# Patient Record
Sex: Male | Born: 1935 | Race: White | Hispanic: No | Marital: Married | Smoking: Current every day smoker
Health system: Southern US, Community
[De-identification: ages and names within clinical notes are randomized; demographics above are authoritative.]

## PROBLEM LIST (undated history)

## (undated) DIAGNOSIS — K209 Esophagitis, unspecified without bleeding: Secondary | ICD-10-CM

## (undated) DIAGNOSIS — R42 Dizziness and giddiness: Secondary | ICD-10-CM

## (undated) DIAGNOSIS — J439 Emphysema, unspecified: Secondary | ICD-10-CM

## (undated) DIAGNOSIS — J302 Other seasonal allergic rhinitis: Secondary | ICD-10-CM

## (undated) DIAGNOSIS — I4729 Other ventricular tachycardia: Secondary | ICD-10-CM

## (undated) DIAGNOSIS — K219 Gastro-esophageal reflux disease without esophagitis: Secondary | ICD-10-CM

## (undated) DIAGNOSIS — I499 Cardiac arrhythmia, unspecified: Secondary | ICD-10-CM

## (undated) DIAGNOSIS — F32A Depression, unspecified: Secondary | ICD-10-CM

## (undated) DIAGNOSIS — F419 Anxiety disorder, unspecified: Secondary | ICD-10-CM

## (undated) DIAGNOSIS — M5126 Other intervertebral disc displacement, lumbar region: Secondary | ICD-10-CM

## (undated) DIAGNOSIS — F4320 Adjustment disorder, unspecified: Secondary | ICD-10-CM

## (undated) DIAGNOSIS — I472 Ventricular tachycardia: Secondary | ICD-10-CM

## (undated) DIAGNOSIS — F329 Major depressive disorder, single episode, unspecified: Secondary | ICD-10-CM

## (undated) DIAGNOSIS — C679 Malignant neoplasm of bladder, unspecified: Secondary | ICD-10-CM

## (undated) HISTORY — PX: COLONOSCOPY: SHX174

## (undated) HISTORY — DX: Major depressive disorder, single episode, unspecified: F32.9

## (undated) HISTORY — DX: Gastro-esophageal reflux disease without esophagitis: K21.9

## (undated) HISTORY — DX: Esophagitis, unspecified without bleeding: K20.90

## (undated) HISTORY — PX: BLADDER SURGERY: SHX569

## (undated) HISTORY — DX: Esophagitis, unspecified: K20.9

## (undated) HISTORY — DX: Depression, unspecified: F32.A

## (undated) HISTORY — DX: Dizziness and giddiness: R42

## (undated) HISTORY — DX: Anxiety disorder, unspecified: F41.9

## (undated) HISTORY — DX: Adjustment disorder, unspecified: F43.20

## (undated) HISTORY — DX: Malignant neoplasm of bladder, unspecified: C67.9

## (undated) HISTORY — PX: VEIN SURGERY: SHX48

---

## 1997-07-31 ENCOUNTER — Ambulatory Visit (HOSPITAL_COMMUNITY): Admission: RE | Admit: 1997-07-31 | Discharge: 1997-07-31 | Payer: Self-pay | Admitting: Urology

## 1998-02-26 ENCOUNTER — Other Ambulatory Visit: Admission: RE | Admit: 1998-02-26 | Discharge: 1998-02-26 | Payer: Self-pay | Admitting: Urology

## 1999-04-06 ENCOUNTER — Other Ambulatory Visit: Admission: RE | Admit: 1999-04-06 | Discharge: 1999-04-06 | Payer: Self-pay | Admitting: Urology

## 1999-04-13 ENCOUNTER — Encounter: Payer: Self-pay | Admitting: Urology

## 1999-04-13 ENCOUNTER — Encounter: Admission: RE | Admit: 1999-04-13 | Discharge: 1999-04-13 | Payer: Self-pay | Admitting: Urology

## 1999-07-27 ENCOUNTER — Encounter: Payer: Self-pay | Admitting: Urology

## 1999-07-27 ENCOUNTER — Encounter: Admission: RE | Admit: 1999-07-27 | Discharge: 1999-07-27 | Payer: Self-pay | Admitting: Urology

## 2005-02-23 ENCOUNTER — Ambulatory Visit (HOSPITAL_COMMUNITY): Admission: RE | Admit: 2005-02-23 | Discharge: 2005-02-23 | Payer: Self-pay | Admitting: Urology

## 2014-12-24 DIAGNOSIS — K5909 Other constipation: Secondary | ICD-10-CM | POA: Diagnosis not present

## 2015-03-05 DIAGNOSIS — Z936 Other artificial openings of urinary tract status: Secondary | ICD-10-CM | POA: Diagnosis not present

## 2015-05-27 DIAGNOSIS — Z79899 Other long term (current) drug therapy: Secondary | ICD-10-CM | POA: Diagnosis not present

## 2015-05-27 DIAGNOSIS — I480 Paroxysmal atrial fibrillation: Secondary | ICD-10-CM | POA: Diagnosis not present

## 2015-05-27 DIAGNOSIS — Z7901 Long term (current) use of anticoagulants: Secondary | ICD-10-CM | POA: Diagnosis not present

## 2015-06-23 DIAGNOSIS — D0421 Carcinoma in situ of skin of right ear and external auricular canal: Secondary | ICD-10-CM | POA: Diagnosis not present

## 2015-06-23 DIAGNOSIS — B351 Tinea unguium: Secondary | ICD-10-CM | POA: Diagnosis not present

## 2015-06-23 DIAGNOSIS — C44329 Squamous cell carcinoma of skin of other parts of face: Secondary | ICD-10-CM | POA: Diagnosis not present

## 2015-06-23 DIAGNOSIS — L570 Actinic keratosis: Secondary | ICD-10-CM | POA: Diagnosis not present

## 2015-07-16 DIAGNOSIS — A932 Colorado tick fever: Secondary | ICD-10-CM | POA: Diagnosis not present

## 2015-07-16 DIAGNOSIS — R5381 Other malaise: Secondary | ICD-10-CM | POA: Diagnosis not present

## 2015-08-14 DIAGNOSIS — Z936 Other artificial openings of urinary tract status: Secondary | ICD-10-CM | POA: Diagnosis not present

## 2015-08-20 DIAGNOSIS — R109 Unspecified abdominal pain: Secondary | ICD-10-CM | POA: Diagnosis not present

## 2015-08-20 DIAGNOSIS — N3001 Acute cystitis with hematuria: Secondary | ICD-10-CM | POA: Diagnosis not present

## 2015-08-20 DIAGNOSIS — R0602 Shortness of breath: Secondary | ICD-10-CM | POA: Diagnosis not present

## 2015-08-20 DIAGNOSIS — R69 Illness, unspecified: Secondary | ICD-10-CM | POA: Diagnosis not present

## 2015-08-20 DIAGNOSIS — R1084 Generalized abdominal pain: Secondary | ICD-10-CM | POA: Diagnosis not present

## 2015-08-20 DIAGNOSIS — S3991XA Unspecified injury of abdomen, initial encounter: Secondary | ICD-10-CM | POA: Diagnosis not present

## 2015-08-20 DIAGNOSIS — S2242XA Multiple fractures of ribs, left side, initial encounter for closed fracture: Secondary | ICD-10-CM | POA: Diagnosis not present

## 2015-09-01 DIAGNOSIS — S2232XS Fracture of one rib, left side, sequela: Secondary | ICD-10-CM | POA: Diagnosis not present

## 2015-09-01 DIAGNOSIS — G4700 Insomnia, unspecified: Secondary | ICD-10-CM | POA: Diagnosis not present

## 2015-09-01 DIAGNOSIS — N390 Urinary tract infection, site not specified: Secondary | ICD-10-CM | POA: Diagnosis not present

## 2015-09-01 DIAGNOSIS — Z1389 Encounter for screening for other disorder: Secondary | ICD-10-CM | POA: Diagnosis not present

## 2015-09-01 DIAGNOSIS — Z9181 History of falling: Secondary | ICD-10-CM | POA: Diagnosis not present

## 2015-09-01 DIAGNOSIS — J40 Bronchitis, not specified as acute or chronic: Secondary | ICD-10-CM | POA: Diagnosis not present

## 2015-09-01 DIAGNOSIS — K219 Gastro-esophageal reflux disease without esophagitis: Secondary | ICD-10-CM | POA: Diagnosis not present

## 2015-09-09 DIAGNOSIS — R079 Chest pain, unspecified: Secondary | ICD-10-CM | POA: Diagnosis not present

## 2015-09-09 DIAGNOSIS — I480 Paroxysmal atrial fibrillation: Secondary | ICD-10-CM | POA: Diagnosis not present

## 2015-09-09 DIAGNOSIS — Z79899 Other long term (current) drug therapy: Secondary | ICD-10-CM | POA: Diagnosis not present

## 2015-09-09 DIAGNOSIS — Z7901 Long term (current) use of anticoagulants: Secondary | ICD-10-CM | POA: Diagnosis not present

## 2015-09-30 DIAGNOSIS — R079 Chest pain, unspecified: Secondary | ICD-10-CM | POA: Diagnosis not present

## 2015-10-01 DIAGNOSIS — R1032 Left lower quadrant pain: Secondary | ICD-10-CM | POA: Diagnosis not present

## 2015-10-01 DIAGNOSIS — R1013 Epigastric pain: Secondary | ICD-10-CM | POA: Diagnosis not present

## 2015-10-01 DIAGNOSIS — K5900 Constipation, unspecified: Secondary | ICD-10-CM | POA: Diagnosis not present

## 2015-10-02 DIAGNOSIS — H401132 Primary open-angle glaucoma, bilateral, moderate stage: Secondary | ICD-10-CM | POA: Diagnosis not present

## 2015-10-02 DIAGNOSIS — H5212 Myopia, left eye: Secondary | ICD-10-CM | POA: Diagnosis not present

## 2015-10-02 DIAGNOSIS — H35373 Puckering of macula, bilateral: Secondary | ICD-10-CM | POA: Diagnosis not present

## 2015-10-09 DIAGNOSIS — K5900 Constipation, unspecified: Secondary | ICD-10-CM | POA: Diagnosis not present

## 2015-10-09 DIAGNOSIS — K621 Rectal polyp: Secondary | ICD-10-CM | POA: Diagnosis not present

## 2015-10-09 DIAGNOSIS — D126 Benign neoplasm of colon, unspecified: Secondary | ICD-10-CM | POA: Diagnosis not present

## 2015-10-09 DIAGNOSIS — R69 Illness, unspecified: Secondary | ICD-10-CM | POA: Diagnosis not present

## 2015-10-09 DIAGNOSIS — K5730 Diverticulosis of large intestine without perforation or abscess without bleeding: Secondary | ICD-10-CM | POA: Diagnosis not present

## 2015-10-09 DIAGNOSIS — Z8551 Personal history of malignant neoplasm of bladder: Secondary | ICD-10-CM | POA: Diagnosis not present

## 2015-10-09 DIAGNOSIS — Z7982 Long term (current) use of aspirin: Secondary | ICD-10-CM | POA: Diagnosis not present

## 2015-10-09 DIAGNOSIS — R634 Abnormal weight loss: Secondary | ICD-10-CM | POA: Diagnosis not present

## 2015-10-09 DIAGNOSIS — D128 Benign neoplasm of rectum: Secondary | ICD-10-CM | POA: Diagnosis not present

## 2015-10-09 DIAGNOSIS — R1310 Dysphagia, unspecified: Secondary | ICD-10-CM | POA: Diagnosis not present

## 2015-10-09 DIAGNOSIS — R1031 Right lower quadrant pain: Secondary | ICD-10-CM | POA: Diagnosis not present

## 2015-10-09 DIAGNOSIS — Z79899 Other long term (current) drug therapy: Secondary | ICD-10-CM | POA: Diagnosis not present

## 2015-10-09 DIAGNOSIS — D124 Benign neoplasm of descending colon: Secondary | ICD-10-CM | POA: Diagnosis not present

## 2015-10-28 DIAGNOSIS — Z0000 Encounter for general adult medical examination without abnormal findings: Secondary | ICD-10-CM | POA: Diagnosis not present

## 2015-10-28 DIAGNOSIS — M549 Dorsalgia, unspecified: Secondary | ICD-10-CM | POA: Diagnosis not present

## 2015-10-28 DIAGNOSIS — N281 Cyst of kidney, acquired: Secondary | ICD-10-CM | POA: Diagnosis not present

## 2015-10-28 DIAGNOSIS — N3090 Cystitis, unspecified without hematuria: Secondary | ICD-10-CM | POA: Diagnosis not present

## 2015-10-28 DIAGNOSIS — Z23 Encounter for immunization: Secondary | ICD-10-CM | POA: Diagnosis not present

## 2015-10-28 DIAGNOSIS — Z720 Tobacco use: Secondary | ICD-10-CM | POA: Diagnosis not present

## 2015-10-30 DIAGNOSIS — N281 Cyst of kidney, acquired: Secondary | ICD-10-CM | POA: Diagnosis not present

## 2015-11-13 DIAGNOSIS — H35373 Puckering of macula, bilateral: Secondary | ICD-10-CM | POA: Diagnosis not present

## 2015-11-13 DIAGNOSIS — H401132 Primary open-angle glaucoma, bilateral, moderate stage: Secondary | ICD-10-CM | POA: Diagnosis not present

## 2015-11-13 DIAGNOSIS — Z961 Presence of intraocular lens: Secondary | ICD-10-CM | POA: Diagnosis not present

## 2015-12-02 DIAGNOSIS — Z79899 Other long term (current) drug therapy: Secondary | ICD-10-CM | POA: Diagnosis not present

## 2015-12-02 DIAGNOSIS — I480 Paroxysmal atrial fibrillation: Secondary | ICD-10-CM | POA: Diagnosis not present

## 2015-12-02 DIAGNOSIS — Z23 Encounter for immunization: Secondary | ICD-10-CM | POA: Diagnosis not present

## 2015-12-04 DIAGNOSIS — H01009 Unspecified blepharitis unspecified eye, unspecified eyelid: Secondary | ICD-10-CM | POA: Diagnosis not present

## 2015-12-25 DIAGNOSIS — H01009 Unspecified blepharitis unspecified eye, unspecified eyelid: Secondary | ICD-10-CM | POA: Diagnosis not present

## 2016-01-27 DIAGNOSIS — J209 Acute bronchitis, unspecified: Secondary | ICD-10-CM | POA: Diagnosis not present

## 2016-02-04 DIAGNOSIS — J329 Chronic sinusitis, unspecified: Secondary | ICD-10-CM | POA: Diagnosis not present

## 2016-02-04 DIAGNOSIS — M1990 Unspecified osteoarthritis, unspecified site: Secondary | ICD-10-CM | POA: Diagnosis not present

## 2016-02-04 DIAGNOSIS — Z0000 Encounter for general adult medical examination without abnormal findings: Secondary | ICD-10-CM | POA: Diagnosis not present

## 2016-02-04 DIAGNOSIS — J40 Bronchitis, not specified as acute or chronic: Secondary | ICD-10-CM | POA: Diagnosis not present

## 2016-06-01 DIAGNOSIS — Z79899 Other long term (current) drug therapy: Secondary | ICD-10-CM | POA: Diagnosis not present

## 2016-06-01 DIAGNOSIS — I480 Paroxysmal atrial fibrillation: Secondary | ICD-10-CM | POA: Diagnosis not present

## 2016-06-23 DIAGNOSIS — H401132 Primary open-angle glaucoma, bilateral, moderate stage: Secondary | ICD-10-CM | POA: Diagnosis not present

## 2016-06-23 DIAGNOSIS — H01009 Unspecified blepharitis unspecified eye, unspecified eyelid: Secondary | ICD-10-CM | POA: Diagnosis not present

## 2016-06-23 DIAGNOSIS — H35373 Puckering of macula, bilateral: Secondary | ICD-10-CM | POA: Diagnosis not present

## 2016-06-23 DIAGNOSIS — Z936 Other artificial openings of urinary tract status: Secondary | ICD-10-CM | POA: Diagnosis not present

## 2016-08-19 ENCOUNTER — Encounter: Payer: Self-pay | Admitting: Cardiology

## 2016-08-23 ENCOUNTER — Encounter: Payer: Self-pay | Admitting: Cardiology

## 2016-08-23 DIAGNOSIS — Z79899 Other long term (current) drug therapy: Secondary | ICD-10-CM | POA: Insufficient documentation

## 2016-08-23 DIAGNOSIS — R079 Chest pain, unspecified: Secondary | ICD-10-CM

## 2016-08-23 DIAGNOSIS — I480 Paroxysmal atrial fibrillation: Secondary | ICD-10-CM

## 2016-08-23 DIAGNOSIS — R0789 Other chest pain: Secondary | ICD-10-CM | POA: Insufficient documentation

## 2016-08-23 HISTORY — DX: Other long term (current) drug therapy: Z79.899

## 2016-08-23 HISTORY — DX: Chest pain, unspecified: R07.9

## 2016-08-23 HISTORY — DX: Paroxysmal atrial fibrillation: I48.0

## 2016-08-24 ENCOUNTER — Ambulatory Visit (INDEPENDENT_AMBULATORY_CARE_PROVIDER_SITE_OTHER): Payer: Medicare HMO | Admitting: Cardiology

## 2016-08-24 ENCOUNTER — Encounter: Payer: Self-pay | Admitting: Cardiology

## 2016-08-24 ENCOUNTER — Ambulatory Visit (HOSPITAL_BASED_OUTPATIENT_CLINIC_OR_DEPARTMENT_OTHER)
Admission: RE | Admit: 2016-08-24 | Discharge: 2016-08-24 | Disposition: A | Discharge status: Home / Self Care | Payer: Medicare HMO | Source: Ambulatory Visit | Attending: Cardiology | Admitting: Cardiology

## 2016-08-24 VITALS — BP 92/68 | HR 62 | Ht 73.0 in | Wt 170.8 lb

## 2016-08-24 DIAGNOSIS — I700 Atherosclerosis of aorta: Secondary | ICD-10-CM | POA: Insufficient documentation

## 2016-08-24 DIAGNOSIS — R079 Chest pain, unspecified: Secondary | ICD-10-CM | POA: Diagnosis not present

## 2016-08-24 DIAGNOSIS — Z79899 Other long term (current) drug therapy: Secondary | ICD-10-CM | POA: Diagnosis not present

## 2016-08-24 DIAGNOSIS — R918 Other nonspecific abnormal finding of lung field: Secondary | ICD-10-CM | POA: Diagnosis not present

## 2016-08-24 DIAGNOSIS — I480 Paroxysmal atrial fibrillation: Secondary | ICD-10-CM | POA: Diagnosis not present

## 2016-08-24 NOTE — Patient Instructions (Addendum)
Medication Instructions:  Your physician recommends that you continue on your current medications as directed. Please refer to the Current Medication list given to you today.   Labwork: Your physician recommends that you return for lab work in: today. Troponin. Dr. Bettina Gavia will call you with these results.   Testing/Procedures: A chest x-ray takes a picture of the organs and structures inside the chest, including the heart, lungs, and blood vessels. This test can show several things, including, whether the heart is enlarges; whether fluid is building up in the lungs; and whether pacemaker / defibrillator leads are still in place.  Your physician has requested that you have a lexiscan myoview. For further information please visit [email or web address]. Please follow instruction sheet, as given.   You will have this done at [facility].    Follow-Up: Your physician recommends that you schedule a follow-up appointment in: 2 weeks.   Any Other Special Instructions Will Be Listed Below (If Applicable).     If you need a refill on your cardiac medications before your next appointment, please call your pharmacy.

## 2016-08-24 NOTE — Progress Notes (Signed)
Cardiology Office Note:    Date:  08/24/2016   ID:  Wayne Mcguire, DOB [birth year], MRN [id number]  PCP:  Wayne Broker, MD  Cardiologist:  Wayne More, MD    Referring MD: No ref. provider found    ASSESSMENT:    1. Chest pain in adult   2. PAF (paroxysmal atrial fibrillation) ([place])   3. High risk medication use    PLAN:    In order of problems listed above:  1. Atypical chest pain undergo quick evaluation including chest x-ray troponin is normal be set up for myocardial perfusion study in the next days. Symptoms worsen or present to the emergency room he has nitroglycerin to take in a when necessary basis and is on low-dose aspirin. 2. Stable continue current antiarrhythmic drug, low-dose if troponin is positive he'll stop the drug immediately  Next appointment: 2 weeks   Medication Adjustments/Labs and Tests Ordered: Current medicines are reviewed at length with the patient today.  Concerns regarding medicines are outlined above.  Orders Placed This Encounter  Procedures  . DG Chest 2 View  . Troponin I  . Myocardial Perfusion Imaging   No orders of the defined types were placed in this encounter.   Chief Complaint  Patient presents with  . Chest Pain    Pt has c/o CP x 1 week    History of Present Illness:    Wayne Mcguire is a 83 y.o. male with a hx of paroxysmal Atrial Fibrillation CHADS2 vasc score of 2 on flecanide last seen 3 months ago. Overall he has done well with tinnitus had little or no episodes of rapid heart rhythm. He has a background history of chest pain and underwent a stress echo in August 2017 that was normal and in the last 1-2 weeks has had Mcguire frequent episodes of nonexertional aching in the left chest and relieved with rest but was relieved when he took nitroglycerin on one instance. The symptoms can last for hours not positional not sharp but he does find himself to be Mcguire short of breath. For further evaluation he'll undergo EKG done  no acute ischemic changes chest x-ray with decreased breath sounds of left chest and ongoing cigarette smoking and a stat troponin to be drawn and called to me. If chest x-ray and troponin are normal he'll be scheduled for myocardial perfusion study. He's had no fever or chills and no chest wall trauma. The chest discomfort does radiate somewhat towards the left shoulder but not into the left arm. He has had no diaphoresis nausea or vomiting Compliance with diet, lifestyle and medications: Yes Past Medical History:  Diagnosis Date  . Adult situational stress disorder   . Anxiety   . Bladder cancer ([place])   . Depression   . Esophagitis   . GERD (gastroesophageal reflux disease)     Past Surgical History:  Procedure Laterality Date  . BLADDER SURGERY    . VEIN SURGERY      Current Medications: Current Meds  Medication Sig  . aspirin EC 81 MG tablet Take 81 mg by mouth daily.  . flecainide (TAMBOCOR) 50 MG tablet Take 50 mg by mouth 2 (two) times daily.  . meloxicam (MOBIC) 15 MG tablet Take 15 mg by mouth daily.  . nitroGLYCERIN (NITROSTAT) 0.4 MG SL tablet Place 0.4 mg under the tongue daily as needed.  . pantoprazole (PROTONIX) 40 MG tablet Take 40 mg by mouth daily.  . traZODone (DESYREL) 150 MG tablet Take 150  mg by mouth daily.     Allergies:   Patient has no known allergies.   Social History   Social History  . Marital status: Married    Spouse name: N/A  . Number of children: N/A  . Years of education: N/A   Social History Main Topics  . Smoking status: Current Every Day Smoker    Packs/day: 1.00    Types: Cigarettes  . Smokeless tobacco: Former Systems developer    Types: Chew  . Alcohol use None  . Drug use: Unknown  . Sexual activity: Not Asked   Other Topics Concern  . None   Social History Narrative  . None     Family History: The patient's family history includes Heart attack in his brother. ROS:   Please see the history of present illness.    All other  systems reviewed and are negative.  EKGs/Labs/Other Studies Reviewed:    The following studies were reviewed today:   EKG:  EKG is  ordered today.  The ekg ordered today demonstrates abnormal R wave progression V2 to v3 no acute ischemic changes    Physical Exam:    VS:  BP 92/68   Pulse 62   Ht 6\' 1"  (1.854 m)   Wt 170 lb 12.8 oz (77.5 kg)   SpO2 96%   BMI 22.53 kg/m     Wt Readings from Last 3 Encounters:  08/24/16 170 lb 12.8 oz (77.5 kg)     GEN:  Well nourished, well developed in no acute distress HEENT: Normal NECK: No JVD; No carotid bruits LYMPHATICS: No lymphadenopathy CARDIAC: RRR, no murmurs, rubs, gallops RESPIRATORY:  Decreased BS l chest  ABDOMEN: Soft, non-tender, non-distended MUSCULOSKELETAL:  No edema; No deformity  SKIN: Warm and dry NEUROLOGIC:  Alert and oriented x 3 PSYCHIATRIC:  Normal affect    Signed, Wayne More, MD  08/24/2016 4:30 PM    [facility]

## 2016-08-25 LAB — TROPONIN T: TROPONIN T TROPT: 0.01 ng/mL (ref <0.011)

## 2016-08-31 ENCOUNTER — Encounter: Payer: Self-pay | Admitting: Cardiology

## 2016-08-31 DIAGNOSIS — R079 Chest pain, unspecified: Secondary | ICD-10-CM | POA: Diagnosis not present

## 2016-09-07 ENCOUNTER — Ambulatory Visit (INDEPENDENT_AMBULATORY_CARE_PROVIDER_SITE_OTHER): Payer: Medicare HMO | Admitting: Cardiology

## 2016-09-07 VITALS — BP 92/54 | HR 62 | Ht 73.0 in | Wt 174.0 lb

## 2016-09-07 DIAGNOSIS — Z79899 Other long term (current) drug therapy: Secondary | ICD-10-CM | POA: Diagnosis not present

## 2016-09-07 DIAGNOSIS — I480 Paroxysmal atrial fibrillation: Secondary | ICD-10-CM

## 2016-09-07 DIAGNOSIS — R0789 Other chest pain: Secondary | ICD-10-CM

## 2016-09-07 DIAGNOSIS — R071 Chest pain on breathing: Secondary | ICD-10-CM | POA: Diagnosis not present

## 2016-09-07 DIAGNOSIS — R079 Chest pain, unspecified: Secondary | ICD-10-CM | POA: Diagnosis not present

## 2016-09-07 HISTORY — DX: Other chest pain: R07.89

## 2016-09-07 NOTE — Patient Instructions (Signed)
Medication Instructions:  Your physician recommends that you continue on your current medications as directed. Please refer to the Current Medication list given to you today.   Labwork: None  Testing/Procedures: You had an EKG today.   Follow-Up: Your physician wants you to follow-up in: 3 months. You will receive a reminder letter in the mail two months in advance. If you don't receive a letter, please call our office to schedule the follow-up appointment.   Any Other Special Instructions Will Be Listed Below (If Applicable).     If you need a refill on your cardiac medications before your next appointment, please call your pharmacy.   

## 2016-09-07 NOTE — Progress Notes (Signed)
Cardiology Office Note:    Date:  09/07/2016   ID:  Wayne Mcguire, DOB 04/12/1932, MRN [id number]  PCP:  [name], MD  Cardiologist:  Shirlee More, MD    Referring MD: [name], MD    ASSESSMENT:    1. Costochondral chest pain   2. PAF (paroxysmal atrial fibrillation) ([place])   3. High risk medication use   4. Chest pain in adult    PLAN:    In order of problems listed above:  1. Improved, he is intolerant of nonsteroidals will take Tylenol twice daily and will observe his clinical pattern. 2. Stable no recurrence continue his antiarrhythmic drug and aspirin. 3. Stable no evidence of flecainide toxicity   Next appointment: 3 months   Medication Adjustments/Labs and Tests Ordered: Current medicines are reviewed at length with the patient today.  Concerns regarding medicines are outlined above.  Orders Placed This Encounter  Procedures  . EKG 12-Lead   No orders of the defined types were placed in this encounter.   Chief Complaint  Patient presents with  . Atrial Fibrillation  . Chest Pain    Pt had recent stress test at [facility]  . Leg Swelling    left foot and ankle    History of Present Illness:    Wayne Mcguire is a 83 y.o. male with a hx of paroxysmal Atrial Fibrillation CHADS2 vasc score of 2 on flecanide  last seen 72months ago. He was seen in the office for chest pain troponin was normal chest x-ray unremarkable and stress echo was normal. He continues to have infrequent sharp localized left costochondral pain nonanginal in nature and is associated scoliosis and pectus excavatum deformity. He remains apprehensive regarding underlying ischemic heart disease and workup undertaken approach clinical observation and reassessment in 3 months. His symptoms more typical he require further evaluation such as coronary angiography. He's had no breakthrough episodes of rapid heart rhythm shortness of breath palpitation or syncope. Compliance with diet,  lifestyle and medications: Yes Past Medical History:  Diagnosis Date  . Adult situational stress disorder   . Anxiety   . Bladder cancer ([place])   . Depression   . Esophagitis   . GERD (gastroesophageal reflux disease)     Past Surgical History:  Procedure Laterality Date  . BLADDER SURGERY    . VEIN SURGERY      Current Medications: Current Meds  Medication Sig  . aspirin EC 81 MG tablet Take 81 mg by mouth daily.  . flecainide (TAMBOCOR) 50 MG tablet Take 50 mg by mouth 2 (two) times daily.  . nitroGLYCERIN (NITROSTAT) 0.4 MG SL tablet Place 0.4 mg under the tongue every 5 (five) minutes as needed for chest pain.   . pantoprazole (PROTONIX) 40 MG tablet Take 40 mg by mouth daily.  . traZODone (DESYREL) 150 MG tablet Take 150 mg by mouth daily.     Allergies:   Patient has no known allergies.   Social History   Social History  . Marital status: Married    Spouse name: N/A  . Number of children: N/A  . Years of education: N/A   Social History Main Topics  . Smoking status: Current Every Day Smoker    Packs/day: 1.00    Types: Cigarettes  . Smokeless tobacco: Former Systems developer    Types: Chew  . Alcohol use Not on file  . Drug use: Unknown  . Sexual activity: Not on file   Other Topics Concern  .  Not on file   Social History Narrative  . No narrative on file     Family History: The patient's family history includes Heart attack in his brother. ROS:   Please see the history of present illness.    All other systems reviewed and are negative.  EKGs/Labs/Other Studies Reviewed:    The following studies were reviewed today:  EKG:  EKG ordered today.  The ekg ordered today demonstrates [place] normal Stress echo recently without ischemia. Recent Labs: No results found for requested labs within last 8760 hours.  Recent Lipid Panel No results found for: CHOL, TRIG, HDL, CHOLHDL, VLDL, LDLCALC, LDLDIRECT  Physical Exam:    VS:  BP (!) 92/54   Pulse 62   Ht 6\' 1"   (1.854 m)   Wt 174 lb (78.9 kg)   SpO2 94%   BMI 22.96 kg/m     Wt Readings from Last 3 Encounters:  09/07/16 174 lb (78.9 kg)  08/24/16 170 lb 12.8 oz (77.5 kg)     GEN:  Well nourished, well developed in no acute distress HEENT: Normal NECK: No JVD; No carotid bruits LYMPHATICS: No lymphadenopathy CARDIAC: RRR, no murmurs, rubs, gallops RESPIRATORY:  Clear to auscultation without rales, wheezing or rhonchi  ABDOMEN: Soft, non-tender, non-distended MUSCULOSKELETAL:  No edema; No deformity  SKIN: Warm and dry NEUROLOGIC:  Alert and oriented x 3 PSYCHIATRIC:  Normal affect    Signed, Shirlee More, MD  09/07/2016 4:54 PM    [facility]

## 2016-09-09 DIAGNOSIS — I480 Paroxysmal atrial fibrillation: Secondary | ICD-10-CM | POA: Diagnosis not present

## 2016-09-09 DIAGNOSIS — F5104 Psychophysiologic insomnia: Secondary | ICD-10-CM

## 2016-09-09 DIAGNOSIS — F419 Anxiety disorder, unspecified: Secondary | ICD-10-CM

## 2016-09-09 DIAGNOSIS — B351 Tinea unguium: Secondary | ICD-10-CM | POA: Diagnosis not present

## 2016-09-09 DIAGNOSIS — R69 Illness, unspecified: Secondary | ICD-10-CM | POA: Diagnosis not present

## 2016-09-09 DIAGNOSIS — K219 Gastro-esophageal reflux disease without esophagitis: Secondary | ICD-10-CM

## 2016-09-09 HISTORY — DX: Anxiety disorder, unspecified: F41.9

## 2016-09-09 HISTORY — DX: Psychophysiologic insomnia: F51.04

## 2016-09-09 HISTORY — DX: Gastro-esophageal reflux disease without esophagitis: K21.9

## 2016-09-21 ENCOUNTER — Ambulatory Visit: Payer: Medicare HMO | Admitting: Cardiology

## 2016-09-29 DIAGNOSIS — W57XXXA Bitten or stung by nonvenomous insect and other nonvenomous arthropods, initial encounter: Secondary | ICD-10-CM | POA: Diagnosis not present

## 2016-09-29 DIAGNOSIS — B351 Tinea unguium: Secondary | ICD-10-CM | POA: Diagnosis not present

## 2016-09-29 DIAGNOSIS — S70361A Insect bite (nonvenomous), right thigh, initial encounter: Secondary | ICD-10-CM | POA: Diagnosis not present

## 2016-10-05 ENCOUNTER — Encounter: Payer: Self-pay | Admitting: Cardiology

## 2016-10-05 ENCOUNTER — Ambulatory Visit (INDEPENDENT_AMBULATORY_CARE_PROVIDER_SITE_OTHER): Payer: Medicare HMO | Admitting: Cardiology

## 2016-10-05 VITALS — BP 120/56 | HR 60 | Resp 12 | Ht 73.0 in | Wt 176.8 lb

## 2016-10-05 DIAGNOSIS — I480 Paroxysmal atrial fibrillation: Secondary | ICD-10-CM

## 2016-10-05 DIAGNOSIS — H5213 Myopia, bilateral: Secondary | ICD-10-CM | POA: Diagnosis not present

## 2016-10-05 DIAGNOSIS — H35373 Puckering of macula, bilateral: Secondary | ICD-10-CM | POA: Diagnosis not present

## 2016-10-05 DIAGNOSIS — H401132 Primary open-angle glaucoma, bilateral, moderate stage: Secondary | ICD-10-CM | POA: Diagnosis not present

## 2016-10-05 MED ORDER — NITROGLYCERIN 0.4 MG SL SUBL: 0.4000 mg | SUBLINGUAL_TABLET | SUBLINGUAL | 1 refills | Status: DC | PRN

## 2016-10-05 MED ORDER — APIXABAN 5 MG PO TABS: 5.0000 mg | ORAL_TABLET | Freq: Two times a day (BID) | ORAL | 2 refills | Status: DC

## 2016-10-05 NOTE — Progress Notes (Signed)
Cardiology Office Note:    Date:  10/05/2016   ID:  Wayne Mcguire, DOB 01/19/1933, MRN [id number]  PCP:  [name], MD  Cardiologist:  Shirlee More, MD    Referring MD: [name], MD    ASSESSMENT:    1. PAF (paroxysmal atrial fibrillation) ([facility])    PLAN:    In order of problems listed above:  1. Clinically he is having breakthrough episodes of atrial fibrillation associated with antifungal therapy. He's agreed to accept anticoagulation at least over the intermediate term continue flecainide and assess response. All   Next appointment: 6 weeks   Medication Adjustments/Labs and Tests Ordered: Current medicines are reviewed at length with the patient today.  Concerns regarding medicines are outlined above.  Orders Placed This Encounter  Procedures  . EKG 12-Lead   Meds ordered this encounter  Medications  . nitroGLYCERIN (NITROSTAT) 0.4 MG SL tablet    Sig: Place 1 tablet (0.4 mg total) under the tongue every 5 (five) minutes as needed for chest pain.    Dispense:  25 tablet    Refill:  1  . apixaban (ELIQUIS) 5 MG TABS tablet    Sig: Take 1 tablet (5 mg total) by mouth 2 (two) times daily.    Dispense:  60 tablet    Refill:  2    Chief Complaint  Patient presents with  . Chest Pain  . Leg Swelling  . Irregular heart rate    History of Present Illness:    Wayne Mcguire is a 83 y.o. male with a hx of PAF  last seen last month .He was started on terbinafine and was in AF for 2 weeks.During that time he felt badly complaints of shortness of breath and exercise intolerance and chest pain have resolved.  Compliance with diet, lifestyle and medications: Yes  Past Medical History:  Diagnosis Date  . Adult situational stress disorder   . Anxiety   . Bladder cancer ([place])   . Depression   . Esophagitis   . GERD (gastroesophageal reflux disease)     Past Surgical History:  Procedure Laterality Date  . BLADDER SURGERY    . VEIN SURGERY       Current Medications: Current Meds  Medication Sig  . flecainide (TAMBOCOR) 50 MG tablet Take 50 mg by mouth 2 (two) times daily.  . meloxicam (MOBIC) 15 MG tablet Take 15 mg by mouth daily.  . nitroGLYCERIN (NITROSTAT) 0.4 MG SL tablet Place 1 tablet (0.4 mg total) under the tongue every 5 (five) minutes as needed for chest pain.  . pantoprazole (PROTONIX) 40 MG tablet Take 40 mg by mouth daily.  . traZODone (DESYREL) 150 MG tablet Take 150 mg by mouth daily.  . [DISCONTINUED] aspirin EC 81 MG tablet Take 81 mg by mouth daily.  . [DISCONTINUED] nitroGLYCERIN (NITROSTAT) 0.4 MG SL tablet Place 0.4 mg under the tongue every 5 (five) minutes as needed for chest pain.      Allergies:   Terbinafine and related   Social History   Social History  . Marital status: Married    Spouse name: N/A  . Number of children: N/A  . Years of education: N/A   Social History Main Topics  . Smoking status: Current Every Day Smoker    Packs/day: 0.25    Types: Cigarettes  . Smokeless tobacco: Never Used     Comment: Down to 5 cigs a day  . Alcohol use No  . Drug use:  No  . Sexual activity: Not Asked   Other Topics Concern  . None   Social History Narrative  . None     Family History: The patient's family history includes Heart attack in his brother; Heart failure in his father. ROS:   Please see the history of present illness.    All other systems reviewed and are negative.  EKGs/Labs/Other Studies Reviewed:    The following studies were reviewed today:  EKG:  EKG ordered today.  The ekg ordered today demonstrates Sinus rhythm normal  Recent Labs: No results found for requested labs within last 8760 hours.  Recent Lipid Panel No results found for: CHOL, TRIG, HDL, CHOLHDL, VLDL, LDLCALC, LDLDIRECT  Physical Exam:    VS:  BP (!) 120/56   Pulse 60   Resp 12   Ht 6\' 1"  (1.854 m)   Wt 176 lb 12.8 oz (80.2 kg)   BMI 23.33 kg/m     Wt Readings from Last 3 Encounters:   10/05/16 176 lb 12.8 oz (80.2 kg)  09/07/16 174 lb (78.9 kg)  08/24/16 170 lb 12.8 oz (77.5 kg)     GEN:  Well nourished, well developed in no acute distress HEENT: Normal NECK: No JVD; No carotid bruits LYMPHATICS: No lymphadenopathy CARDIAC: RRR, no murmurs, rubs, gallops RESPIRATORY:  Clear to auscultation without rales, wheezing or rhonchi  ABDOMEN: Soft, non-tender, non-distended MUSCULOSKELETAL:  Left lower extremity 1+ edema; No deformity  SKIN: Warm and dry NEUROLOGIC:  Alert and oriented x 3 PSYCHIATRIC:  Normal affect    Signed, Shirlee More, MD  10/05/2016 12:14 PM    [facility]

## 2016-10-05 NOTE — Patient Instructions (Addendum)
Medication Instructions:  Your physician has recommended you make the following change in your medication:  STOP aspirin START apixaban (Eliquis) 5 mg twice daily.   Labwork: None  Testing/Procedures: None  Follow-Up: Your physician recommends that you schedule a follow-up appointment in: 6 weeks.   Any Other Special Instructions Will Be Listed Below (If Applicable).     If you need a refill on your cardiac medications before your next appointment, please call your pharmacy.

## 2016-10-18 DIAGNOSIS — R8271 Bacteriuria: Secondary | ICD-10-CM | POA: Diagnosis not present

## 2016-10-18 DIAGNOSIS — R369 Urethral discharge, unspecified: Secondary | ICD-10-CM | POA: Diagnosis not present

## 2016-10-18 DIAGNOSIS — N509 Disorder of male genital organs, unspecified: Secondary | ICD-10-CM | POA: Diagnosis not present

## 2016-11-09 DIAGNOSIS — Z8551 Personal history of malignant neoplasm of bladder: Secondary | ICD-10-CM | POA: Diagnosis not present

## 2016-11-15 DIAGNOSIS — Z7901 Long term (current) use of anticoagulants: Secondary | ICD-10-CM

## 2016-11-15 HISTORY — DX: Long term (current) use of anticoagulants: Z79.01

## 2016-11-15 NOTE — Progress Notes (Signed)
Cardiology Office Note:    Date:  11/16/2016   ID:  Wayne Mcguire, DOB 02/18/1933, MRN [id number]  PCP:  [name], MD  Cardiologist:  Shirlee More, MD    Referring MD: [name], MD    ASSESSMENT:    1. PAF (paroxysmal atrial fibrillation) ([place])   2. High risk medication use   3. Chronic anticoagulation    PLAN:    In order of problems listed above:  1. Stable continue low-dose flecainide and anticoagulant 2. Stable continue his antiarrhythmic drug 3. Continue his current anticoagulant   Next appointment: 6 months   Medication Adjustments/Labs and Tests Ordered: Current medicines are reviewed at length with the patient today.  Concerns regarding medicines are outlined above.  No orders of the defined types were placed in this encounter.  No orders of the defined types were placed in this encounter.   Chief Complaint  Patient presents with  . Follow-up    6 week flup appt  . Shortness of Breath  . Edema    left foot/leg-better since last visit   . Atrial Fibrillation    History of Present Illness:    Wayne Mcguire is a 83 y.o. male with a hx of PAF and costochondral chest pain  last seen 6 weeks ago.he is compliant with medications lpleased with the quality of his life and is had no further episodes of atrial fibrillation, chest pain or syncope Compliance with diet, lifestyle and medications:  yes Past Medical History:  Diagnosis Date  . Adult situational stress disorder   . Anxiety   . Bladder cancer ([place])   . Depression   . Esophagitis   . GERD (gastroesophageal reflux disease)     Past Surgical History:  Procedure Laterality Date  . BLADDER SURGERY    . VEIN SURGERY      Current Medications: Current Meds  Medication Sig  . apixaban (ELIQUIS) 5 MG TABS tablet Take 1 tablet (5 mg total) by mouth 2 (two) times daily.  . ciclopirox (PENLAC) 8 % solution APPLY TOPICALLY NIGHTLY FOR 7 DAYS APPLY OVER NAIL AND SURROUNDING AREAS  .  flecainide (TAMBOCOR) 50 MG tablet Take 50 mg by mouth 2 (two) times daily.  . nitroGLYCERIN (NITROSTAT) 0.4 MG SL tablet Place 1 tablet (0.4 mg total) under the tongue every 5 (five) minutes as needed for chest pain.  . pantoprazole (PROTONIX) 40 MG tablet Take 40 mg by mouth daily.  . traZODone (DESYREL) 150 MG tablet Take 150 mg by mouth daily.     Allergies:   Terbinafine and related   Social History   Social History  . Marital status: Married    Spouse name: N/A  . Number of children: N/A  . Years of education: N/A   Social History Main Topics  . Smoking status: Current Every Day Smoker    Packs/day: 0.25    Types: Cigarettes  . Smokeless tobacco: Never Used     Comment: Down to 5 cigs a day  . Alcohol use No  . Drug use: No  . Sexual activity: Not Asked   Other Topics Concern  . None   Social History Narrative  . None     Family History: The patient's family history includes Heart attack in his brother; Heart failure in his father. ROS:   Please see the history of present illness.    All other systems reviewed and are negative.  EKGs/Labs/Other Studies Reviewed:    The following studies  were reviewed today:  EKG:  EKG ordered today.  The ekg ordered today demonstrates [place] 53 BPM normal  Recent Labs: No results found for requested labs within last 8760 hours.  Recent Lipid Panel No results found for: CHOL, TRIG, HDL, CHOLHDL, VLDL, LDLCALC, LDLDIRECT  Physical Exam:    VS:  BP 96/60   Pulse (!) 53   Ht 6\' 1"  (1.854 m)   Wt 174 lb 1.3 oz (79 kg)   SpO2 97%   BMI 22.97 kg/m     Wt Readings from Last 3 Encounters:  11/16/16 174 lb 1.3 oz (79 kg)  10/05/16 176 lb 12.8 oz (80.2 kg)  09/07/16 174 lb (78.9 kg)     GEN:  Well nourished, well developed in no acute distress HEENT: Normal NECK: No JVD; No carotid bruits LYMPHATICS: No lymphadenopathy CARDIAC: RRR, no murmurs, rubs, gallops RESPIRATORY:  Clear to auscultation without rales, wheezing or  rhonchi  ABDOMEN: Soft, non-tender, non-distended MUSCULOSKELETAL:  No edema; No deformity  SKIN: Warm and dry NEUROLOGIC:  Alert and oriented x 3 PSYCHIATRIC:  Normal affect    Signed, Shirlee More, MD  11/16/2016 3:06 PM    [facility]

## 2016-11-16 ENCOUNTER — Encounter: Payer: Self-pay | Admitting: Cardiology

## 2016-11-16 ENCOUNTER — Ambulatory Visit (INDEPENDENT_AMBULATORY_CARE_PROVIDER_SITE_OTHER): Payer: Medicare HMO | Admitting: Cardiology

## 2016-11-16 VITALS — BP 96/60 | HR 53 | Ht 73.0 in | Wt 174.1 lb

## 2016-11-16 DIAGNOSIS — Z7901 Long term (current) use of anticoagulants: Secondary | ICD-10-CM | POA: Diagnosis not present

## 2016-11-16 DIAGNOSIS — Z79899 Other long term (current) drug therapy: Secondary | ICD-10-CM

## 2016-11-16 DIAGNOSIS — I480 Paroxysmal atrial fibrillation: Secondary | ICD-10-CM | POA: Diagnosis not present

## 2016-11-16 NOTE — Patient Instructions (Signed)

## 2016-11-23 DIAGNOSIS — C679 Malignant neoplasm of bladder, unspecified: Secondary | ICD-10-CM | POA: Diagnosis not present

## 2016-11-23 DIAGNOSIS — K5730 Diverticulosis of large intestine without perforation or abscess without bleeding: Secondary | ICD-10-CM | POA: Diagnosis not present

## 2016-12-20 DIAGNOSIS — C679 Malignant neoplasm of bladder, unspecified: Secondary | ICD-10-CM | POA: Diagnosis not present

## 2016-12-20 DIAGNOSIS — D381 Neoplasm of uncertain behavior of trachea, bronchus and lung: Secondary | ICD-10-CM | POA: Diagnosis not present

## 2016-12-22 DIAGNOSIS — Z23 Encounter for immunization: Secondary | ICD-10-CM | POA: Diagnosis not present

## 2017-01-06 DIAGNOSIS — R042 Hemoptysis: Secondary | ICD-10-CM

## 2017-01-06 DIAGNOSIS — Z0000 Encounter for general adult medical examination without abnormal findings: Secondary | ICD-10-CM | POA: Diagnosis not present

## 2017-01-06 DIAGNOSIS — R911 Solitary pulmonary nodule: Secondary | ICD-10-CM | POA: Diagnosis not present

## 2017-01-06 DIAGNOSIS — R300 Dysuria: Secondary | ICD-10-CM

## 2017-01-06 DIAGNOSIS — J439 Emphysema, unspecified: Secondary | ICD-10-CM | POA: Diagnosis not present

## 2017-01-06 HISTORY — DX: Dysuria: R30.0

## 2017-01-06 HISTORY — DX: Hemoptysis: R04.2

## 2017-01-24 ENCOUNTER — Other Ambulatory Visit: Payer: Self-pay | Admitting: Cardiology

## 2017-01-24 DIAGNOSIS — I480 Paroxysmal atrial fibrillation: Secondary | ICD-10-CM

## 2017-02-17 DIAGNOSIS — Z936 Other artificial openings of urinary tract status: Secondary | ICD-10-CM | POA: Diagnosis not present

## 2017-08-08 ENCOUNTER — Other Ambulatory Visit: Payer: Self-pay | Admitting: Cardiology

## 2017-08-08 NOTE — Progress Notes (Signed)
Cardiology Office Note:    Date:  08/22/2017   ID:  Wayne Mcguire, DOB 1932-05-02, MRN [id number]  PCP:  [name], MD  Cardiologist:  Shirlee More, MD    Referring MD: [name], MD    ASSESSMENT:    1. PAF (paroxysmal atrial fibrillation) ([place])   2. High risk medication use   3. Costochondral chest pain    PLAN:    In order of problems listed above:  1. Stable he will continue very low-dose dose antiarrhythmic drug flecainide with no evidence of toxicity in his current anticoagulant.  EKG is stable today with sinus rhythm and a single APC 2. Stable continue low-dose flecainide I do not feel he needs a blood level.  I will check a CMP for potassium and liver function to screen for toxicity 3. Stable no recurrence   Next appointment: 6 months   Medication Adjustments/Labs and Tests Ordered: Current medicines are reviewed at length with the patient today.  Concerns regarding medicines are outlined above.  No orders of the defined types were placed in this encounter.  No orders of the defined types were placed in this encounter.   Chief Complaint  Patient presents with  . Follow-up  . Atrial Fibrillation  . Anticoagulation    History of Present Illness:     Past Medical History:  Diagnosis Date  . Adult situational stress disorder   . Anxiety   . Bladder cancer ([place])   . Depression   . Esophagitis   . GERD (gastroesophageal reflux disease)     Past Surgical History:  Procedure Laterality Date  . BLADDER SURGERY    . VEIN SURGERY      Current Medications: Current Meds  Medication Sig  . acetaminophen (TYLENOL 8 HOUR) 650 MG CR tablet Take 650 mg by mouth 2 (two) times daily.  Marland Kitchen ELIQUIS 5 MG TABS tablet TAKE ONE (1) TABLET BY MOUTH TWO (2) TIMES DAILY  . flecainide (TAMBOCOR) 50 MG tablet TAKE 1 TABLET BY MOUTH TWICE DAILY  . nitroGLYCERIN (NITROSTAT) 0.4 MG SL tablet Place 1 tablet (0.4 mg total) under the tongue every 5 (five) minutes  as needed for chest pain.  . traZODone (DESYREL) 150 MG tablet Take 150 mg by mouth daily.     Allergies:   Terbinafine and related   Social History   Socioeconomic History  . Marital status: Married    Spouse name: Not on file  . Number of children: Not on file  . Years of education: Not on file  . Highest education level: Not on file  Occupational History  . Not on file  Social Needs  . Financial resource strain: Not on file  . Food insecurity:    Worry: Not on file    Inability: Not on file  . Transportation needs:    Medical: Not on file    Non-medical: Not on file  Tobacco Use  . Smoking status: Current Every Day Smoker    Packs/day: 0.25    Types: Cigarettes  . Smokeless tobacco: Never Used  . Tobacco comment: Down to 5 cigs a day  Substance and Sexual Activity  . Alcohol use: No  . Drug use: No  . Sexual activity: Not on file  Lifestyle  . Physical activity:    Days per week: Not on file    Minutes per session: Not on file  . Stress: Not on file  Relationships  . Social connections:    Talks on  phone: Not on file    Gets together: Not on file    Attends religious service: Not on file    Active member of club or organization: Not on file    Attends meetings of clubs or organizations: Not on file    Relationship status: Not on file  Other Topics Concern  . Not on file  Social History Narrative  . Not on file     Family History: The patient's family history includes Heart attack in his brother; Heart failure in his father. ROS:   Please see the history of present illness.    All other systems reviewed and are negative.  EKGs/Labs/Other Studies Reviewed:    The following studies were reviewed today:  EKG:  EKG ordered today.  The ekg ordered today demonstrates [place] 1 APC  Recent Labs: No results found for requested labs within last 8760 hours.  Recent Lipid Panel No results found for: CHOL, TRIG, HDL, CHOLHDL, VLDL, LDLCALC, LDLDIRECT  Physical  Exam:    VS:  BP 114/68 (BP Location: Right Arm, Patient Position: Sitting, Cuff Size: Normal)   Pulse (!) 56   Ht 6\' 1"  (1.854 m)   Wt 171 lb (77.6 kg)   SpO2 97%   BMI 22.56 kg/m     Wt Readings from Last 3 Encounters:  08/22/17 171 lb (77.6 kg)  11/16/16 174 lb 1.3 oz (79 kg)  10/05/16 176 lb 12.8 oz (80.2 kg)     GEN: Well nourished, well developed in no acute distress HEENT: Normal NECK: No JVD; No carotid bruits LYMPHATICS: No lymphadenopathy CARDIAC: RRR, no murmurs, rubs, gallops RESPIRATORY:  Clear to auscultation without rales, wheezing or rhonchi  ABDOMEN: Soft, non-tender, non-distended MUSCULOSKELETAL:  No edema; No deformity  SKIN: Warm and dry NEUROLOGIC:  Alert and oriented x 3 PSYCHIATRIC:  Normal affect    Signed, Shirlee More, MD  08/22/2017 1:31 PM    [facility]

## 2017-08-22 ENCOUNTER — Ambulatory Visit (INDEPENDENT_AMBULATORY_CARE_PROVIDER_SITE_OTHER): Payer: Medicare HMO | Admitting: Cardiology

## 2017-08-22 ENCOUNTER — Encounter: Payer: Self-pay | Admitting: Cardiology

## 2017-08-22 VITALS — BP 114/68 | HR 56 | Ht 73.0 in | Wt 171.0 lb

## 2017-08-22 DIAGNOSIS — I480 Paroxysmal atrial fibrillation: Secondary | ICD-10-CM | POA: Diagnosis not present

## 2017-08-22 DIAGNOSIS — R071 Chest pain on breathing: Secondary | ICD-10-CM

## 2017-08-22 DIAGNOSIS — Z79899 Other long term (current) drug therapy: Secondary | ICD-10-CM

## 2017-08-22 DIAGNOSIS — R0789 Other chest pain: Secondary | ICD-10-CM

## 2017-08-22 MED ORDER — FLECAINIDE ACETATE 50 MG PO TABS: 50.0000 mg | ORAL_TABLET | Freq: Two times a day (BID) | ORAL | 0 refills | Status: DC

## 2017-08-22 MED ORDER — APIXABAN 5 MG PO TABS: 5.0000 mg | ORAL_TABLET | Freq: Two times a day (BID) | ORAL | 3 refills | Status: DC

## 2017-08-22 NOTE — Patient Instructions (Signed)
Medication Instructions:  Your physician recommends that you continue on your current medications as directed. Please refer to the Current Medication list given to you today.   Labwork: Your physician recommends that you have the following labs drawn: CMP  Testing/Procedures: You had an EKG today.  Follow-Up: Your physician wants you to follow-up in: 6 months.  You will receive a reminder letter in the mail two months in advance. If you don't receive a letter, please call our office to schedule the follow-up appointment.   Any Other Special Instructions Will Be Listed Below (If Applicable).     If you need a refill on your cardiac medications before your next appointment, please call your pharmacy.

## 2017-08-23 LAB — COMPREHENSIVE METABOLIC PANEL
ALBUMIN: 4.3 g/dL (ref 3.5–4.7)
ALT: 18 IU/L (ref 0–44)
AST: 23 IU/L (ref 0–40)
Albumin/Globulin Ratio: 2 (ref 1.2–2.2)
Alkaline Phosphatase: 91 IU/L (ref 39–117)
BUN / CREAT RATIO: 15 (ref 10–24)
BUN: 14 mg/dL (ref 8–27)
Bilirubin Total: <0.2 mg/dL (ref 0.0–1.2)
CO2: 25 mmol/L (ref 20–29)
Calcium: 9.7 mg/dL (ref 8.6–10.2)
Chloride: 104 mmol/L (ref 96–106)
Creatinine, Ser: 0.95 mg/dL (ref 0.76–1.27)
GFR calc Af Amer: 85 mL/min/{1.73_m2} (ref >59)
GFR calc non Af Amer: 74 mL/min/{1.73_m2} (ref >59)
GLOBULIN, TOTAL: 2.2 g/dL (ref 1.5–4.5)
GLUCOSE: 86 mg/dL (ref 65–99)
Potassium: 4.1 mmol/L (ref 3.5–5.2)
SODIUM: 140 mmol/L (ref 134–144)
TOTAL PROTEIN: 6.5 g/dL (ref 6.0–8.5)

## 2017-09-02 ENCOUNTER — Telehealth: Payer: Self-pay | Admitting: Cardiology

## 2017-09-02 NOTE — Telephone Encounter (Signed)
Informed wife of the lab results and clarified where privileges are.

## 2017-09-02 NOTE — Telephone Encounter (Signed)
Patient is asking if Dr. Has privileges at [facility] in case they go there.

## 2017-09-02 NOTE — Telephone Encounter (Signed)
Please call patient with lab results

## 2017-09-30 DIAGNOSIS — IMO0001 Reserved for inherently not codable concepts without codable children: Secondary | ICD-10-CM

## 2017-09-30 DIAGNOSIS — R5381 Other malaise: Secondary | ICD-10-CM

## 2017-09-30 DIAGNOSIS — E559 Vitamin D deficiency, unspecified: Secondary | ICD-10-CM

## 2017-09-30 DIAGNOSIS — R911 Solitary pulmonary nodule: Secondary | ICD-10-CM

## 2017-09-30 DIAGNOSIS — D3500 Benign neoplasm of unspecified adrenal gland: Secondary | ICD-10-CM | POA: Insufficient documentation

## 2017-09-30 DIAGNOSIS — R5383 Other fatigue: Secondary | ICD-10-CM

## 2017-09-30 DIAGNOSIS — Z0000 Encounter for general adult medical examination without abnormal findings: Secondary | ICD-10-CM

## 2017-09-30 HISTORY — DX: Other malaise: R53.81

## 2017-09-30 HISTORY — DX: Benign neoplasm of unspecified adrenal gland: D35.00

## 2017-09-30 HISTORY — DX: Other fatigue: R53.83

## 2017-09-30 HISTORY — DX: Reserved for inherently not codable concepts without codable children: IMO0001

## 2017-09-30 HISTORY — DX: Encounter for general adult medical examination without abnormal findings: Z00.00

## 2017-09-30 HISTORY — DX: Vitamin D deficiency, unspecified: E55.9

## 2017-09-30 HISTORY — DX: Solitary pulmonary nodule: R91.1

## 2017-10-04 ENCOUNTER — Telehealth: Payer: Self-pay | Admitting: Cardiology

## 2017-10-04 NOTE — Telephone Encounter (Signed)
Patient has swelling  In left foot and ankle and o other issues and he states that he also has a headache. I suggestd that he call his PCP but she insist on talking to Nurse.Marland Kitchen

## 2017-10-04 NOTE — Telephone Encounter (Signed)
Per the wife the patient had swelling in his left leg yesterday and his leg "gave out on him." Informed the wife that we might possibly be able to have [facility] see him in [facility]. The wife states that the patient has appointments the rest of the week with doctor's and would not be able to come in. Per the wife the patient's symptoms are resolving; she will observe for any worsening. Instructed to call us or her PCP with any further questions or concerns.

## 2017-10-06 DIAGNOSIS — G8929 Other chronic pain: Secondary | ICD-10-CM

## 2017-10-06 DIAGNOSIS — M25562 Pain in left knee: Secondary | ICD-10-CM

## 2017-10-06 DIAGNOSIS — M7062 Trochanteric bursitis, left hip: Secondary | ICD-10-CM

## 2017-10-06 DIAGNOSIS — M25561 Pain in right knee: Secondary | ICD-10-CM | POA: Insufficient documentation

## 2017-10-06 HISTORY — DX: Other chronic pain: G89.29

## 2017-10-06 HISTORY — DX: Trochanteric bursitis, left hip: M70.62

## 2017-10-06 HISTORY — DX: Pain in left knee: M25.562

## 2017-12-01 ENCOUNTER — Telehealth: Payer: Self-pay | Admitting: Cardiology

## 2017-12-01 NOTE — Telephone Encounter (Signed)
Fell in the donut hole and wants samples of eliquis

## 2017-12-01 NOTE — Telephone Encounter (Signed)
Wife will come tomorrow for samples.

## 2017-12-02 NOTE — Telephone Encounter (Signed)
Samples have been given to the patient. 

## 2018-02-20 ENCOUNTER — Telehealth: Payer: Self-pay | Admitting: Cardiology

## 2018-02-20 DIAGNOSIS — I480 Paroxysmal atrial fibrillation: Secondary | ICD-10-CM

## 2018-02-20 NOTE — Telephone Encounter (Signed)
Wants samples of eliquis and flecinide

## 2018-02-21 MED ORDER — APIXABAN 5 MG PO TABS: 5.0000 mg | ORAL_TABLET | Freq: Two times a day (BID) | ORAL | 0 refills | Status: DC

## 2018-02-21 MED ORDER — FLECAINIDE ACETATE 50 MG PO TABS: 50.0000 mg | ORAL_TABLET | Freq: Two times a day (BID) | ORAL | 0 refills | Status: DC

## 2018-02-21 NOTE — Telephone Encounter (Signed)
Patient's wife, Jana Half, per DPR is requesting a refill on flecainide and eliquis as patient will not have enough of these medications to get him to his follow up appointment on 03/10/2018 at 4:20 with Dr. Bettina Gavia. Refills sent to [facility] as requested. No further questions.

## 2018-02-21 NOTE — Addendum Note (Signed)
Addended by: Austin Miles on: 02/21/2018 09:16 AM   Modules accepted: Orders

## 2018-02-21 NOTE — Telephone Encounter (Signed)
Left message for patient to return call. Eliquis 5 mg samples are only available in the [employer] office.

## 2018-03-10 ENCOUNTER — Ambulatory Visit (INDEPENDENT_AMBULATORY_CARE_PROVIDER_SITE_OTHER): Payer: Medicare HMO | Admitting: Cardiology

## 2018-03-10 ENCOUNTER — Encounter: Payer: Self-pay | Admitting: Cardiology

## 2018-03-10 VITALS — BP 94/60 | HR 59 | Ht 73.0 in | Wt 170.8 lb

## 2018-03-10 DIAGNOSIS — Z7901 Long term (current) use of anticoagulants: Secondary | ICD-10-CM | POA: Diagnosis not present

## 2018-03-10 DIAGNOSIS — I9589 Other hypotension: Secondary | ICD-10-CM

## 2018-03-10 DIAGNOSIS — Z79899 Other long term (current) drug therapy: Secondary | ICD-10-CM | POA: Diagnosis not present

## 2018-03-10 DIAGNOSIS — E861 Hypovolemia: Secondary | ICD-10-CM

## 2018-03-10 DIAGNOSIS — I959 Hypotension, unspecified: Secondary | ICD-10-CM

## 2018-03-10 DIAGNOSIS — I480 Paroxysmal atrial fibrillation: Secondary | ICD-10-CM | POA: Diagnosis not present

## 2018-03-10 HISTORY — DX: Hypotension, unspecified: I95.9

## 2018-03-10 MED ORDER — APIXABAN 5 MG PO TABS: 5.0000 mg | ORAL_TABLET | Freq: Two times a day (BID) | ORAL | 1 refills | Status: DC

## 2018-03-10 MED ORDER — FLECAINIDE ACETATE 50 MG PO TABS: 50.0000 mg | ORAL_TABLET | Freq: Two times a day (BID) | ORAL | 1 refills | Status: DC

## 2018-03-10 MED ORDER — NITROGLYCERIN 0.4 MG SL SUBL: 0.4000 mg | SUBLINGUAL_TABLET | SUBLINGUAL | 5 refills | Status: DC | PRN

## 2018-03-10 NOTE — Patient Instructions (Signed)

## 2018-03-10 NOTE — Progress Notes (Signed)
Cardiology Office Note:    Date:  03/10/2018   ID:  Wayne Mcguire, DOB [birth year], MRN [id number]  PCP:  [name], MD  Cardiologist:  Shirlee More, MD    Referring MD: [name], MD    ASSESSMENT:    1. PAF (paroxysmal atrial fibrillation) ([place])   2. Chronic anticoagulation   3. High risk medication use   4. Hypotension due to hypovolemia    PLAN:    In order of problems listed above:  1. Stable he is maintaining sinus rhythm on low-dose flecainide and will continue his anticoagulation. 2. Continue his current anticoagulant 3. Continue flecainide no evidence of toxicity 4. 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   Next appointment: 6 months   Medication Adjustments/Labs and Tests Ordered: Current medicines are reviewed at length with the patient today.  Concerns regarding medicines are outlined above.  No orders of the defined types were placed in this encounter.  No orders of the defined types were placed in this encounter.   Chief Complaint  Patient presents with  . Atrial Fibrillation    History of Present Illness:    Wayne Mcguire is a 85 y.o. male with a hx of PAF on flecanide last seen 08/22/17. Compliance with diet, lifestyle and medications: Yes Past Medical History:  Diagnosis Date  . Adult situational stress disorder   . Anxiety   . Bladder cancer ([place])   . Depression   . Esophagitis   . GERD (gastroesophageal reflux disease)   . Vertigo     Past Surgical History:  Procedure Laterality Date  . BLADDER SURGERY    . VEIN SURGERY      Current Medications: Current Meds  Medication Sig  . acetaminophen (TYLENOL 8 HOUR) 650 MG CR tablet Take 650 mg by mouth 2 (two) times daily.  Marland Kitchen apixaban (ELIQUIS) 5 MG TABS tablet Take 1 tablet (5 mg total) by mouth  2 (two) times daily.  Marland Kitchen CRANBERRY PO Take 1 tablet by mouth daily.  Marland Kitchen Dextromethorphan-guaiFENesin (MUCINEX DM PO) Take 1 tablet by mouth 2 (two) times daily.  . flecainide (TAMBOCOR) 50 MG tablet Take 1 tablet (50 mg total) by mouth 2 (two) times daily.  . Multiple Vitamin (MULTIVITAMIN) tablet Take 1 tablet by mouth daily.  . Multiple Vitamins-Minerals (ICAPS AREDS 2 PO) Take 1 capsule by mouth daily.  . Multiple Vitamins-Minerals (OSTEO COMPLEX PO) Take 1 tablet by mouth daily.  . nitroGLYCERIN (NITROSTAT) 0.4 MG SL tablet Place 1 tablet (0.4 mg total) under the tongue every 5 (five) minutes as needed for chest pain.  . Probiotic Product (PROBIOTIC DAILY) CAPS Take 1 capsule by mouth daily.  . traZODone (DESYREL) 150 MG tablet Take 150 mg by mouth daily.  . vitamin B-12 (CYANOCOBALAMIN) 1000 MCG tablet Take 1,000 mcg by mouth daily.     Allergies:   Terbinafine and related   Social History   Socioeconomic History  . Marital status: Married    Spouse name: Not on file  . Number of children: Not on file  . Years of education: Not on file  . Highest education level: Not on file  Occupational History  . Not on file  Social Needs  . Financial resource strain: Not on file  . Food insecurity:    Worry: Not on file  Inability: Not on file  . Transportation needs:    Medical: Not on file    Non-medical: Not on file  Tobacco Use  . Smoking status: Current Every Day Smoker    Packs/day: 0.25    Types: Cigarettes  . Smokeless tobacco: Former Systems developer    Types: Chew  . Tobacco comment: Down to 5 cigs a day  Substance and Sexual Activity  . Alcohol use: No  . Drug use: No  . Sexual activity: Not on file  Lifestyle  . Physical activity:    Days per week: Not on file    Minutes per session: Not on file  . Stress: Not on file  Relationships  . Social connections:    Talks on phone: Not on file    Gets together: Not on file    Attends religious service: Not on file    Active  member of club or organization: Not on file    Attends meetings of clubs or organizations: Not on file    Relationship status: Not on file  Other Topics Concern  . Not on file  Social History Narrative  . Not on file     Family History: The patient's family history includes Heart attack in his brother; Heart failure in his father. ROS:   Please see the history of present illness.    All other systems reviewed and are negative.  EKGs/Labs/Other Studies Reviewed:    The following studies were reviewed today:  EKG:  EKG ordered today.  The ekg ordered today demonstrates shows sinus rhythm occasional APCs normal conduction intervals  Recent Labs: 08/22/2017: ALT 18; BUN 14; Creatinine, Ser 0.95; Potassium 4.1; Sodium 140  Recent Lipid Panel No results found for: CHOL, TRIG, HDL, CHOLHDL, VLDL, LDLCALC, LDLDIRECT  Physical Exam:    VS:  BP 94/60 (BP Location: Right Arm, Patient Position: Sitting, Cuff Size: Normal)   Pulse (!) 59   Ht 6\' 1"  (1.854 m)   Wt 170 lb 12.8 oz (77.5 kg)   SpO2 98%   BMI 22.53 kg/m     Wt Readings from Last 3 Encounters:  03/10/18 170 lb 12.8 oz (77.5 kg)  08/22/17 171 lb (77.6 kg)  11/16/16 174 lb 1.3 oz (79 kg)     GEN:  Well nourished, well developed in no acute distress HEENT: Normal NECK: No JVD; No carotid bruits LYMPHATICS: No lymphadenopathy CARDIAC: RRR, no murmurs, rubs, gallops RESPIRATORY:  Clear to auscultation without rales, wheezing or rhonchi  ABDOMEN: Soft, non-tender, non-distended MUSCULOSKELETAL:  No edema; No deformity  SKIN: Warm and dry NEUROLOGIC:  Alert and oriented x 3 PSYCHIATRIC:  Normal affect    Signed, Shirlee More, MD  03/10/2018 4:59 PM    [facility]

## 2018-04-03 DIAGNOSIS — R2681 Unsteadiness on feet: Secondary | ICD-10-CM

## 2018-04-03 DIAGNOSIS — R42 Dizziness and giddiness: Secondary | ICD-10-CM | POA: Insufficient documentation

## 2018-04-03 HISTORY — DX: Unsteadiness on feet: R26.81

## 2018-08-06 NOTE — Progress Notes (Signed)
Virtual Visit via Telephone Note   This visit type was conducted due to national recommendations for restrictions regarding the COVID-19 Pandemic (e.g. social distancing) in an effort to limit this patient's exposure and mitigate transmission in our community.  Due to his co-morbid illnesses, this patient is at least at moderate risk for complications without adequate follow up.  This format is felt to be most appropriate for this patient at this time.  The patient did not have access to video technology/had technical difficulties with video requiring transitioning to audio format only (telephone).  All issues noted in this document were discussed and addressed.  No physical exam could be performed with this format.  Please refer to the patient's chart for his  consent to telehealth for [facility].   Date:  08/07/2018   ID:  Wayne Mcguire, DOB [birth year], MRN [id number]  Patient Location: Home Provider Location: Office  PCP:  [name], MD  Cardiologist:  Shirlee More, MD  Electrophysiologist:  None   Evaluation Performed:  Follow-Up Visit  Chief Complaint:  PAF  History of Present Illness:    Wayne Mcguire is a 85 y.o. male with a hx of PAF on flecanide last seen  03/10/18.  The patient does not have symptoms concerning for COVID-19 infection (fever, chills, cough, or new shortness of breath).   Unfortunately has no access to a smart phone or Internet.  Several months ago he had palpitation but not in the last few months has no side effects from his medication and no bleeding complication of his anti-coagulant.  No edema chest pain or shortness of breath.  Recent labs performed with his PCP 04/03/2018 shows a normal CMP potassium 4.6 creatinine 0.93 hemoglobin 14.5 TSH 3.02 and free T4 and free T3 were normal. Past Medical History:  Diagnosis Date  . Adult situational stress disorder   . Anxiety   . Bladder cancer ([place])   . Depression   . Esophagitis   . GERD  (gastroesophageal reflux disease)   . Vertigo    Past Surgical History:  Procedure Laterality Date  . BLADDER SURGERY    . VEIN SURGERY       Current Meds  Medication Sig  . acetaminophen (TYLENOL 8 HOUR) 650 MG CR tablet Take 650 mg by mouth 2 (two) times daily.  Marland Kitchen apixaban (ELIQUIS) 5 MG TABS tablet Take 1 tablet (5 mg total) by mouth 2 (two) times daily.  Marland Kitchen CRANBERRY PO Take 1 tablet by mouth daily.  Marland Kitchen Dextromethorphan-guaiFENesin (MUCINEX DM PO) Take 1 tablet by mouth 2 (two) times daily.  . flecainide (TAMBOCOR) 50 MG tablet Take 1 tablet (50 mg total) by mouth 2 (two) times daily.  . Multiple Vitamin (MULTIVITAMIN) tablet Take 1 tablet by mouth daily.  . Multiple Vitamins-Minerals (ICAPS AREDS 2 PO) Take 1 capsule by mouth daily.  . Multiple Vitamins-Minerals (OSTEO COMPLEX PO) Take 1 tablet by mouth daily.  . nitroGLYCERIN (NITROSTAT) 0.4 MG SL tablet Place 1 tablet (0.4 mg total) under the tongue every 5 (five) minutes as needed for chest pain.  . Probiotic Product (PROBIOTIC DAILY) CAPS Take 1 capsule by mouth daily.  . traZODone (DESYREL) 150 MG tablet Take 150 mg by mouth daily.     Allergies:   Terbinafine and related   Social History   Tobacco Use  . Smoking status: Former Smoker    Packs/day: 1.00    Years: 70.00    Pack years: 70.00    Types: Cigarettes  Quit date: 04/2018    Years since quitting: 0.2  . Smokeless tobacco: Former Systems developer    Types: Chew  Substance Use Topics  . Alcohol use: No  . Drug use: No     Family Hx: The patient's family history includes Heart attack in his brother; Heart failure in his father.  ROS:   Please see the history of present illness.     All other systems reviewed and are negative.   Prior CV studies:   The following studies were reviewed today:    Labs/Other Tests and Data Reviewed:    EKG:  An ECG dated 03/10/18 was personally reviewed today and demonstrated:  [place] normal  Recent Labs: 08/22/2017: ALT 18;  BUN 14; Creatinine, Ser 0.95; Potassium 4.1; Sodium 140   Recent Lipid Panel No results found for: CHOL, TRIG, HDL, CHOLHDL, LDLCALC, LDLDIRECT  Wt Readings from Last 3 Encounters:  08/07/18 172 lb (78 kg)  03/10/18 170 lb 12.8 oz (77.5 kg)  08/22/17 171 lb (77.6 kg)     Objective:    Vital Signs:  BP 108/67 (BP Location: Left Arm, Patient Position: Sitting)   Pulse (!) 55   Wt 172 lb (78 kg)   BMI 22.69 kg/m    VITAL SIGNS:  reviewed mod affect thought and cognition are normal no respiratory distress  ASSESSMENT & PLAN:    1. Atrial fibrillation paroxysmal stable continue his anticoagulant low-dose antiarrhythmic drug and is not taking any rate suppressant medication.  I will see him sooner in 3 months to do an office EKG to assess his QRS interval for toxicity. 2.  Anticoagulation stable no bleeding complications continue Eliquis 3. High risk medication, flecainide stable on a very low dose will check EKG at a visit in 3 months to screen for toxicity          COVID-19 Education: The signs and symptoms of COVID-19 were discussed with the patient and how to seek care for testing (follow up with PCP or arrange E-visit).  The importance of social distancing was discussed today.  Time:   Today, I have spent 20 minutes with the patient with telehealth technology discussing the above problems.     Medication Adjustments/Labs and Tests Ordered: Current medicines are reviewed at length with the patient today.  Concerns regarding medicines are outlined above.   Tests Ordered: No orders of the defined types were placed in this encounter.   Medication Changes: No orders of the defined types were placed in this encounter.   Follow Up:  In Person in 3 month(s)  Signed, Shirlee More, MD  08/07/2018 4:08 PM    [facility]

## 2018-08-07 ENCOUNTER — Telehealth (INDEPENDENT_AMBULATORY_CARE_PROVIDER_SITE_OTHER): Payer: Medicare HMO | Admitting: Cardiology

## 2018-08-07 ENCOUNTER — Other Ambulatory Visit: Payer: Self-pay

## 2018-08-07 ENCOUNTER — Encounter: Payer: Self-pay | Admitting: Cardiology

## 2018-08-07 ENCOUNTER — Telehealth: Payer: Self-pay | Admitting: Cardiology

## 2018-08-07 VITALS — BP 108/67 | HR 55 | Wt 172.0 lb

## 2018-08-07 DIAGNOSIS — Z7901 Long term (current) use of anticoagulants: Secondary | ICD-10-CM

## 2018-08-07 DIAGNOSIS — Z79899 Other long term (current) drug therapy: Secondary | ICD-10-CM

## 2018-08-07 DIAGNOSIS — I480 Paroxysmal atrial fibrillation: Secondary | ICD-10-CM | POA: Diagnosis not present

## 2018-08-07 MED ORDER — FLECAINIDE ACETATE 50 MG PO TABS: 50.0000 mg | ORAL_TABLET | Freq: Two times a day (BID) | ORAL | 1 refills | Status: DC

## 2018-08-07 MED ORDER — APIXABAN 5 MG PO TABS: 5.0000 mg | ORAL_TABLET | Freq: Two times a day (BID) | ORAL | 1 refills | Status: DC

## 2018-08-07 NOTE — Patient Instructions (Addendum)
Medication Instructions:  Your physician recommends that you continue on your current medications as directed. Please refer to the Current Medication list given to you today.  If you need a refill on your cardiac medications before your next appointment, please call your pharmacy.   Lab work: NOne If you have labs (blood work) drawn today and your tests are completely normal, you will receive your results only by: Marland Kitchen MyChart Message (if you have MyChart) OR . A paper copy in the mail If you have any lab test that is abnormal or we need to change your treatment, we will call you to review the results.  Testing/Procedures: NOne  Follow-Up: At [facility], you and your health needs are our priority.  As part of our continuing mission to provide you with exceptional heart care, we have created designated Provider Care Teams.  These Care Teams include your primary Cardiologist (physician) and Advanced Practice Providers (APPs -  Physician Assistants and Nurse Practitioners) who all work together to provide you with the care you need, when you need it. You will need a follow up appointment in 3 months.   Any Other Special Instructions Will Be Listed Below (If Applicable).

## 2018-08-07 NOTE — Telephone Encounter (Signed)
Virtual Visit Pre-Appointment Phone Call  "(Name), I am calling you today to discuss your upcoming appointment. We are currently trying to limit exposure to the virus that causes COVID-19 by seeing patients at home rather than in the office."  1. "What is the BEST phone number to call the day of the visit?" - include this in appointment notes  2. Do you have or have access to (through a family member/friend) a smartphone with video capability that we can use for your visit?" a. If yes - list this number in appt notes as cell (if different from BEST phone #) and list the appointment type as a VIDEO visit in appointment notes b. If no - list the appointment type as a PHONE visit in appointment notes  3. Confirm consent - "In the setting of the current Covid19 crisis, you are scheduled for a (phone or video) visit with your provider on (date) at (time).  Just as we do with many in-office visits, in order for you to participate in this visit, we must obtain consent.  If you'd like, I can send this to your mychart (if signed up) or email for you to review.  Otherwise, I can obtain your verbal consent now.  All virtual visits are billed to your insurance company just like a normal visit would be.  By agreeing to a virtual visit, we'd like you to understand that the technology does not allow for your provider to perform an examination, and thus may limit your provider's ability to fully assess your condition. If your provider identifies any concerns that need to be evaluated in person, we will make arrangements to do so.  Finally, though the technology is pretty good, we cannot assure that it will always work on either your or our end, and in the setting of a video visit, we may have to convert it to a phone-only visit.  In either situation, we cannot ensure that we have a secure connection.  Are you willing to proceed?" STAFF: Did the patient verbally acknowledge consent to telehealth visit? Document  YES/NO here: Yes per spouse  4. Advise patient to be prepared - "Two hours prior to your appointment, go ahead and check your blood pressure, pulse, oxygen saturation, and your weight (if you have the equipment to check those) and write them all down. When your visit starts, your provider will ask you for this information. If you have an Apple Watch or Kardia device, please plan to have heart rate information ready on the day of your appointment. Please have a pen and paper handy nearby the day of the visit as well."  5. Give patient instructions for MyChart download to smartphone OR Doximity/Doxy.me as below if video visit (depending on what platform provider is using)  6. Inform patient they will receive a phone call 15 minutes prior to their appointment time (may be from unknown caller ID) so they should be prepared to answer    TELEPHONE CALL NOTE  Wayne Mcguire has been deemed a candidate for a follow-up tele-health visit to limit community exposure during the Covid-19 pandemic. I spoke with the patient via phone to ensure availability of phone/video source, confirm preferred email & phone number, and discuss instructions and expectations.  I reminded Wayne Mcguire to be prepared with any vital sign and/or heart rhythm information that could potentially be obtained via home monitoring, at the time of his visit. I reminded Wayne Mcguire to expect a phone call  prior to his visit.  Wayne Mcguire 08/07/2018 9:11 AM   INSTRUCTIONS FOR DOWNLOADING THE MYCHART APP TO SMARTPHONE  - The patient must first make sure to have activated MyChart and know their login information - If Apple, go to [employer] and type in MyChart in the search bar and download the app. If Android, ask patient to go to [employer] and type in [place] in the search bar and download the app. The app is free but as with any other app downloads, their phone may require them to verify saved payment information or  Apple/Android password.  - The patient will need to then log into the app with their MyChart username and password, and select [facility] as their healthcare provider to link the account. When it is time for your visit, go to the MyChart app, find appointments, and click Begin Video Visit. Be sure to Select Allow for your device to access the Microphone and Camera for your visit. You will then be connected, and your provider will be with you shortly.  **If they have any issues connecting, or need assistance please contact MyChart service desk [phone])**  **If using a computer, in order to ensure the best quality for their visit they will need to use either of the following Internet Browsers: [employer], or Google Chrome**  IF USING DOXIMITY or DOXY.ME - The patient will receive a link just prior to their visit by text.     FULL LENGTH CONSENT FOR TELE-HEALTH VISIT   I hereby voluntarily request, consent and authorize [facility] and its employed or contracted physicians, physician assistants, nurse practitioners or other licensed health care professionals (the Practitioner), to provide me with telemedicine health care services (the Services") as deemed necessary by the treating Practitioner. I acknowledge and consent to receive the Services by the Practitioner via telemedicine. I understand that the telemedicine visit will involve communicating with the Practitioner through live audiovisual communication technology and the disclosure of certain medical information by electronic transmission. I acknowledge that I have been given the opportunity to request an in-person assessment or other available alternative prior to the telemedicine visit and am voluntarily participating in the telemedicine visit.  I understand that I have the right to withhold or withdraw my consent to the use of telemedicine in the course of my care at any time, without affecting my right to future care  or treatment, and that the Practitioner or I may terminate the telemedicine visit at any time. I understand that I have the right to inspect all information obtained and/or recorded in the course of the telemedicine visit and may receive copies of available information for a reasonable fee.  I understand that some of the potential risks of receiving the Services via telemedicine include:   Delay or interruption in medical evaluation due to technological equipment failure or disruption;  Information transmitted may not be sufficient (e.g. poor resolution of images) to allow for appropriate medical decision making by the Practitioner; and/or   In rare instances, security protocols could fail, causing a breach of personal health information.  Furthermore, I acknowledge that it is my responsibility to provide information about my medical history, conditions and care that is complete and accurate to the best of my ability. I acknowledge that Practitioner's advice, recommendations, and/or decision may be based on factors not within their control, such as incomplete or inaccurate data provided by me or distortions of diagnostic images or specimens that may result from electronic  transmissions. I understand that the practice of medicine is not an exact science and that Practitioner makes no warranties or guarantees regarding treatment outcomes. I acknowledge that I will receive a copy of this consent concurrently upon execution via email to the email address I last provided but may also request a printed copy by calling the office of [facility].    I understand that my insurance will be billed for this visit.   I have read or had this consent read to me.  I understand the contents of this consent, which adequately explains the benefits and risks of the Services being provided via telemedicine.   I have been provided ample opportunity to ask questions regarding this consent and the Services and have had  my questions answered to my satisfaction.  I give my informed consent for the services to be provided through the use of telemedicine in my medical care  By participating in this telemedicine visit I agree to the above.

## 2018-08-15 ENCOUNTER — Telehealth: Payer: Self-pay | Admitting: *Deleted

## 2018-08-15 NOTE — Telephone Encounter (Signed)
Pt is having a lot of dizziness and weakness since he started the Flecainide 50 mg. Is getting progressively worse with these symptoms. Dr. Unk Lightning suggested symptoms may be due to this medication. Please advise.

## 2018-08-15 NOTE — Telephone Encounter (Signed)
I would be surprised if it is the cause, would set him up to do a 7-day ZIO monitor

## 2018-08-16 NOTE — Telephone Encounter (Signed)
Pt had EKG at Dr. Unk Lightning office this week if you want to look at that to help with your decision.

## 2018-08-16 NOTE — Telephone Encounter (Signed)
Spoke with pt's wife about having a 7 day monitor sent to pt. She said she would talk to husband about this and call me back this afternoon.

## 2018-08-16 NOTE — Telephone Encounter (Signed)
Let pt's wife know to d/c flecainide and scheduled appt for 7/2 with Dr. Bettina Gavia

## 2018-08-16 NOTE — Telephone Encounter (Signed)
Pt is unwilling to wear monitor. Pt has weakness, dizziness, muscle and joint pain and stomach upset/most of the side effects of Flecainide. Can pt try to cut tablet in half and see if symptoms get better. Please advise

## 2018-08-16 NOTE — Telephone Encounter (Signed)
Looking at your note I think the best thing is to tell him to discontinue flecainide and given an appointment to be seen next week by me in the office

## 2018-08-23 NOTE — Progress Notes (Signed)
Cardiology Office Note:    Date:  08/24/2018   ID:  Wayne Mcguire, DOB 1933/01/23, MRN [id number]  PCP:  [name], MD  Cardiologist:  Shirlee More, MD    Referring MD: [name], MD    ASSESSMENT:    1. PAF (paroxysmal atrial fibrillation) ([place])   2. Chronic anticoagulation    PLAN:    In order of problems listed above:  1. He has had no recent atrial fibrillation I think the best approach here would be pill in pocket with flecainide and if he fails this referral to EP he agrees to continue his anticoagulant 2. Continue his anticoagulant   Next appointment: 6 months   Medication Adjustments/Labs and Tests Ordered: Current medicines are reviewed at length with the patient today.  Concerns regarding medicines are outlined above.  Orders Placed This Encounter  Procedures  . EKG 12-Lead   Meds ordered this encounter  Medications  . flecainide (TAMBOCOR) 50 MG tablet    Sig: Take 1 tablet (50 mg total) by mouth 2 (two) times daily.    Dispense:  180 tablet    Refill:  3    Chief Complaint  Patient presents with  . Atrial Fibrillation    intolerant of flecanide    History of Present Illness:    Wayne Mcguire is a 85 y.o. male with a hx of a hx of PAF on flecanide and chronic anticoagulation last seen  08/07/2018.  His flecainide was discontinued 08/16/2018 because of complaints of weakness dizziness muscle joint pain and GI upset that the patient attributed to the medication.  We had asked him to wear an ambulatory heart rhythm monitor he declined   Compliance with diet, lifestyle and medications: Yes  After stopping flecainide he is recovered and he is fairly sure that he had long-term GI toxicity.  We discussed strategies for recurrent atrial fibrillation he is not interested in having EP ablations I am concerned that he will be poorly tolerant of other antiarrhythmic drugs.  After discussion he agrees to try pill in the pocket approach he can  tell when he is in atrial fibrillation has a blood pressure cuff that gives an alarm and he will take a tablet of 50 mg and repeat in 12 hours continue his anticoagulant.  I offered to check labs on him today particularly his CBC his anticoagulant he declines he will let me know if things are not working out well and I will see him in 6 months Past Medical History:  Diagnosis Date  . Adult situational stress disorder   . Anxiety   . Bladder cancer ([place])   . Depression   . Esophagitis   . GERD (gastroesophageal reflux disease)   . Vertigo     Past Surgical History:  Procedure Laterality Date  . BLADDER SURGERY    . VEIN SURGERY      Current Medications: Current Meds  Medication Sig  . acetaminophen (TYLENOL 8 HOUR) 650 MG CR tablet Take 650 mg by mouth 2 (two) times daily.  Marland Kitchen apixaban (ELIQUIS) 5 MG TABS tablet Take 1 tablet (5 mg total) by mouth 2 (two) times daily.  Marland Kitchen CRANBERRY PO Take 1 tablet by mouth daily.  Marland Kitchen Dextromethorphan-guaiFENesin (MUCINEX DM PO) Take 1 tablet by mouth 2 (two) times daily.  . Multiple Vitamin (MULTIVITAMIN) tablet Take 1 tablet by mouth daily.  . Multiple Vitamins-Minerals (ICAPS AREDS 2 PO) Take 1 capsule by mouth daily.  . Multiple Vitamins-Minerals (OSTEO  COMPLEX PO) Take 1 tablet by mouth daily.  . nitroGLYCERIN (NITROSTAT) 0.4 MG SL tablet Place 1 tablet (0.4 mg total) under the tongue every 5 (five) minutes as needed for chest pain.  . Probiotic Product (PROBIOTIC DAILY) CAPS Take 1 capsule by mouth daily.     Allergies:   Terbinafine and related   Social History   Socioeconomic History  . Marital status: Married    Spouse name: Not on file  . Number of children: Not on file  . Years of education: Not on file  . Highest education level: Not on file  Occupational History  . Not on file  Social Needs  . Financial resource strain: Not on file  . Food insecurity    Worry: Not on file    Inability: Not on file  . Transportation needs     Medical: Not on file    Non-medical: Not on file  Tobacco Use  . Smoking status: Former Smoker    Packs/day: 1.00    Years: 70.00    Pack years: 70.00    Types: Cigarettes    Quit date: 04/2018    Years since quitting: 0.3  . Smokeless tobacco: Never Used  Substance and Sexual Activity  . Alcohol use: No  . Drug use: No  . Sexual activity: Not on file  Lifestyle  . Physical activity    Days per week: Not on file    Minutes per session: Not on file  . Stress: Not on file  Relationships  . Social Herbalist on phone: Not on file    Gets together: Not on file    Attends religious service: Not on file    Active member of club or organization: Not on file    Attends meetings of clubs or organizations: Not on file    Relationship status: Not on file  Other Topics Concern  . Not on file  Social History Narrative  . Not on file     Family History: The patient's family history includes Heart attack in his brother; Heart failure in his father. ROS:   Please see the history of present illness.    All other systems reviewed and are negative.  EKGs/Labs/Other Studies Reviewed:    The following studies were reviewed today:  EKG:  EKG ordered today and personally reviewed.  The ekg ordered today demonstrates sinus bradycardia otherwise normal  Recent Labs: No results found for requested labs within last 8760 hours.  Recent Lipid Panel No results found for: CHOL, TRIG, HDL, CHOLHDL, VLDL, LDLCALC, LDLDIRECT  Physical Exam:    VS:  BP 102/64 (BP Location: Right Arm, Patient Position: Sitting, Cuff Size: Normal)   Pulse (!) 53   Temp (!) 97.5 F (36.4 C)   Ht 6\' 1"  (1.854 m)   Wt 176 lb 6.4 oz (80 kg)   SpO2 97%   BMI 23.27 kg/m     Wt Readings from Last 3 Encounters:  08/24/18 176 lb 6.4 oz (80 kg)  08/07/18 172 lb (78 kg)  03/10/18 170 lb 12.8 oz (77.5 kg)     GEN:  Well nourished, well developed in no acute distress HEENT: Normal NECK: No JVD; No  carotid bruits LYMPHATICS: No lymphadenopathy CARDIAC: RRR, no murmurs, rubs, gallops RESPIRATORY:  Clear to auscultation without rales, wheezing or rhonchi  ABDOMEN: Soft, non-tender, non-distended MUSCULOSKELETAL:  No edema; No deformity  SKIN: Warm and dry NEUROLOGIC:  Alert and oriented x 3 PSYCHIATRIC:  Normal  affect    Signed, Shirlee More, MD  08/24/2018 4:15 PM    [facility]

## 2018-08-24 ENCOUNTER — Ambulatory Visit (INDEPENDENT_AMBULATORY_CARE_PROVIDER_SITE_OTHER): Payer: Medicare HMO | Admitting: Cardiology

## 2018-08-24 ENCOUNTER — Other Ambulatory Visit: Payer: Self-pay

## 2018-08-24 ENCOUNTER — Encounter: Payer: Self-pay | Admitting: Cardiology

## 2018-08-24 VITALS — BP 102/64 | HR 53 | Temp 97.5°F | Ht 73.0 in | Wt 176.4 lb

## 2018-08-24 DIAGNOSIS — Z7901 Long term (current) use of anticoagulants: Secondary | ICD-10-CM | POA: Diagnosis not present

## 2018-08-24 DIAGNOSIS — I480 Paroxysmal atrial fibrillation: Secondary | ICD-10-CM | POA: Diagnosis not present

## 2018-08-24 MED ORDER — FLECAINIDE ACETATE 50 MG PO TABS: 50.0000 mg | ORAL_TABLET | Freq: Two times a day (BID) | ORAL | 3 refills | Status: DC

## 2018-08-24 NOTE — Patient Instructions (Addendum)
Medication Instructions:  Your physician has recommended you make the following change in your medication:   START: Flecanide 50 mg : Take 1 tab twice daily as needed for recurrent atrial fib  If you need a refill on your cardiac medications before your next appointment, please call your pharmacy.   Lab work: None If you have labs (blood work) drawn today and your tests are completely normal, you will receive your results only by: Marland Kitchen MyChart Message (if you have MyChart) OR . A paper copy in the mail If you have any lab test that is abnormal or we need to change your treatment, we will call you to review the results.  Testing/Procedures: None  Follow-Up: At [facility], you and your health needs are our priority.  As part of our continuing mission to provide you with exceptional heart care, we have created designated Provider Care Teams.  These Care Teams include your primary Cardiologist (physician) and Advanced Practice Providers (APPs -  Physician Assistants and Nurse Practitioners) who all work together to provide you with the care you need, when you need it. You will need a follow up appointment in 6 months.   Any Other Special Instructions Will Be Listed Below (If Applicable).   Flecainide tablets What is this medicine? FLECAINIDE (FLEK a nide) is an antiarrhythmic drug. This medicine is used to prevent irregular heart rhythm. It can also slow down fast heartbeats called tachycardia. This medicine may be used for other purposes; ask your health care provider or pharmacist if you have questions. COMMON BRAND NAME(S): Tambocor What should I tell my health care provider before I take this medicine? They need to know if you have any of these conditions:  abnormal levels of potassium in the blood  heart disease including heart rhythm and heart rate problems  kidney or liver disease  recent heart attack  an unusual or allergic reaction to flecainide, local anesthetics, other  medicines, foods, dyes, or preservatives  pregnant or trying to get pregnant  breast-feeding How should I use this medicine? Take this medicine by mouth with a glass of water. Follow the directions on the prescription label. You can take this medicine with or without food. Take your doses at regular intervals. Do not take your medicine more often than directed. Do not stop taking this medicine suddenly. This may cause serious, heart-related side effects. If your doctor wants you to stop the medicine, the dose may be slowly lowered over time to avoid any side effects. Talk to your pediatrician regarding the use of this medicine in children. While this drug may be prescribed for children as young as 1 year of age for selected conditions, precautions do apply. Overdosage: If you think you have taken too much of this medicine contact a poison control center or emergency room at once. NOTE: This medicine is only for you. Do not share this medicine with others. What if I miss a dose? If you miss a dose, take it as soon as you can. If it is almost time for your next dose, take only that dose. Do not take double or extra doses. What may interact with this medicine? Do not take this medicine with any of the following medications:  amoxapine  arsenic trioxide  certain antibiotics like clarithromycin, erythromycin, gatifloxacin, gemifloxacin, levofloxacin, moxifloxacin, sparfloxacin, or troleandomycin  certain antidepressants called tricyclic antidepressants like amitriptyline, imipramine, or nortriptyline  certain medicines to control heart rhythm like disopyramide, encainide, moricizine, procainamide, propafenone, and quinidine  cisapride  delavirdine  droperidol  haloperidol  [place]  imatinib  levomethadyl  maprotiline  medicines for malaria like chloroquine and halofantrine  pentamidine  phenothiazines like chlorpromazine, mesoridazine, prochlorperazine,  thioridazine  pimozide  quinine  ranolazine  ritonavir  sertindole This medicine may also interact with the following medications:  cimetidine  dofetilide  medicines for angina or high blood pressure  medicines to control heart rhythm like amiodarone and digoxin  ziprasidone This list may not describe all possible interactions. Give your health care provider a list of all the medicines, herbs, non-prescription drugs, or dietary supplements you use. Also tell them if you smoke, drink alcohol, or use illegal drugs. Some items may interact with your medicine. What should I watch for while using this medicine? Visit your doctor or health care professional for regular checks on your progress. Because your condition and the use of this medicine carries some risk, it is a good idea to carry an identification card, necklace or bracelet with details of your condition, medications and doctor or health care professional. Check your blood pressure and pulse rate regularly. Ask your health care professional what your blood pressure and pulse rate should be, and when you should contact him or her. Your doctor or health care professional also may schedule regular blood tests and electrocardiograms to check your progress. You may get drowsy or dizzy. Do not drive, use machinery, or do anything that needs mental alertness until you know how this medicine affects you. Do not stand or sit up quickly, especially if you are an older patient. This reduces the risk of dizzy or fainting spells. Alcohol can make you more dizzy, increase flushing and rapid heartbeats. Avoid alcoholic drinks. What side effects may I notice from receiving this medicine? Side effects that you should report to your doctor or health care professional as soon as possible:  chest pain, continued irregular heartbeats  difficulty breathing  swelling of the legs or feet  trembling, shaking  unusually weak or tired Side effects  that usually do not require medical attention (report to your doctor or health care professional if they continue or are bothersome):  blurred vision  constipation  headache  nausea, vomiting  stomach pain This list may not describe all possible side effects. Call your doctor for medical advice about side effects. You may report side effects to FDA at 1-800-FDA-1088. Where should I keep my medicine? Keep out of the reach of children. Store at room temperature between 15 and 30 degrees C (59 and 86 degrees F). Protect from light. Keep container tightly closed. Throw away any unused medicine after the expiration date. NOTE: This sheet is a summary. It may not cover all possible information. If you have questions about this medicine, talk to your doctor, pharmacist, or health care provider.  2020 Elsevier/Gold Standard (2018-01-30 11:41:38)

## 2018-09-14 DIAGNOSIS — G8929 Other chronic pain: Secondary | ICD-10-CM

## 2018-09-14 DIAGNOSIS — Z96619 Presence of unspecified artificial shoulder joint: Secondary | ICD-10-CM | POA: Insufficient documentation

## 2018-09-14 DIAGNOSIS — M25511 Pain in right shoulder: Secondary | ICD-10-CM

## 2018-09-14 DIAGNOSIS — M25519 Pain in unspecified shoulder: Secondary | ICD-10-CM | POA: Insufficient documentation

## 2018-09-14 DIAGNOSIS — M79643 Pain in unspecified hand: Secondary | ICD-10-CM | POA: Insufficient documentation

## 2018-09-14 HISTORY — DX: Other chronic pain: G89.29

## 2018-09-14 HISTORY — DX: Pain in right shoulder: M25.511

## 2018-10-02 DIAGNOSIS — K5900 Constipation, unspecified: Secondary | ICD-10-CM

## 2018-10-02 DIAGNOSIS — N4889 Other specified disorders of penis: Secondary | ICD-10-CM

## 2018-10-02 DIAGNOSIS — R197 Diarrhea, unspecified: Secondary | ICD-10-CM | POA: Insufficient documentation

## 2018-10-02 HISTORY — DX: Other specified disorders of penis: N48.89

## 2018-10-02 HISTORY — DX: Constipation, unspecified: K59.00

## 2018-10-02 HISTORY — DX: Diarrhea, unspecified: R19.7

## 2018-11-06 DIAGNOSIS — R609 Edema, unspecified: Secondary | ICD-10-CM

## 2018-11-06 DIAGNOSIS — R600 Localized edema: Secondary | ICD-10-CM

## 2018-11-06 DIAGNOSIS — M545 Low back pain, unspecified: Secondary | ICD-10-CM

## 2018-11-06 DIAGNOSIS — R058 Other specified cough: Secondary | ICD-10-CM

## 2018-11-06 DIAGNOSIS — N452 Orchitis: Secondary | ICD-10-CM

## 2018-11-06 DIAGNOSIS — R369 Urethral discharge, unspecified: Secondary | ICD-10-CM

## 2018-11-06 HISTORY — DX: Urethral discharge, unspecified: R36.9

## 2018-11-06 HISTORY — DX: Low back pain, unspecified: M54.50

## 2018-11-06 HISTORY — DX: Orchitis: N45.2

## 2018-11-06 HISTORY — DX: Other specified cough: R05.8

## 2018-11-06 HISTORY — DX: Edema, unspecified: R60.9

## 2018-11-06 HISTORY — DX: Localized edema: R60.0

## 2018-11-29 ENCOUNTER — Ambulatory Visit: Payer: Medicare HMO | Admitting: Cardiology

## 2018-12-26 ENCOUNTER — Other Ambulatory Visit: Payer: Self-pay | Admitting: Cardiology

## 2018-12-26 ENCOUNTER — Telehealth: Payer: Self-pay | Admitting: Cardiology

## 2018-12-26 DIAGNOSIS — I480 Paroxysmal atrial fibrillation: Secondary | ICD-10-CM

## 2018-12-26 NOTE — Telephone Encounter (Signed)
Call eliquis to [facility]

## 2018-12-26 NOTE — Telephone Encounter (Signed)
Refill sent to [facility] as requested

## 2019-01-23 ENCOUNTER — Ambulatory Visit (INDEPENDENT_AMBULATORY_CARE_PROVIDER_SITE_OTHER): Payer: Medicare HMO | Admitting: Cardiology

## 2019-01-23 ENCOUNTER — Other Ambulatory Visit: Payer: Self-pay

## 2019-01-23 VITALS — BP 108/64 | HR 57 | Ht 73.0 in | Wt 178.0 lb

## 2019-01-23 DIAGNOSIS — Z7901 Long term (current) use of anticoagulants: Secondary | ICD-10-CM

## 2019-01-23 DIAGNOSIS — R079 Chest pain, unspecified: Secondary | ICD-10-CM | POA: Diagnosis not present

## 2019-01-23 DIAGNOSIS — I480 Paroxysmal atrial fibrillation: Secondary | ICD-10-CM | POA: Diagnosis not present

## 2019-01-23 DIAGNOSIS — R0789 Other chest pain: Secondary | ICD-10-CM | POA: Diagnosis not present

## 2019-01-23 NOTE — Patient Instructions (Addendum)
Medication Instructions:  Your physician recommends that you continue on your current medications as directed. Please refer to the Current Medication list given to you today.  *If you need a refill on your cardiac medications before your next appointment, please call your pharmacy*  Lab Work: None.  If you have labs (blood work) drawn today and your tests are completely normal, you will receive your results only by: Marland Kitchen MyChart Message (if you have MyChart) OR . A paper copy in the mail If you have any lab test that is abnormal or we need to change your treatment, we will call you to review the results.  Testing/Procedures: Your physician has requested that you have a lexiscan myoview. For further information please visit [email or web address]. Please follow instruction sheet, as given.  Your physician has recommended that you wear a holter monitor. Holter monitors are medical devices that record the heart's electrical activity. Doctors most often use these monitors to diagnose arrhythmias. Arrhythmias are problems with the speed or rhythm of the heartbeat. The monitor is a small, portable device. You can wear one while you do your normal daily activities. This is usually used to diagnose what is causing palpitations/syncope (passing out). Wear for 7 days.     Follow-Up: At [facility], you and your health needs are our priority.  As part of our continuing mission to provide you with exceptional heart care, we have created designated Provider Care Teams.  These Care Teams include your primary Cardiologist (physician) and Advanced Practice Providers (APPs -  Physician Assistants and Nurse Practitioners) who all work together to provide you with the care you need, when you need it.  Your next appointment:   6 week(s)  The format for your next appointment:   In Person  Provider:   [name], MD  Other Instructions   Cardiac Nuclear Scan A cardiac nuclear scan is a test that  measures blood flow to the heart when a person is resting and when he or she is exercising. The test looks for problems such as:  Not enough blood reaching a portion of the heart.  The heart muscle not working normally. You may need this test if:  You have heart disease.  You have had abnormal lab results.  You have had heart surgery or a balloon procedure to open up blocked arteries (angioplasty).  You have chest pain.  You have shortness of breath. In this test, a radioactive dye (tracer) is injected into your bloodstream. After the tracer has traveled to your heart, an imaging device is used to measure how much of the tracer is absorbed by or distributed to various areas of your heart. This procedure is usually done at a hospital and takes 2-4 hours. Tell a health care provider about:  Any allergies you have.  All medicines you are taking, including vitamins, herbs, eye drops, creams, and over-the-counter medicines.  Any problems you or family members have had with anesthetic medicines.  Any blood disorders you have.  Any surgeries you have had.  Any medical conditions you have.  Whether you are pregnant or may be pregnant. What are the risks? Generally, this is a safe procedure. However, problems may occur, including:  Serious chest pain and heart attack. This is only a risk if the stress portion of the test is done.  Rapid heartbeat.  Sensation of warmth in your chest. This usually passes quickly.  Allergic reaction to the tracer. What happens before the procedure?  Ask your health care provider  about changing or stopping your regular medicines. This is especially important if you are taking diabetes medicines or blood thinners.  Follow instructions from your health care provider about eating or drinking restrictions.  Remove your jewelry on the day of the procedure. What happens during the procedure?  An IV will be inserted into one of your veins.  Your  health care provider will inject a small amount of radioactive tracer through the IV.  You will wait for 20-40 minutes while the tracer travels through your bloodstream.  Your heart activity will be monitored with an electrocardiogram (ECG).  You will lie down on an exam table.  Images of your heart will be taken for about 15-20 minutes.  You may also have a stress test. For this test, one of the following may be done: ? You will exercise on a treadmill or stationary bike. While you exercise, your heart's activity will be monitored with an ECG, and your blood pressure will be checked. ? You will be given medicines that will increase blood flow to parts of your heart. This is done if you are unable to exercise.  When blood flow to your heart has peaked, a tracer will again be injected through the IV.  After 20-40 minutes, you will get back on the exam table and have more images taken of your heart.  Depending on the type of tracer used, scans may need to be repeated 3-4 hours later.  Your IV line will be removed when the procedure is over. The procedure may vary among health care providers and hospitals. What happens after the procedure?  Unless your health care provider tells you otherwise, you may return to your normal schedule, including diet, activities, and medicines.  Unless your health care provider tells you otherwise, you may increase your fluid intake. This will help to flush the contrast dye from your body. Drink enough fluid to keep your urine pale yellow.  Ask your health care provider, or the department that is doing the test: ? When will my results be ready? ? How will I get my results? Summary  A cardiac nuclear scan measures the blood flow to the heart when a person is resting and when he or she is exercising.  Tell your health care provider if you are pregnant.  Before the procedure, ask your health care provider about changing or stopping your regular medicines.  This is especially important if you are taking diabetes medicines or blood thinners.  After the procedure, unless your health care provider tells you otherwise, increase your fluid intake. This will help flush the contrast dye from your body.  After the procedure, unless your health care provider tells you otherwise, you may return to your normal schedule, including diet, activities, and medicines. This information is not intended to replace advice given to you by your health care provider. Make sure you discuss any questions you have with your health care provider. Document Released: 03/05/2004 Document Revised: 07/25/2017 Document Reviewed: 07/25/2017 Elsevier Patient Education  2020 [employer].

## 2019-01-23 NOTE — Progress Notes (Signed)
Cardiology Office Note:    Date:  01/23/2019   ID:  [name], DOB Aug 04, 1932, MRN [id number]  PCP:  [name], MD  Cardiologist:  [name], MD    Referring MD: [name], MD   Chief Complaint  Patient presents with  . Chest Pain    Aching in the left side of the chest  . Shortness of Breath    Allergies  I have a chest pain History of Present Illness:    Wayne Mcguire is a 85 y.o. male with past medical history significant proximal atrial fibrillation, has been treated for years with flecainide however he still having some side effects and blood, and has been withdrawn.  Now he used flecainide only on as-needed basis for palpitations.  He wanted to be seen rather quickly because of episode of chest pain.  Pain is located on the left side of his chest that he pinpoint location where it is.  Current uneasy sensation he had difficult time describing it.  If not related to exercise last for few minutes interestingly he does not feel any palpitation of the time but he does take flecainide which helps with the pain.  At the same time he is able to walk climb stairs and have no major difficulty doing it.  He does get some short of breath and fatigue and tired when he does not.  No dizziness no passing out no syncope  Past Medical History:  Diagnosis Date  . Adult situational stress disorder   . Anxiety   . Bladder cancer ([place])   . Depression   . Esophagitis   . GERD (gastroesophageal reflux disease)   . Vertigo     Past Surgical History:  Procedure Laterality Date  . BLADDER SURGERY    . VEIN SURGERY      Current Medications: Current Meds  Medication Sig  . acetaminophen (TYLENOL 8 HOUR) 650 MG CR tablet Take 650 mg by mouth 2 (two) times daily.  . Cholecalciferol (VITAMIN D-3) 125 MCG (5000 UT) TABS Take 1 tablet by mouth daily.  Marland Kitchen CRANBERRY PO Take 1 tablet by mouth daily.  Marland Kitchen Dextromethorphan-guaiFENesin (MUCINEX DM PO) Take 1 tablet by mouth 2  (two) times daily.  Marland Kitchen ELIQUIS 5 MG TABS tablet TAKE 1 TABLET BY MOUTH TWICE DAILY  . flecainide (TAMBOCOR) 50 MG tablet Take 1 tablet (50 mg total) by mouth 2 (two) times daily. As needed for recurrent atrial fib  . fluticasone (FLONASE) 50 MCG/ACT nasal spray Place 2 sprays into both nostrils daily.  . Multiple Vitamin (MULTIVITAMIN) tablet Take 1 tablet by mouth daily.  . Multiple Vitamins-Minerals (ICAPS AREDS 2 PO) Take 1 capsule by mouth daily.  . nitroGLYCERIN (NITROSTAT) 0.4 MG SL tablet Place 1 tablet (0.4 mg total) under the tongue every 5 (five) minutes as needed for chest pain.  . Probiotic Product (PROBIOTIC DAILY) CAPS Take 1 capsule by mouth daily.  . traZODone (DESYREL) 150 MG tablet Take 150 mg by mouth daily.     Allergies:   Terbinafine and related   Social History   Socioeconomic History  . Marital status: Married    Spouse name: Not on file  . Number of children: Not on file  . Years of education: Not on file  . Highest education level: Not on file  Occupational History  . Not on file  Social Needs  . Financial resource strain: Not on file  . Food insecurity    Worry: Not  on file    Inability: Not on file  . Transportation needs    Medical: Not on file    Non-medical: Not on file  Tobacco Use  . Smoking status: Former Smoker    Packs/day: 1.00    Years: 70.00    Pack years: 70.00    Types: Cigarettes    Quit date: 04/2018    Years since quitting: 0.7  . Smokeless tobacco: Never Used  Substance and Sexual Activity  . Alcohol use: No  . Drug use: No  . Sexual activity: Not on file  Lifestyle  . Physical activity    Days per week: Not on file    Minutes per session: Not on file  . Stress: Not on file  Relationships  . Social Herbalist on phone: Not on file    Gets together: Not on file    Attends religious service: Not on file    Active member of club or organization: Not on file    Attends meetings of clubs or organizations: Not on  file    Relationship status: Not on file  Other Topics Concern  . Not on file  Social History Narrative  . Not on file     Family History: The patient's family history includes Heart attack in his brother; Heart failure in his father. ROS:   Please see the history of present illness.    All 14 point review of systems negative except as described per history of present illness  EKGs/Labs/Other Studies Reviewed:    EKG done today shows sinus bradycardia rate 59, normal P interval, normal QS complex duration morphology no ST segment changes  Recent Labs: No results found for requested labs within last 8760 hours.  Recent Lipid Panel No results found for: CHOL, TRIG, HDL, CHOLHDL, VLDL, LDLCALC, LDLDIRECT  Physical Exam:    VS:  BP 108/64   Pulse (!) 57   Ht 6\' 1"  (1.854 m)   Wt 178 lb (80.7 kg)   SpO2 97%   BMI 23.48 kg/m     Wt Readings from Last 3 Encounters:  01/23/19 178 lb (80.7 kg)  08/24/18 176 lb 6.4 oz (80 kg)  08/07/18 172 lb (78 kg)     GEN:  Well nourished, well developed in no acute distress HEENT: Normal NECK: No JVD; No carotid bruits LYMPHATICS: No lymphadenopathy CARDIAC: RRR, no murmurs, no rubs, no gallops RESPIRATORY:  Clear to auscultation without rales, wheezing or rhonchi  ABDOMEN: Soft, non-tender, non-distended MUSCULOSKELETAL:  No edema; No deformity  SKIN: Warm and dry LOWER EXTREMITIES: no swelling NEUROLOGIC:  Alert and oriented x 3 PSYCHIATRIC:  Normal affect   ASSESSMENT:    1. PAF (paroxysmal atrial fibrillation) (HCC)   2. Chest pain in adult   3. Chronic anticoagulation   4. Costochondral chest pain    PLAN:    In order of problems listed above:  1. Paroxysmal atrial fibrillation his symptoms are strange he described to have some chest sensation/pain at the same time he takes flecainide with good response.  He said this is exactly the same thing he had before when he had my atrial fibrillation.  So it is very possible that  what he feels is episode of atrial fibrillation.  He is anticoagulated because of his chads 2 Vascor being 3 which I will continue.  I will ask him to wear Zio patch to see if he does have paroxysmal atrial fibrillation.  I asked him to trigger  back to him when he got sensation that he described to me today.  As a part of evaluation I will schedule him for Lexiscan to assess his coronary arteries which is critically essential since he is taking flecainide on as-needed basis. 2. Chronic chest pain atypical difficulty described by patient.  We will do Lexiscan Chronic anticoagulation which will continue.    Medication Adjustments/Labs and Tests Ordered: Current medicines are reviewed at length with the patient today.  Concerns regarding medicines are outlined above.  No orders of the defined types were placed in this encounter.  Medication changes: No orders of the defined types were placed in this encounter.   Signed, Park Liter, MD, [facility] 01/23/2019 2:22 PM    [place]

## 2019-01-23 NOTE — Addendum Note (Signed)
Addended by: Ashok Norris on: 01/23/2019 02:53 PM   Modules accepted: Orders

## 2019-01-25 ENCOUNTER — Telehealth (HOSPITAL_COMMUNITY): Payer: Self-pay | Admitting: *Deleted

## 2019-01-25 NOTE — Telephone Encounter (Signed)
Attempted to call patient regarding upcoming appointment- no answer, unable to leave message.  Wayne Mcguire Jacqueline  

## 2019-01-30 ENCOUNTER — Telehealth: Payer: Self-pay | Admitting: *Deleted

## 2019-01-30 NOTE — Telephone Encounter (Signed)
Patient's wife per DPR was given detailed instructions per Myocardial Perfusion Study Information Sheet for the test on 02/01/2019 at 1130. Patient notified to arrive 15 minutes early and that it is imperative to arrive on time for appointment to keep from having the test rescheduled.  If you need to cancel or reschedule your appointment, please call the office within 24 hours of your appointment. . Patient verbalized understanding.Lavena Loretto, [place]  no mychart

## 2019-02-01 ENCOUNTER — Ambulatory Visit (INDEPENDENT_AMBULATORY_CARE_PROVIDER_SITE_OTHER): Payer: Medicare HMO

## 2019-02-01 ENCOUNTER — Other Ambulatory Visit: Payer: Self-pay

## 2019-02-01 VITALS — Ht 73.0 in | Wt 178.0 lb

## 2019-02-01 DIAGNOSIS — R079 Chest pain, unspecified: Secondary | ICD-10-CM | POA: Diagnosis not present

## 2019-02-01 DIAGNOSIS — I480 Paroxysmal atrial fibrillation: Secondary | ICD-10-CM | POA: Diagnosis not present

## 2019-02-01 LAB — MYOCARDIAL PERFUSION IMAGING
LV dias vol: 116 mL (ref 62–150)
LV sys vol: 44 mL
Peak HR: 68 {beats}/min
Rest HR: 51 {beats}/min
SDS: 1
SRS: 8
SSS: 9
TID: 0.94

## 2019-02-01 MED ORDER — REGADENOSON 0.4 MG/5ML IV SOLN
0.4000 mg | Freq: Once | INTRAVENOUS | Status: AC
  Administered 2019-02-01: 0.4 mg via INTRAVENOUS

## 2019-02-01 MED ORDER — TECHNETIUM TC 99M TETROFOSMIN IV KIT
30.0000 | PACK | Freq: Once | INTRAVENOUS | Status: AC | PRN
  Administered 2019-02-01: 30 via INTRAVENOUS

## 2019-02-01 MED ORDER — TECHNETIUM TC 99M TETROFOSMIN IV KIT
10.5000 | PACK | Freq: Once | INTRAVENOUS | Status: AC | PRN
  Administered 2019-02-01: 10.5 via INTRAVENOUS

## 2019-02-22 ENCOUNTER — Ambulatory Visit: Payer: Medicare HMO | Admitting: Cardiology

## 2019-03-06 ENCOUNTER — Ambulatory Visit (INDEPENDENT_AMBULATORY_CARE_PROVIDER_SITE_OTHER): Payer: Medicare HMO | Admitting: Cardiology

## 2019-03-06 ENCOUNTER — Encounter: Payer: Self-pay | Admitting: Cardiology

## 2019-03-06 ENCOUNTER — Other Ambulatory Visit: Payer: Self-pay

## 2019-03-06 VITALS — BP 106/70 | HR 71 | Ht 73.0 in | Wt 181.0 lb

## 2019-03-06 DIAGNOSIS — I472 Ventricular tachycardia, unspecified: Secondary | ICD-10-CM | POA: Insufficient documentation

## 2019-03-06 DIAGNOSIS — R079 Chest pain, unspecified: Secondary | ICD-10-CM

## 2019-03-06 DIAGNOSIS — I480 Paroxysmal atrial fibrillation: Secondary | ICD-10-CM

## 2019-03-06 DIAGNOSIS — Z7901 Long term (current) use of anticoagulants: Secondary | ICD-10-CM | POA: Diagnosis not present

## 2019-03-06 HISTORY — DX: Ventricular tachycardia: I47.2

## 2019-03-06 HISTORY — DX: Ventricular tachycardia, unspecified: I47.20

## 2019-03-06 MED ORDER — METOPROLOL TARTRATE 25 MG PO TABS: 25.0000 mg | ORAL_TABLET | Freq: Two times a day (BID) | ORAL | 1 refills | Status: DC

## 2019-03-06 NOTE — Progress Notes (Signed)
Cardiology Office Note:    Date:  03/06/2019   ID:  Wayne Mcguire, DOB 1932-04-13, MRN [id number]  PCP:  [name], MD  Cardiologist:  [name], MD    Referring MD: [name], MD   Chief Complaint  Patient presents with  . Follow-up  Still having some palpitations  History of Present Illness:    Wayne Mcguire is a 86 y.o. male with paroxysmal atrial fibrillation, anxiety, atypical chest pain comes today to my office for follow-up.  He stress test was negative for exercise-induced myocardial ischemia.  His monitor however showed narrow complex tachycardia as well as some wide-complex tachycardia rising suspicion for ventricular tachycardia.  He denies having any dizziness or passing out but does have some palpitations sometimes.  Chest pain still complain of having it mostly in his left shoulder and I told him he need to talk to his primary care physician since this is most likely related to some musculoskeletal pain.  Past Medical History:  Diagnosis Date  . Adult situational stress disorder   . Anxiety   . Bladder cancer ([place])   . Depression   . Esophagitis   . GERD (gastroesophageal reflux disease)   . Vertigo     Past Surgical History:  Procedure Laterality Date  . BLADDER SURGERY    . VEIN SURGERY      Current Medications: Current Meds  Medication Sig  . acetaminophen (TYLENOL 8 HOUR) 650 MG CR tablet Take 650 mg by mouth 2 (two) times daily.  . Cholecalciferol (VITAMIN D-3) 125 MCG (5000 UT) TABS Take 1 tablet by mouth daily.  Marland Kitchen CRANBERRY PO Take 1 tablet by mouth daily.  Marland Kitchen Dextromethorphan-guaiFENesin (MUCINEX DM PO) Take 1 tablet by mouth 2 (two) times daily.  Marland Kitchen ELIQUIS 5 MG TABS tablet TAKE 1 TABLET BY MOUTH TWICE DAILY  . fluticasone (FLONASE) 50 MCG/ACT nasal spray Place 2 sprays into both nostrils daily.  . Multiple Vitamin (MULTIVITAMIN) tablet Take 1 tablet by mouth daily.  . Multiple Vitamins-Minerals (ICAPS AREDS 2 PO) Take 1  capsule by mouth daily.  . nitroGLYCERIN (NITROSTAT) 0.4 MG SL tablet Place 1 tablet (0.4 mg total) under the tongue every 5 (five) minutes as needed for chest pain.  . Probiotic Product (PROBIOTIC DAILY) CAPS Take 1 capsule by mouth daily.  . traZODone (DESYREL) 150 MG tablet Take 150 mg by mouth daily.     Allergies:   Terbinafine and related   Social History   Socioeconomic History  . Marital status: Married    Spouse name: Not on file  . Number of children: Not on file  . Years of education: Not on file  . Highest education level: Not on file  Occupational History  . Not on file  Tobacco Use  . Smoking status: Former Smoker    Packs/day: 1.00    Years: 70.00    Pack years: 70.00    Types: Cigarettes    Quit date: 04/2018    Years since quitting: 0.8  . Smokeless tobacco: Never Used  Substance and Sexual Activity  . Alcohol use: No  . Drug use: No  . Sexual activity: Not on file  Other Topics Concern  . Not on file  Social History Narrative  . Not on file   Social Determinants of Health   Financial Resource Strain:   . Difficulty of Paying Living Expenses: Not on file  Food Insecurity:   . Worried About Charity fundraiser in the  Last Year: Not on file  . Ran Out of Food in the Last Year: Not on file  Transportation Needs:   . Lack of Transportation (Medical): Not on file  . Lack of Transportation (Non-Medical): Not on file  Physical Activity:   . Days of Exercise per Week: Not on file  . Minutes of Exercise per Session: Not on file  Stress:   . Feeling of Stress : Not on file  Social Connections:   . Frequency of Communication with Friends and Family: Not on file  . Frequency of Social Gatherings with Friends and Family: Not on file  . Attends Religious Services: Not on file  . Active Member of Clubs or Organizations: Not on file  . Attends Archivist Meetings: Not on file  . Marital Status: Not on file     Family History: The patient's  family history includes Heart attack in his brother; Heart failure in his father. ROS:   Please see the history of present illness.    All 14 point review of systems negative except as described per history of present illness  EKGs/Labs/Other Studies Reviewed:      Recent Labs: No results found for requested labs within last 8760 hours.  Recent Lipid Panel No results found for: CHOL, TRIG, HDL, CHOLHDL, VLDL, LDLCALC, LDLDIRECT  Physical Exam:    VS:  BP 106/70   Pulse 71   Ht 6\' 1"  (1.854 m)   Wt 181 lb (82.1 kg)   SpO2 95%   BMI 23.88 kg/m     Wt Readings from Last 3 Encounters:  03/06/19 181 lb (82.1 kg)  02/01/19 178 lb (80.7 kg)  01/23/19 178 lb (80.7 kg)     GEN:  Well nourished, well developed in no acute distress HEENT: Normal NECK: No JVD; No carotid bruits LYMPHATICS: No lymphadenopathy CARDIAC: RRR, no murmurs, no rubs, no gallops RESPIRATORY:  Clear to auscultation without rales, wheezing or rhonchi  ABDOMEN: Soft, non-tender, non-distended MUSCULOSKELETAL:  No edema; No deformity  SKIN: Warm and dry LOWER EXTREMITIES: no swelling NEUROLOGIC:  Alert and oriented x 3 PSYCHIATRIC:  Normal affect   ASSESSMENT:    1. PAF (paroxysmal atrial fibrillation) ([place])   2. Chronic anticoagulation   3. Chest pain in adult   4. Ventricular tachycardia ([place])    PLAN:    In order of problems listed above:  1. Paroxysmal atrial fibrillation anticoagulated which I will continue.  I will check his Chem-7 today based on that we will decide the dose is appropriate. 2. Chronic anticoagulation will continue for now 3. Chest pain stress test negative 4. V. tach I will ask you to have echocardiogram to stratify it this arrhythmia.  I will initiate 25 mg of Toprol twice daily.   Medication Adjustments/Labs and Tests Ordered: Current medicines are reviewed at length with the patient today.  Concerns regarding medicines are outlined above.  No orders of the defined types  were placed in this encounter.  Medication changes: No orders of the defined types were placed in this encounter.   Signed, Park Liter, MD, [facility] 03/06/2019 12:06 PM    [name]

## 2019-03-06 NOTE — Patient Instructions (Signed)
Medication Instructions:  Your physician has recommended you make the following change in your medication:  START: Metoprolol tartrate 25 mg twice daily   *If you need a refill on your cardiac medications before your next appointment, please call your pharmacy*  Lab Work: Your physician recommends that you return for lab work today: bmp   If you have labs (blood work) drawn today and your tests are completely normal, you will receive your results only by: Marland Kitchen MyChart Message (if you have MyChart) OR . A paper copy in the mail If you have any lab test that is abnormal or we need to change your treatment, we will call you to review the results.  Testing/Procedures: Your physician has requested that you have an echocardiogram. Echocardiography is a painless test that uses sound waves to create images of your heart. It provides your doctor with information about the size and shape of your heart and how well your heart's chambers and valves are working. This procedure takes approximately one hour. There are no restrictions for this procedure.    Follow-Up: At [facility], you and your health needs are our priority.  As part of our continuing mission to provide you with exceptional heart care, we have created designated Provider Care Teams.  These Care Teams include your primary Cardiologist (physician) and Advanced Practice Providers (APPs -  Physician Assistants and Nurse Practitioners) who all work together to provide you with the care you need, when you need it.  Your next appointment:   3 month(s)  The format for your next appointment:   In Person  Provider:   [name], MD  Other Instructions   Echocardiogram An echocardiogram is a procedure that uses painless sound waves (ultrasound) to produce an image of the heart. Images from an echocardiogram can provide important information about:  Signs of coronary artery disease (CAD).  Aneurysm detection. An aneurysm is a  weak or damaged part of an artery wall that bulges out from the normal force of blood pumping through the body.  Heart size and shape. Changes in the size or shape of the heart can be associated with certain conditions, including heart failure, aneurysm, and CAD.  Heart muscle function.  Heart valve function.  Signs of a past heart attack.  Fluid buildup around the heart.  Thickening of the heart muscle.  A tumor or infectious growth around the heart valves. Tell a health care provider about:  Any allergies you have.  All medicines you are taking, including vitamins, herbs, eye drops, creams, and over-the-counter medicines.  Any blood disorders you have.  Any surgeries you have had.  Any medical conditions you have.  Whether you are pregnant or may be pregnant. What are the risks? Generally, this is a safe procedure. However, problems may occur, including:  Allergic reaction to dye (contrast) that may be used during the procedure. What happens before the procedure? No specific preparation is needed. You may eat and drink normally. What happens during the procedure?   An IV tube may be inserted into one of your veins.  You may receive contrast through this tube. A contrast is an injection that improves the quality of the pictures from your heart.  A gel will be applied to your chest.  A wand-like tool (transducer) will be moved over your chest. The gel will help to transmit the sound waves from the transducer.  The sound waves will harmlessly bounce off of your heart to allow the heart images to be  captured in real-time motion. The images will be recorded on a computer. The procedure may vary among health care providers and hospitals. What happens after the procedure?  You may return to your normal, everyday life, including diet, activities, and medicines, unless your health care provider tells you not to do that. Summary  An echocardiogram is a procedure that uses  painless sound waves (ultrasound) to produce an image of the heart.  Images from an echocardiogram can provide important information about the size and shape of your heart, heart muscle function, heart valve function, and fluid buildup around your heart.  You do not need to do anything to prepare before this procedure. You may eat and drink normally.  After the echocardiogram is completed, you may return to your normal, everyday life, unless your health care provider tells you not to do that. This information is not intended to replace advice given to you by your health care provider. Make sure you discuss any questions you have with your health care provider. Document Revised: 06/01/2018 Document Reviewed: 03/13/2016 Elsevier Patient Education  [place]. '  Metoprolol Tablets What is this medicine? METOPROLOL (me TOE proe lole) is a beta blocker. It decreases the amount of work your heart has to do and helps your heart beat regularly. It is used to treat high blood pressure and/or prevent chest pain (also called angina). It is also used after a heart attack to prevent a second one. This medicine may be used for other purposes; ask your health care provider or pharmacist if you have questions. COMMON BRAND NAME(S): Lopressor What should I tell my health care provider before I take this medicine? They need to know if you have any of these conditions:  diabetes  heart or vessel disease like slow heart rate, worsening heart failure, heart block, sick sinus syndrome or Raynaud's disease  kidney disease  liver disease  lung or breathing disease, like asthma or emphysema  pheochromocytoma  thyroid disease  an unusual or allergic reaction to metoprolol, other beta-blockers, medicines, foods, dyes, or preservatives  pregnant or trying to get pregnant  breast-feeding How should I use this medicine? Take this drug by mouth with water. Take it as directed on the prescription  label at the same time every day. You can take it with or without food. You should always take it the same way. Keep taking it unless your health care provider tells you to stop. Talk to your health care provider about the use of this drug in children. Special care may be needed. Overdosage: If you think you have taken too much of this medicine contact a poison control center or emergency room at once. NOTE: This medicine is only for you. Do not share this medicine with others. What if I miss a dose? If you miss a dose, take it as soon as you can. If it is almost time for your next dose, take only that dose. Do not take double or extra doses. What may interact with this medicine? This medicine may interact with the following medications:  certain medicines for blood pressure, heart disease, irregular heart beat  certain medicines for depression like monoamine oxidase (MAO) inhibitors, fluoxetine, or paroxetine  clonidine  dobutamine  epinephrine  isoproterenol  reserpine This list may not describe all possible interactions. Give your health care provider a list of all the medicines, herbs, non-prescription drugs, or dietary supplements you use. Also tell them if you smoke, drink alcohol, or use illegal drugs. Some  items may interact with your medicine. What should I watch for while using this medicine? Visit your doctor or health care professional for regular check ups. Contact your doctor right away if your symptoms worsen. Check your blood pressure and pulse rate regularly. Ask your health care professional what your blood pressure and pulse rate should be, and when you should contact them. You may get drowsy or dizzy. Do not drive, use machinery, or do anything that needs mental alertness until you know how this medicine affects you. Do not sit or stand up quickly, especially if you are an older patient. This reduces the risk of dizzy or fainting spells. Contact your doctor if these  symptoms continue. Alcohol may interfere with the effect of this medicine. Avoid alcoholic drinks. This medicine may increase blood sugar. Ask your healthcare provider if changes in diet or medicines are needed if you have diabetes. What side effects may I notice from receiving this medicine? Side effects that you should report to your doctor or health care professional as soon as possible:  allergic reactions like skin rash, itching or hives  cold or numb hands or feet  depression  difficulty breathing  faint  fever with sore throat  irregular heartbeat, chest pain  rapid weight gain   signs and symptoms of high blood sugar such as being more thirsty or hungry or having to urinate more than normal. You may also feel very tired or have blurry vision.  swollen legs or ankles Side effects that usually do not require medical attention (report to your doctor or health care professional if they continue or are bothersome):  anxiety or nervousness  change in sex drive or performance  dry skin  headache  nightmares or trouble sleeping  short term memory loss  stomach upset or diarrhea This list may not describe all possible side effects. Call your doctor for medical advice about side effects. You may report side effects to FDA at 1-800-FDA-1088. Where should I keep my medicine? Keep out of the reach of children and pets. Store at room temperature between 15 and 30 degrees C (59 and 86 degrees F). Protect from moisture. Keep the container tightly closed. Throw away any unused drug after the expiration date. NOTE: This sheet is a summary. It may not cover all possible information. If you have questions about this medicine, talk to your doctor, pharmacist, or health care provider.  2020 Elsevier/Gold Standard (2018-09-21 17:21:17)

## 2019-03-07 ENCOUNTER — Ambulatory Visit (HOSPITAL_BASED_OUTPATIENT_CLINIC_OR_DEPARTMENT_OTHER)
Admission: RE | Admit: 2019-03-07 | Discharge: 2019-03-07 | Disposition: A | Discharge status: Home / Self Care | Payer: Medicare HMO | Source: Ambulatory Visit | Attending: Cardiology | Admitting: Cardiology

## 2019-03-07 ENCOUNTER — Telehealth: Payer: Self-pay | Admitting: Emergency Medicine

## 2019-03-07 DIAGNOSIS — I472 Ventricular tachycardia: Secondary | ICD-10-CM | POA: Diagnosis present

## 2019-03-07 DIAGNOSIS — I480 Paroxysmal atrial fibrillation: Secondary | ICD-10-CM

## 2019-03-07 DIAGNOSIS — R079 Chest pain, unspecified: Secondary | ICD-10-CM | POA: Diagnosis not present

## 2019-03-07 LAB — BASIC METABOLIC PANEL
BUN/Creatinine Ratio: 9 — ABNORMAL LOW (ref 10–24)
BUN: 10 mg/dL (ref 8–27)
CO2: 26 mmol/L (ref 20–29)
Calcium: 10.2 mg/dL (ref 8.6–10.2)
Chloride: 103 mmol/L (ref 96–106)
Creatinine, Ser: 1.09 mg/dL (ref 0.76–1.27)
GFR calc Af Amer: 72 mL/min/{1.73_m2} (ref >59)
GFR calc non Af Amer: 62 mL/min/{1.73_m2} (ref >59)
Glucose: 79 mg/dL (ref 65–99)
Potassium: 4.5 mmol/L (ref 3.5–5.2)
Sodium: 141 mmol/L (ref 134–144)

## 2019-03-07 NOTE — Telephone Encounter (Signed)
Left message for patient to return call.

## 2019-03-07 NOTE — Progress Notes (Signed)
  Echocardiogram 2D Echocardiogram has been performed.  Wayne Mcguire 03/07/2019, 11:12 AM

## 2019-03-13 ENCOUNTER — Telehealth: Payer: Self-pay | Admitting: Cardiology

## 2019-03-13 NOTE — Telephone Encounter (Signed)
I spoke with patient's wife. She reports patient started metoprolol tartrate on 1/13. This past Sunday he started to become very dizzy. Had to hold onto walls or his wife to walk. Stopped metoprolol after evening dose on Sunday. On Monday he had some dizziness in the morning but it improved as the day went on. Today he had slight dizziness when he first got up. It is better now and he is able to walk without problems.  He is staying hydrated. Was told in the past to drink gatorade and eat extra salt due to low BP so he has been doing this. Can check BP at home but has not been checking. I told patient's wife that patient should remain off metoprolol for now. I asked her to check patient's BP twice daily for next several days.  Will forward to Dr Agustin Cree for review/recommendations.

## 2019-03-13 NOTE — Telephone Encounter (Signed)
Pt c/o medication issue:  1. Name of Medication: metoprolol tartrate (LOPRESSOR) 25 MG tablet  2. How are you currently taking this medication (dosage and times per day)? Take 1 tablet (25 mg total) by mouth 2 (two) times daily.  3. Are you having a reaction (difficulty breathing--STAT)? No  4. What is your medication issue? Patient's wife states patient is having trouble walking and can barely hold himself up due to medication intake.  Patient's wife states patient has stopped taking Metoprolol due to extreme side effects. Please advise.

## 2019-03-13 NOTE — Telephone Encounter (Signed)
I spoke with patient's wife. She reports BP is 110/70 and pulse 61. Patient will stay off metoprolol and wife aware to call if patient's condition worsens.  She will call back with BP readings in about a week.

## 2019-03-13 NOTE — Telephone Encounter (Signed)
Good advice.  Please ask him to let us know if situation will get worse

## 2019-03-29 DIAGNOSIS — M159 Polyosteoarthritis, unspecified: Secondary | ICD-10-CM

## 2019-03-29 DIAGNOSIS — M8949 Other hypertrophic osteoarthropathy, multiple sites: Secondary | ICD-10-CM

## 2019-03-29 DIAGNOSIS — M150 Primary generalized (osteo)arthritis: Secondary | ICD-10-CM

## 2019-03-29 HISTORY — DX: Other hypertrophic osteoarthropathy, multiple sites: M89.49

## 2019-03-29 HISTORY — DX: Polyosteoarthritis, unspecified: M15.9

## 2019-03-29 HISTORY — DX: Primary generalized (osteo)arthritis: M15.0

## 2019-04-18 DIAGNOSIS — J0140 Acute pansinusitis, unspecified: Secondary | ICD-10-CM

## 2019-04-18 HISTORY — DX: Acute pansinusitis, unspecified: J01.40

## 2019-05-15 ENCOUNTER — Other Ambulatory Visit: Payer: Self-pay | Admitting: Orthopaedic Surgery

## 2019-05-15 DIAGNOSIS — M25512 Pain in left shoulder: Secondary | ICD-10-CM

## 2019-05-18 ENCOUNTER — Telehealth: Payer: Self-pay | Admitting: Emergency Medicine

## 2019-05-18 NOTE — Telephone Encounter (Signed)
   [facility] Pre-operative Risk Assessment    Request for surgical clearance:  1. What type of surgery is being performed? Left total shoulder replacement   2. When is this surgery scheduled? No date at this time    3. What type of clearance is required (medical clearance vs. Pharmacy clearance to hold med vs. Both)? Medical   4. Are there any medications that need to be held prior to surgery and how long? None specified    5. Practice name and name of physician performing surgery? [facility], Ophelia Charter, MD   6. What is your office phone number [phone]    7.   What is your office fax number [phone]  8.   Anesthesia type (None, local, MAC, general) ? Note specified    Wayne Mcguire 05/18/2019, 4:02 PM  _________________________________________________________________   (provider comments below)

## 2019-05-21 NOTE — Telephone Encounter (Signed)
   Primary Cardiologist: Shirlee More, MD  Chart reviewed as part of pre-operative protocol coverage. Patient was contacted 05/21/2019 in reference to pre-operative risk assessment for pending surgery as outlined below.  Wayne Mcguire was last seen on 03/06/19 by Dr. Agustin Cree.  Since that day, Wayne Mcguire has done well form cardiac perspective with his PAF and hx of neg stress test.  On phone today with pt and wife he has not chest pain only the Lt shoulder pain.  He is on Eliquis and he is recommended to hold Eliquis 3 days prior to procedure and resume post op.  Therefore, based on ACC/AHA guidelines, the patient would be at acceptable risk for the planned procedure without further cardiovascular testing.   I will route this recommendation to the requesting party via Epic fax function and remove from pre-op pool.  Please call with questions.  Cecilie Kicks, NP 05/21/2019, 2:28 PM

## 2019-05-21 NOTE — Telephone Encounter (Signed)
Pharm please address eliquis thanks 

## 2019-05-21 NOTE — Telephone Encounter (Signed)
Patient with diagnosis of atrial fibrillation on Eliquis for anticoagulation.    Procedure: left total shoulder rotaleplacement Date of procedure: TBD  CHADS2-VASc score of  2 (AGE x 2)  CrCl 57.5  Platelet count 212 (Feb 2020)  Per office protocol, patient can hold Eliquis for 3 days prior to procedure.   Patient will not need bridging with Lovenox (enoxaparin) around procedure.  For orthopedic procedures please be sure to resume therapeutic (not prophylactic) dosing.

## 2019-05-21 NOTE — Telephone Encounter (Signed)
LM for pt to call back.

## 2019-05-21 NOTE — Telephone Encounter (Signed)
Follow up   Patient's wife is returning your call. Please call. 

## 2019-05-31 ENCOUNTER — Ambulatory Visit
Admission: RE | Admit: 2019-05-31 | Discharge: 2019-05-31 | Disposition: A | Discharge status: Home / Self Care | Payer: Medicare HMO | Source: Ambulatory Visit | Attending: Orthopaedic Surgery | Admitting: Orthopaedic Surgery

## 2019-05-31 DIAGNOSIS — M25512 Pain in left shoulder: Secondary | ICD-10-CM

## 2019-06-05 ENCOUNTER — Telehealth: Payer: Self-pay | Admitting: Cardiology

## 2019-06-05 NOTE — Telephone Encounter (Signed)
Patient's wife, Jana Half, states she is requesting to accompany the patient during his appointment scheduled for 06/08/19 at 11:00 AM with Dr. Agustin Cree due to hearing impairment. Please advise.

## 2019-06-06 IMAGING — DX DG CHEST 2V
3 series · 3 of 3 positions shown · non-contrast
Comparison: 12/19/2013

CLINICAL DATA: Left-sided chest pain and dyspnea.

EXAM:
CHEST  2 VIEW

[chest pa]
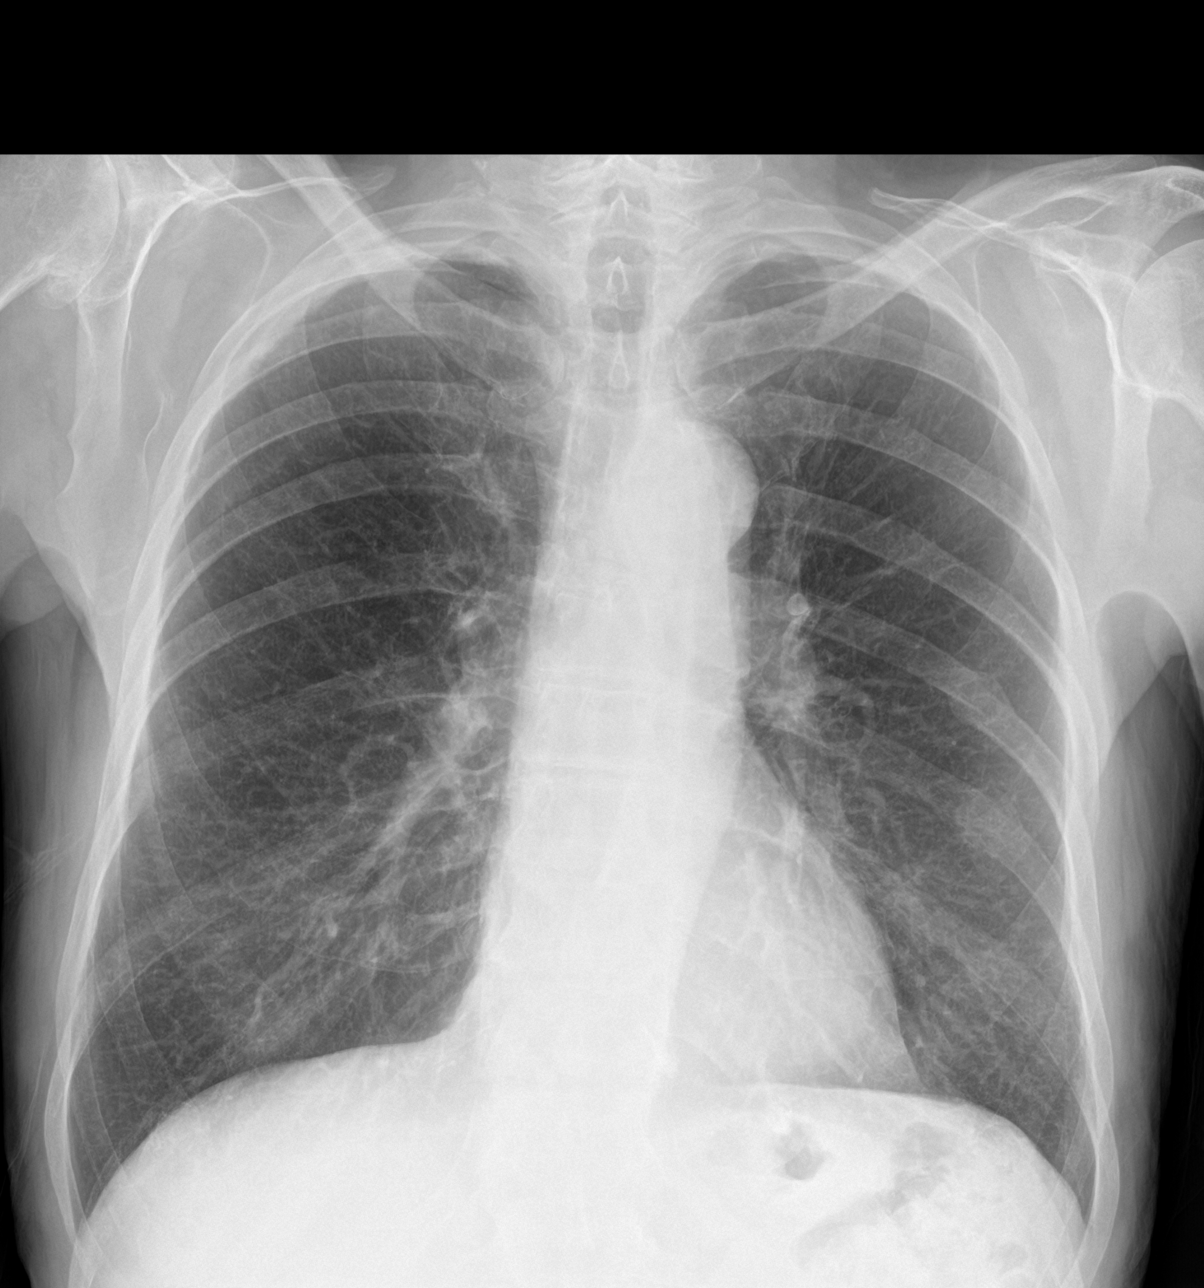

[chest lat (1 of 2)]
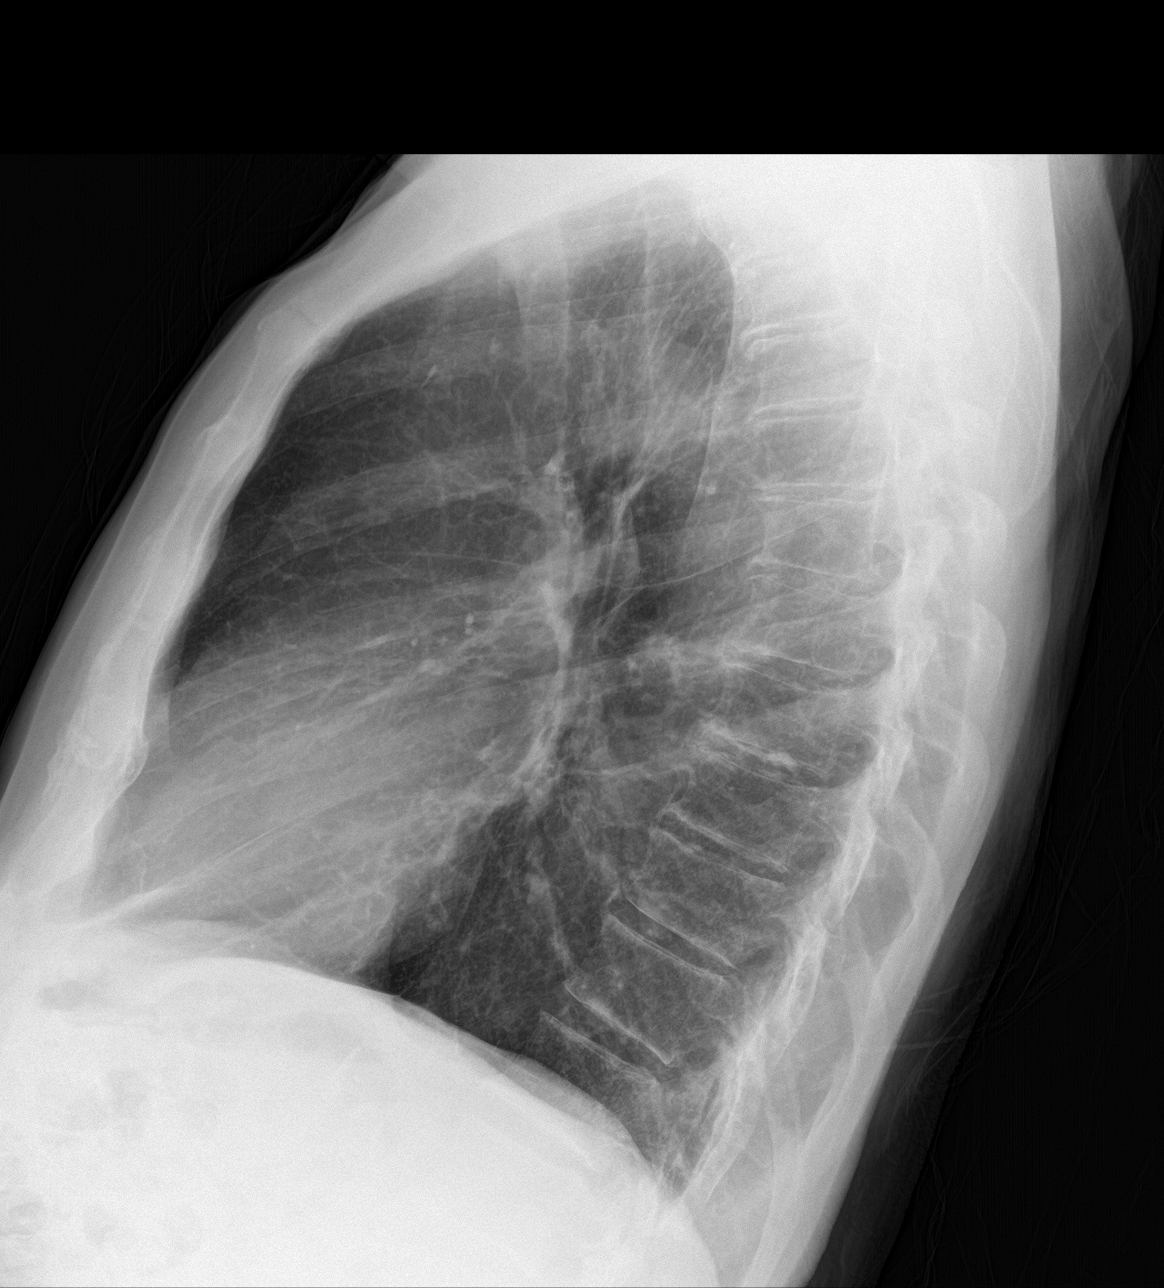

[chest lat (2 of 2)]
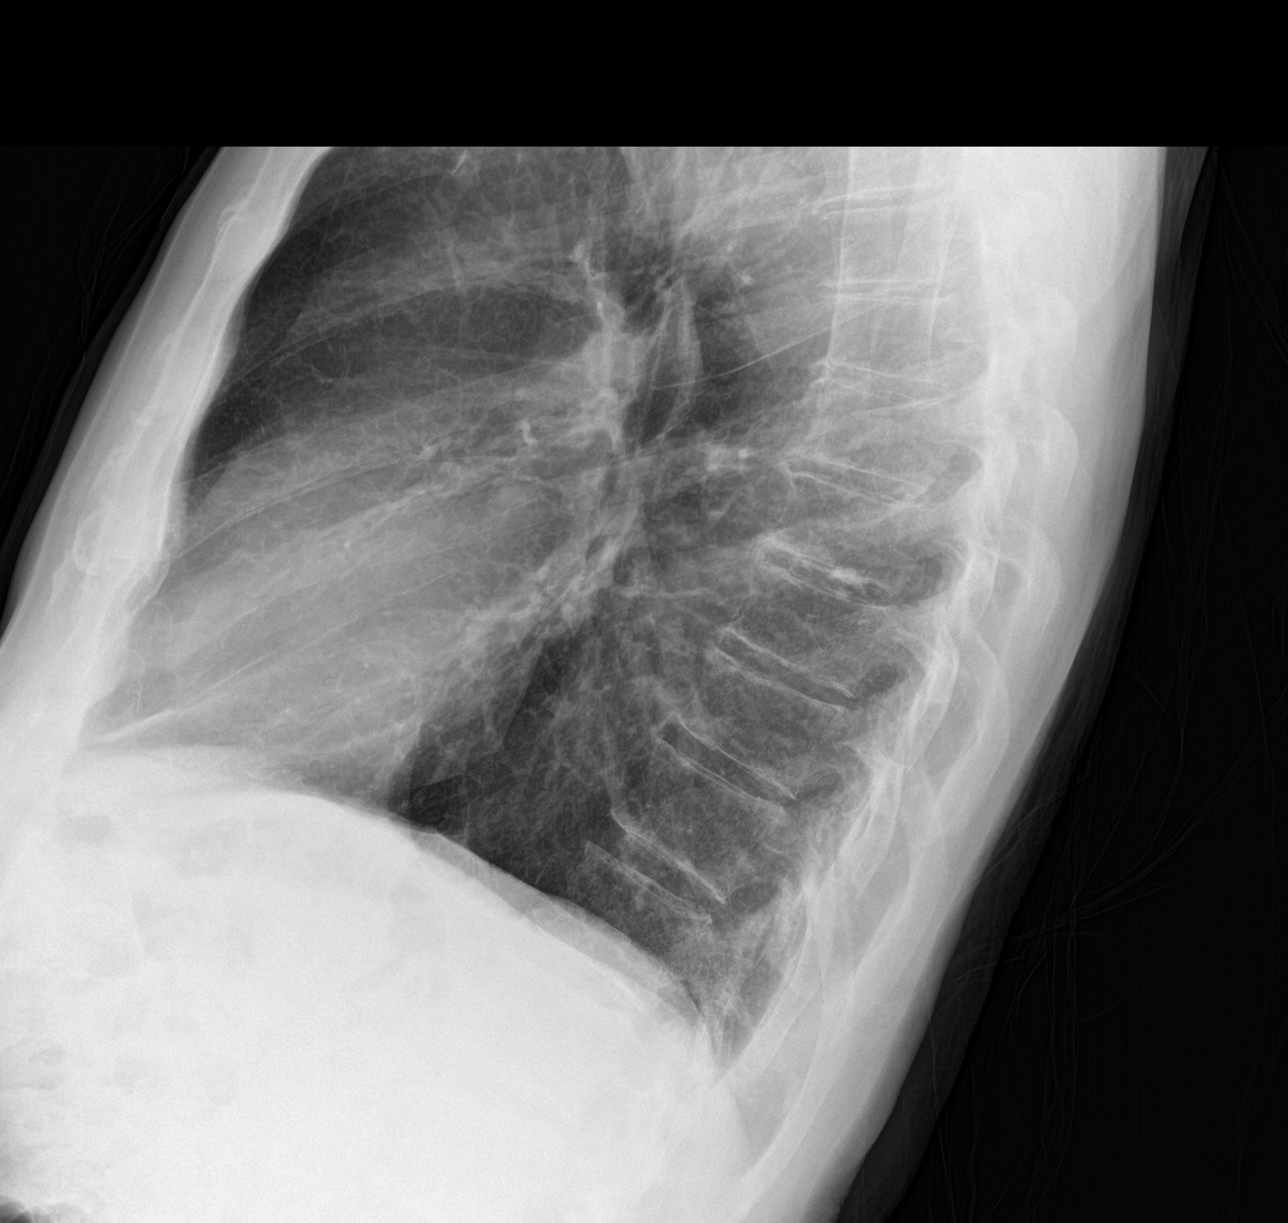

[3 of 3 positions shown; findings below may reference images not displayed]

FINDINGS: The heart size and mediastinal contours are within normal limits.
Aortic atherosclerosis at the arch. Mild hyperinflation of the lungs
without pneumonic consolidation, CHF, dominant mass nor effusion.
Chronic left posterior seventh and eighth rib fracture worse with
callus. No acute nor suspicious osseous abnormalities.
IMPRESSION: 1. No active cardiopulmonary disease.
2. Mild emphysematous hyperinflation of the lungs.
3. Aortic atherosclerosis.

## 2019-06-06 NOTE — Telephone Encounter (Signed)
That is fine with me.

## 2019-06-06 NOTE — Telephone Encounter (Signed)
Left message stating wife can accompany patient for appointment. Advised them to call with any questions.

## 2019-06-08 ENCOUNTER — Encounter: Payer: Self-pay | Admitting: Cardiology

## 2019-06-08 ENCOUNTER — Other Ambulatory Visit: Payer: Self-pay

## 2019-06-08 ENCOUNTER — Ambulatory Visit: Payer: Medicare HMO | Admitting: Cardiology

## 2019-06-08 VITALS — BP 88/68 | HR 64 | Ht 73.0 in | Wt 185.0 lb

## 2019-06-08 DIAGNOSIS — Z7901 Long term (current) use of anticoagulants: Secondary | ICD-10-CM | POA: Diagnosis not present

## 2019-06-08 DIAGNOSIS — I480 Paroxysmal atrial fibrillation: Secondary | ICD-10-CM

## 2019-06-08 DIAGNOSIS — I472 Ventricular tachycardia, unspecified: Secondary | ICD-10-CM

## 2019-06-08 MED ORDER — APIXABAN 5 MG PO TABS: 5.0000 mg | ORAL_TABLET | Freq: Two times a day (BID) | ORAL | 1 refills | Status: DC

## 2019-06-08 NOTE — Patient Instructions (Signed)
Medication Instructions:  Your physician recommends that you continue on your current medications as directed. Please refer to the Current Medication list given to you today.  *If you need a refill on your cardiac medications before your next appointment, please call your pharmacy*   Lab Work: FASTING LIPID PANEL If you have labs (blood work) drawn today and your tests are completely normal, you will receive your results only by: Marland Kitchen MyChart Message (if you have MyChart) OR . A paper copy in the mail If you have any lab test that is abnormal or we need to change your treatment, we will call you to review the results.   Testing/Procedures: NONE    Follow-Up: At [facility], you and your health needs are our priority.  As part of our continuing mission to provide you with exceptional heart care, we have created designated Provider Care Teams.  These Care Teams include your primary Cardiologist (physician) and Advanced Practice Providers (APPs -  Physician Assistants and Nurse Practitioners) who all work together to provide you with the care you need, when you need it.  We recommend signing up for the patient portal called "MyChart".  Sign up information is provided on this After Visit Summary.  MyChart is used to connect with patients for Virtual Visits (Telemedicine).  Patients are able to view lab/test results, encounter notes, upcoming appointments, etc.  Non-urgent messages can be sent to your provider as well.   To learn more about what you can do with MyChart, go to [email or web address].    Your next appointment:   5 month(s)  The format for your next appointment:   In Person  Provider:   [name], MD

## 2019-06-08 NOTE — Progress Notes (Signed)
Cardiology Office Note:    Date:  06/08/2019   ID:  Wayne Mcguire, DOB [birth year], MRN [id number]  PCP:  [name], MD  Cardiologist:  [name], MD    Referring MD: [name], MD   Chief Complaint  Patient presents with  . Follow-up  I have a lot of pain in the left shoulder  History of Present Illness:    Wayne Mcguire is a 86 y.o. male paroxysmal atrial fibrillation, anxiety, atypical chest pain.  He is being aggressively evaluated for chest pain/shoulder pain.  Stress test was negative, echocardiogram showed preserved left ventricle ejection fraction.  He also wore monitor which showed some nonsustained ventricular tachycardia.  However risk stratification of this arrhythmia was slightly low.  He is managed conservatively.  He did see orthopedic doctor who identified problem his left shoulder and surgery for shoulder replacement is contemplated.  He is actually scheduled to have surgery within the next few weeks.  Cardiac wise doing well.  Denies having exertional chest pain tightness squeezing pressure burning chest.  Past Medical History:  Diagnosis Date  . Acute non-recurrent pansinusitis 04/18/2019  . 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 Adult situational stress disorder   . Anxiety   . Bladder cancer ([place])   . Chest pain in adult 08/23/2016   Stress echo test done 09/05/15 Normal at 7 mets      . Chronic anticoagulation 11/15/2016  . Chronic anxiety 09/09/2016  . Chronic insomnia 09/09/2016  . Chronic pain of both knees 10/06/2017  . Chronic pain of both shoulders 09/14/2018  . Constipation 10/02/2018  . Costochondral chest pain 09/07/2016  . Cough with hemoptysis 01/06/2017  . Depression   . Dysuria 01/06/2017  . Esophagitis   . GERD (gastroesophageal reflux disease)   . GERD without esophagitis 09/09/2016  . High risk medication use 08/23/2016   Flecanide  . Hypotension 03/10/2018  . Low back pain 11/06/2018  . Lung nodule < 6cm on CT 09/30/2017  . Malaise and  fatigue 09/30/2017  . Medicare annual wellness visit, subsequent 09/30/2017  . Orchitis 11/06/2018  . PAF (paroxysmal atrial fibrillation) ([place]) 08/23/2016   CHADS2 vasc=2, he has elected not to accept anticoagulation  . Penile bleeding 10/02/2018  . Penile discharge 11/06/2018  . Peripheral edema 11/06/2018  . 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 Productive cough 11/06/2018  . Trochanteric bursitis of left hip 10/06/2017  . Unsteady gait 04/03/2018  . Ventricular tachycardia ([place]) 03/06/2019  . Vertigo   . Vitamin D deficiency 09/30/2017    Past Surgical History:  Procedure Laterality Date  . BLADDER SURGERY    . VEIN SURGERY      Current Medications: Current Meds  Medication Sig  . acetaminophen (TYLENOL 8 HOUR) 650 MG CR tablet Take 650 mg by mouth 2 (two) times daily.  . Cholecalciferol (VITAMIN D-3) 125 MCG (5000 UT) TABS Take 1 tablet by mouth daily.  Marland Kitchen CRANBERRY PO Take 1 tablet by mouth daily.  Marland Kitchen Dextromethorphan-guaiFENesin (MUCINEX DM PO) Take 1 tablet by mouth 2 (two) times daily.  Marland Kitchen ELIQUIS 5 MG TABS tablet TAKE 1 TABLET BY MOUTH TWICE DAILY  . fluticasone (FLONASE) 50 MCG/ACT nasal spray Place 1 spray into both nostrils daily.   . magnesium oxide (MAG-OX) 400 MG tablet Take 400 mg by mouth daily.  . Multiple Vitamin (MULTIVITAMIN) tablet Take 1 tablet by mouth daily.  . Multiple Vitamins-Minerals (HAIR SKIN AND NAILS FORMULA) TABS Take 1 tablet by mouth daily.  Marland Kitchen  Multiple Vitamins-Minerals (ICAPS AREDS 2 PO) Take 2 capsules by mouth daily.   . nitroGLYCERIN (NITROSTAT) 0.4 MG SL tablet Place 1 tablet (0.4 mg total) under the tongue every 5 (five) minutes as needed for chest pain.  Marland Kitchen olopatadine (PATADAY) 0.1 % ophthalmic solution Place 1 drop into both eyes daily as needed for allergies.  Marland Kitchen OVER THE COUNTER MEDICATION Take 1 capsule by mouth in the morning and at bedtime. IB Gard  . Probiotic Product (PROBIOTIC DAILY) CAPS Take 1 capsule by mouth daily.  .  traMADol (ULTRAM) 50 MG tablet Take 50 mg by mouth 2 (two) times daily.  . traZODone (DESYREL) 150 MG tablet Take 150 mg by mouth at bedtime.   . [DISCONTINUED] olopatadine (PATANOL) 0.1 % ophthalmic solution Place 1 drop into both eyes daily as needed.     Allergies:   Terbinafine and related   Social History   Socioeconomic History  . Marital status: Married    Spouse name: Not on file  . Number of children: Not on file  . Years of education: Not on file  . Highest education level: Not on file  Occupational History  . Not on file  Tobacco Use  . Smoking status: Former Smoker    Packs/day: 1.00    Years: 70.00    Pack years: 70.00    Types: Cigarettes    Quit date: 04/2018    Years since quitting: 1.1  . Smokeless tobacco: Never Used  Substance and Sexual Activity  . Alcohol use: No  . Drug use: No  . Sexual activity: Not on file  Other Topics Concern  . Not on file  Social History Narrative  . Not on file   Social Determinants of Health   Financial Resource Strain:   . Difficulty of Paying Living Expenses:   Food Insecurity:   . Worried About Charity fundraiser in the Last Year:   . Arboriculturist in the Last Year:   Transportation Needs:   . Film/video editor (Medical):   Marland Kitchen Lack of Transportation (Non-Medical):   Physical Activity:   . Days of Exercise per Week:   . Minutes of Exercise per Session:   Stress:   . Feeling of Stress :   Social Connections:   . Frequency of Communication with Friends and Family:   . Frequency of Social Gatherings with Friends and Family:   . Attends Religious Services:   . Active Member of Clubs or Organizations:   . Attends Archivist Meetings:   Marland Kitchen Marital Status:      Family History: The patient's family history includes Heart attack in his brother; Heart failure in his father. ROS:   Please see the history of present illness.    All 14 point review of systems negative except as described per history of  present illness  EKGs/Labs/Other Studies Reviewed:      Recent Labs: 03/06/2019: BUN 10; Creatinine, Ser 1.09; Potassium 4.5; Sodium 141  Recent Lipid Panel No results found for: CHOL, TRIG, HDL, CHOLHDL, VLDL, LDLCALC, LDLDIRECT  Physical Exam:    VS:  BP (!) 88/68 (BP Location: Left Arm, Patient Position: Sitting, Cuff Size: Normal)   Pulse 64   Ht 6\' 1"  (1.854 m)   Wt 185 lb (83.9 kg)   SpO2 97%   BMI 24.41 kg/m     Wt Readings from Last 3 Encounters:  06/08/19 185 lb (83.9 kg)  03/06/19 181 lb (82.1 kg)  02/01/19 178  lb (80.7 kg)     GEN:  Well nourished, well developed in no acute distress HEENT: Normal NECK: No JVD; No carotid bruits LYMPHATICS: No lymphadenopathy CARDIAC: RRR, no murmurs, no rubs, no gallops RESPIRATORY:  Clear to auscultation without rales, wheezing or rhonchi  ABDOMEN: Soft, non-tender, non-distended MUSCULOSKELETAL:  No edema; No deformity  SKIN: Warm and dry LOWER EXTREMITIES: no swelling NEUROLOGIC:  Alert and oriented x 3 PSYCHIATRIC:  Normal affect   ASSESSMENT:    1. Ventricular tachycardia ([place])   2. PAF (paroxysmal atrial fibrillation) ([place])   3. Chronic anticoagulation    PLAN:    In order of problems listed above:  1. Ventricular tachycardia.  Echocardiogram showed preserved left ventricle ejection fraction, stress test negative, he is without any symptoms denies having any dizziness or passing out.  We will continue conservative approach. 2. Paroxysmal atrial fibrillation.  He is anticoagulated Eliquis which I will continue.  Denies having any recent palpitations. 3. Chronic anticoagulation, Eliquis, last creatinine I have is within normal limits 1.09.  Therefore the dose of 5 mg twice daily is appropriate. 4. Dyslipidemia.  I will schedule him to have fasting lipid profile done. 5. Preop evaluation: From cardiac point of view he is stable to proceed with surgery as scheduled.  His Eliquis need to be withdrawn 2 days before  procedure.  To be restarted as quickly as possible preoperatively thereafter.   Medication Adjustments/Labs and Tests Ordered: Current medicines are reviewed at length with the patient today.  Concerns regarding medicines are outlined above.  No orders of the defined types were placed in this encounter.  Medication changes: No orders of the defined types were placed in this encounter.   Signed, Park Liter, MD, [facility] 06/08/2019 11:14 AM    [place]

## 2019-06-08 NOTE — Addendum Note (Signed)
Addended by: Gar Ponto on: 06/08/2019 11:43 AM   Modules accepted: Orders

## 2019-06-11 ENCOUNTER — Encounter (HOSPITAL_COMMUNITY): Payer: Self-pay

## 2019-06-11 NOTE — Patient Instructions (Addendum)
DUE TO COVID-19 ONLY TWO VISITORS ARE ALLOWED TO COME WITH YOU AND STAY IN THE WAITING ROOM ONLY DURING PRE OP AND PROCEDURE. THE TWO VISITORS MAY VISIT WITH YOU IN YOUR PRIVATE ROOM DURING VISITING HOURS ONLY!!   COVID SWAB TESTING MUST BE COMPLETED ON:   Saturday, June 16, 2019 at  11:10 AM [address] [place] [facility] enter pre surgical testing line (Must self quarantine after testing. Follow instructions on handout.)             Your procedure is scheduled on: Wednesday, June 20, 2019   Report to [facility] Main  Entrance    Report to admitting at 10:55 AM   Call this number if you have problems the morning of surgery [phone]   Do not eat food:After Midnight.   May have liquids until 10:25 AM day of surgery   CLEAR LIQUID DIET  Foods Allowed                                                                     Foods Excluded  Water, Black Coffee and tea, regular and decaf                             liquids that you cannot  Plain Jell-O in any flavor  (No red)                                           see through such as: Fruit ices (not with fruit pulp)                                     milk, soups, orange juice  Iced Popsicles (No red)                                    All solid food Carbonated beverages, regular and diet                                    Apple juices Sports drinks like Gatorade (No red) Lightly seasoned clear broth or consume(fat free) Sugar, honey syrup  Sample Menu Breakfast                                Lunch                                     Supper Cranberry juice                    Beef broth                            Chicken broth Jell-O  Grape juice                           Apple juice Coffee or tea                        Jell-O                                      Popsicle                                                Coffee or tea                         Coffee or tea   Complete one Ensure drink the morning of surgery at  10:25 AM the day of surgery.   Oral Hygiene is also important to reduce your risk of infection.                                    Remember - BRUSH YOUR TEETH THE MORNING OF SURGERY WITH YOUR REGULAR TOOTHPASTE   Do NOT smoke after Midnight   Take these medicines the morning of surgery with A SIP OF WATER: Prilosec, Tramadol if needed   May use Flonase day of surgery                               You may not have any metal on your body including jewelry, and body piercings             Do not wear lotions, powders, perfumes/cologne, or deodorant                           Men may shave face and neck.   Do not bring valuables to the hospital. [place].   Contacts, dentures or bridgework may not be worn into surgery.   Bring small overnight bag day of surgery.    Patients discharged the day of surgery will not be allowed to drive home.   Special Instructions: Bring a copy of your healthcare power of attorney and living will documents         the day of surgery if you haven't scanned them in before.              Please read over the following fact sheets you were given: IF YOU HAVE QUESTIONS ABOUT YOUR PRE OP INSTRUCTIONS PLEASE CALL [place]- Preparing for Total Shoulder Arthroplasty    Before surgery, you can play an important role. Because skin is not sterile, your skin needs to be as free of germs as possible. You can reduce the number of germs on your skin by using the following products. . Benzoyl Peroxide Gel o Reduces the number of germs present on the skin o Applied twice a day to shoulder area starting two days before surgery    ==================================================================  Please follow these instructions carefully:  BENZOYL PEROXIDE 5% GEL  Please do not use if you have an allergy to benzoyl peroxide.   If your  skin becomes reddened/irritated stop using the benzoyl peroxide.  Starting two days before surgery, apply as follows: Monday and Tuesday  1. Apply benzoyl peroxide in the morning and at night. Apply after taking a shower. If you are not taking a shower clean entire shoulder front, back, and side along with the armpit with a clean wet washcloth.  2. Place a quarter-sized dollop on your shoulder and rub in thoroughly, making sure to cover the front, back, and side of your shoulder, along with the armpit.   2 days before ____ AM   ____ PM              1 day before ____ AM   ____ PM                         3. Do this twice a day for two days.  (Last application is the night before surgery, AFTER using the CHG soap as described below).  4. Do NOT apply benzoyl peroxide gel on the day of surgery.    [place] - Preparing for Surgery Before surgery, you can play an important role.  Because skin is not sterile, your skin needs to be as free of germs as possible.  You can reduce the number of germs on your skin by washing with CHG (chlorahexidine gluconate) soap before surgery.  CHG is an antiseptic cleaner which kills germs and bonds with the skin to continue killing germs even after washing. Please DO NOT use if you have an allergy to CHG or antibacterial soaps.  If your skin becomes reddened/irritated stop using the CHG and inform your nurse when you arrive at Short Stay. Do not shave (including legs and underarms) for at least 48 hours prior to the first CHG shower.  You may shave your face/neck.  Please follow these instructions carefully:  1.  Shower with CHG Soap the night before surgery and the  morning of surgery.  2.  If you choose to wash your hair, wash your hair first as usual with your normal  shampoo.  3.  After you shampoo, rinse your hair and body thoroughly to remove the shampoo.                             4.  Use CHG as you would any other liquid soap.  You can apply chg directly  to the skin and wash.  Gently with a scrungie or clean washcloth.  5.  Apply the CHG Soap to your body ONLY FROM THE NECK DOWN.   Do   not use on face/ open                           Wound or open sores. Avoid contact with eyes, ears mouth and   genitals (private parts).                       Wash face,  Genitals (private parts) with your normal soap.             6.  Wash thoroughly, paying special attention to the area where your    surgery  will be performed.  7.  Thoroughly rinse your  body with warm water from the neck down.  8.  DO NOT shower/wash with your normal soap after using and rinsing off the CHG Soap.                9.  Pat yourself dry with a clean towel.            10.  Wear clean pajamas.            11.  Place clean sheets on your bed the night of your first shower and do not  sleep with pets. Day of Surgery : Do not apply any lotions/deodorants the morning of surgery.  Please wear clean clothes to the hospital/surgery center.  FAILURE TO FOLLOW THESE INSTRUCTIONS MAY RESULT IN THE CANCELLATION OF YOUR SURGERY  PATIENT SIGNATURE_________________________________  NURSE SIGNATURE__________________________________  ________________________________________________________________________   Adam Phenix  An incentive spirometer is a tool that can help keep your lungs clear and active. This tool measures how well you are filling your lungs with each breath. Taking long deep breaths may help reverse or decrease the chance of developing breathing (pulmonary) problems (especially infection) following:  A long period of time when you are unable to move or be active. BEFORE THE PROCEDURE   If the spirometer includes an indicator to show your best effort, your nurse or respiratory therapist will set it to a desired goal.  If possible, sit up straight or lean slightly forward. Try not to slouch.  Hold the incentive spirometer in an upright position. INSTRUCTIONS FOR USE   1. Sit on the edge of your bed if possible, or sit up as far as you can in bed or on a chair. 2. Hold the incentive spirometer in an upright position. 3. Breathe out normally. 4. Place the mouthpiece in your mouth and seal your lips tightly around it. 5. Breathe in slowly and as deeply as possible, raising the piston or the ball toward the top of the column. 6. Hold your breath for 3-5 seconds or for as long as possible. Allow the piston or ball to fall to the bottom of the column. 7. Remove the mouthpiece from your mouth and breathe out normally. 8. Rest for a few seconds and repeat Steps 1 through 7 at least 10 times every 1-2 hours when you are awake. Take your time and take a few normal breaths between deep breaths. 9. The spirometer may include an indicator to show your best effort. Use the indicator as a goal to work toward during each repetition. 10. After each set of 10 deep breaths, practice coughing to be sure your lungs are clear. If you have an incision (the cut made at the time of surgery), support your incision when coughing by placing a pillow or rolled up towels firmly against it. Once you are able to get out of bed, walk around indoors and cough well. You may stop using the incentive spirometer when instructed by your caregiver.  RISKS AND COMPLICATIONS  Take your time so you do not get dizzy or light-headed.  If you are in pain, you may need to take or ask for pain medication before doing incentive spirometry. It is harder to take a deep breath if you are having pain. AFTER USE  Rest and breathe slowly and easily.  It can be helpful to keep track of a log of your progress. Your caregiver can provide you with a simple table to help with this. If you are using the spirometer at home, follow  these instructions: SEEK MEDICAL CARE IF:   You are having difficultly using the spirometer.  You have trouble using the spirometer as often as instructed.  Your pain medication is  not giving enough relief while using the spirometer.  You develop fever of 100.5 F (38.1 C) or higher. SEEK IMMEDIATE MEDICAL CARE IF:   You cough up bloody sputum that had not been present before.  You develop fever of 102 F (38.9 C) or greater.  You develop worsening pain at or near the incision site. MAKE SURE YOU:   Understand these instructions.  Will watch your condition.  Will get help right away if you are not doing well or get worse. Document Released: 06/21/2006 Document Revised: 05/03/2011 Document Reviewed: 08/22/2006 [facility] Patient Information 2014 [place], [place].   ________________________________________________________________________

## 2019-06-13 ENCOUNTER — Other Ambulatory Visit: Payer: Self-pay

## 2019-06-13 ENCOUNTER — Encounter (HOSPITAL_COMMUNITY): Payer: Self-pay

## 2019-06-13 ENCOUNTER — Encounter (HOSPITAL_COMMUNITY)
Admission: RE | Admit: 2019-06-13 | Discharge: 2019-06-13 | Disposition: A | Discharge status: Home / Self Care | Payer: Medicare HMO | Source: Ambulatory Visit | Attending: Orthopaedic Surgery | Admitting: Orthopaedic Surgery

## 2019-06-13 DIAGNOSIS — M19012 Primary osteoarthritis, left shoulder: Secondary | ICD-10-CM | POA: Insufficient documentation

## 2019-06-13 DIAGNOSIS — Z01812 Encounter for preprocedural laboratory examination: Secondary | ICD-10-CM | POA: Insufficient documentation

## 2019-06-13 DIAGNOSIS — I350 Nonrheumatic aortic (valve) stenosis: Secondary | ICD-10-CM | POA: Insufficient documentation

## 2019-06-13 DIAGNOSIS — Z7901 Long term (current) use of anticoagulants: Secondary | ICD-10-CM | POA: Insufficient documentation

## 2019-06-13 DIAGNOSIS — K219 Gastro-esophageal reflux disease without esophagitis: Secondary | ICD-10-CM | POA: Insufficient documentation

## 2019-06-13 DIAGNOSIS — Z87891 Personal history of nicotine dependence: Secondary | ICD-10-CM | POA: Insufficient documentation

## 2019-06-13 DIAGNOSIS — I480 Paroxysmal atrial fibrillation: Secondary | ICD-10-CM | POA: Insufficient documentation

## 2019-06-13 DIAGNOSIS — Z79899 Other long term (current) drug therapy: Secondary | ICD-10-CM | POA: Insufficient documentation

## 2019-06-13 HISTORY — DX: Other intervertebral disc displacement, lumbar region: M51.26

## 2019-06-13 HISTORY — DX: Emphysema, unspecified: J43.9

## 2019-06-13 HISTORY — DX: Other seasonal allergic rhinitis: J30.2

## 2019-06-13 HISTORY — DX: Other ventricular tachycardia: I47.29

## 2019-06-13 HISTORY — DX: Ventricular tachycardia: I47.2

## 2019-06-13 NOTE — Progress Notes (Signed)
COVID 19 Vaccine series is completed  PCP - Dr. Patsy [place] Cardiologist - Dr. Nelta Numbers Last office visit and clearance note 06/08/19 in epic  Chest x-ray - 04/18/19 care everywhere EKG - 01/23/19 in epic Stress Test - 02/01/19 in epic ECHO - 03/07/19 in epic Cardiac Cath - N/A  Sleep Study - N/A CPAP - N/A  Fasting Blood Sugar - N/A Checks Blood Sugar __N/A___ times a day  Blood Thinner Instructions: Eliquis last dose 06/17/19 Aspirin Instructions: N/A Last Dose:N/A  Anesthesia review: Afib, empysema  Patient denies shortness of breath, fever, cough and chest pain at PAT appointment   Patient verbalized understanding of instructions that were given to them at the PAT appointment. Patient was also instructed that they will need to review over the PAT instructions again at home before surgery.

## 2019-06-14 ENCOUNTER — Encounter (HOSPITAL_COMMUNITY)
Admission: RE | Admit: 2019-06-14 | Discharge: 2019-06-14 | Disposition: A | Discharge status: Home / Self Care | Payer: Medicare HMO | Source: Ambulatory Visit | Attending: Orthopaedic Surgery | Admitting: Orthopaedic Surgery

## 2019-06-14 DIAGNOSIS — Z01812 Encounter for preprocedural laboratory examination: Secondary | ICD-10-CM | POA: Diagnosis not present

## 2019-06-14 DIAGNOSIS — M19012 Primary osteoarthritis, left shoulder: Secondary | ICD-10-CM | POA: Diagnosis not present

## 2019-06-14 DIAGNOSIS — I480 Paroxysmal atrial fibrillation: Secondary | ICD-10-CM | POA: Diagnosis not present

## 2019-06-14 DIAGNOSIS — K219 Gastro-esophageal reflux disease without esophagitis: Secondary | ICD-10-CM | POA: Diagnosis not present

## 2019-06-14 DIAGNOSIS — Z7901 Long term (current) use of anticoagulants: Secondary | ICD-10-CM | POA: Diagnosis not present

## 2019-06-14 DIAGNOSIS — Z87891 Personal history of nicotine dependence: Secondary | ICD-10-CM | POA: Diagnosis not present

## 2019-06-14 DIAGNOSIS — Z79899 Other long term (current) drug therapy: Secondary | ICD-10-CM | POA: Diagnosis not present

## 2019-06-14 DIAGNOSIS — I350 Nonrheumatic aortic (valve) stenosis: Secondary | ICD-10-CM | POA: Diagnosis not present

## 2019-06-14 LAB — BASIC METABOLIC PANEL
Anion gap: 7 (ref 5–15)
BUN: 10 mg/dL (ref 8–23)
CO2: 28 mmol/L (ref 22–32)
Calcium: 9.2 mg/dL (ref 8.9–10.3)
Chloride: 105 mmol/L (ref 98–111)
Creatinine, Ser: 0.97 mg/dL (ref 0.61–1.24)
GFR calc Af Amer: >60 mL/min (ref >60)
GFR calc non Af Amer: >60 mL/min (ref >60)
Glucose, Bld: 88 mg/dL (ref 70–99)
Potassium: 4 mmol/L (ref 3.5–5.1)
Sodium: 140 mmol/L (ref 135–145)

## 2019-06-14 LAB — CBC
HCT: 39.5 % (ref 39.0–52.0)
Hemoglobin: 12.6 g/dL — ABNORMAL LOW (ref 13.0–17.0)
MCH: 30.4 pg (ref 26.0–34.0)
MCHC: 31.9 g/dL (ref 30.0–36.0)
MCV: 95.2 fL (ref 80.0–100.0)
Platelets: 272 10*3/uL (ref 150–400)
RBC: 4.15 MIL/uL — ABNORMAL LOW (ref 4.22–5.81)
RDW: 13 % (ref 11.5–15.5)
WBC: 8.4 10*3/uL (ref 4.0–10.5)
nRBC: 0 % (ref 0.0–0.2)

## 2019-06-14 LAB — SURGICAL PCR SCREEN
MRSA, PCR: NEGATIVE
Staphylococcus aureus: NEGATIVE

## 2019-06-15 NOTE — Progress Notes (Signed)
Anesthesia Chart Review   Case: [id number] Date/Time: 06/20/19 1310   Procedure: TOTAL SHOULDER ARTHROPLASTY (Left Shoulder)   Anesthesia type: Choice   Pre-op diagnosis: OA LEFT SHOULDER   Location: [facility] ROOM 06 / [facility]   Surgeons: Hiram Gash, MD      DISCUSSION:86 y.o. former smoker (70 pack years, quit 04/23/18) with h/o GERD, PAF, mild aortic valve stenosis, left shoulder OA scheduled for above procedure 06/20/2019 with Dr. Ophelia Charter.    Pt last seen by cardiologist, Dr. Drue Dun, 06/08/2019.  Per OV note, "Preop evaluation: From cardiac point of view he is stable to proceed with surgery as scheduled.  His Eliquis need to be withdrawn 2 days before procedure.  To be restarted as quickly as possible preoperatively thereafter."  Anticipate pt can proceed with planned procedure barring acute status change.   VS: BP (!) 111/55   Pulse (!) 56   Temp 36.4 C (Oral)   Resp 18   Ht 6\' 1"  (1.854 m)   Wt 82.2 kg   SpO2 95%   BMI 23.91 kg/m   PROVIDERS: [name], MD is PCP   [name], MD is Cardiologist  LABS: Labs reviewed: Acceptable for surgery. (all labs ordered are listed, but only abnormal results are displayed)  Labs Reviewed  CBC - Abnormal; Notable for the following components:      Result Value   RBC 4.15 (*)    Hemoglobin 12.6 (*)    All other components within normal limits  SURGICAL PCR SCREEN  BASIC METABOLIC PANEL     IMAGES:   EKG: 01/23/2019 Rate 59 bpm Sinus bradycardia   CV: Echo 03/07/2019 IMPRESSIONS    1. Left ventricular ejection fraction, by visual estimation, is 60 to  65%. The left ventricle has normal function. Left ventricular septal wall  thickness was mildly increased. Mildly increased left ventricular  posterior wall thickness. There is mildly  increased left ventricular hypertrophy.  2. Left ventricular diastolic parameters are consistent with Grade I  diastolic dysfunction (impaired relaxation).  3. The left  ventricle has no regional wall motion abnormalities.  4. Global right ventricle has normal systolic function.The right  ventricular size is mildly enlarged. Mildly increased right ventricular  wall thickness.  5. Left atrial size was normal.  6. Right atrial size was normal.  7. Mild mitral annular calcification.  8. The mitral valve is normal in structure. Mild mitral valve  regurgitation. No evidence of mitral stenosis.  9. The tricuspid valve is normal in structure.  10. The aortic valve is tricuspid. Aortic valve regurgitation is mild.  Mild aortic valve stenosis.  11. The pulmonic valve was normal in structure. Pulmonic valve  regurgitation is not visualized.  12. There is mild dilatation of the ascending aorta.  13. Mildly elevated pulmonary artery systolic pressure.  14. The inferior vena cava is dilated in size with >50% respiratory  variability, suggesting right atrial pressure of 8 mmHg.   Myocardial Perfusion 02/01/2019  The left ventricular ejection fraction is normal (55-65%).  Nuclear stress EF: 62%.  There was no ST segment deviation noted during stress.  The study is normal.  This is a low risk study. Past Medical History:  Diagnosis Date  . Acute non-recurrent pansinusitis 04/18/2019  . 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 Adult situational stress disorder   . Anxiety   . Bladder cancer ([place])   . Chest pain in adult 08/23/2016   Stress echo test done 09/05/15 Normal at 7 mets      .  Chronic anticoagulation 11/15/2016  . Chronic anxiety 09/09/2016  . Chronic insomnia 09/09/2016  . Chronic pain of both knees 10/06/2017  . Chronic pain of both shoulders 09/14/2018  . Constipation 10/02/2018  . Costochondral chest pain 09/07/2016  . Cough with hemoptysis 01/06/2017  . Depression   . Dysuria 01/06/2017  . Emphysema lung ([facility])    Mild  . Esophagitis   . GERD (gastroesophageal reflux disease)   . GERD without esophagitis 09/09/2016  . High risk medication use  08/23/2016   Flecanide  . Hypotension 03/10/2018  . Low back pain 11/06/2018  . Lumbar herniated disc   . Lung nodule < 6cm on CT 09/30/2017  . Malaise and fatigue 09/30/2017  . Medicare annual wellness visit, subsequent 09/30/2017  . Nonsustained ventricular tachycardia ([place])   . Orchitis 11/06/2018  . PAF (paroxysmal atrial fibrillation) ([place]) 08/23/2016   CHADS2 vasc=2, he has elected not to accept anticoagulation  . Penile bleeding 10/02/2018  . Penile discharge 11/06/2018  . Peripheral edema 11/06/2018  . 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 Productive cough 11/06/2018  . Seasonal allergies   . Trochanteric bursitis of left hip 10/06/2017  . Unsteady gait 04/03/2018  . Ventricular tachycardia ([place]) 03/06/2019  . Vertigo   . Vitamin D deficiency 09/30/2017    Past Surgical History:  Procedure Laterality Date  . BLADDER SURGERY    . COLONOSCOPY    . VEIN SURGERY Left     MEDICATIONS: . acetaminophen (TYLENOL 8 HOUR) 650 MG CR tablet  . apixaban (ELIQUIS) 5 MG TABS tablet  . Cholecalciferol (VITAMIN D-3) 125 MCG (5000 UT) TABS  . CRANBERRY PO  . Dextromethorphan-guaiFENesin ([place] DM PO)  . fluticasone (FLONASE) 50 MCG/ACT nasal spray  . magnesium oxide (MAG-OX) 400 MG tablet  . Multiple Vitamin (MULTIVITAMIN) tablet  . Multiple Vitamins-Minerals (HAIR SKIN AND NAILS FORMULA) TABS  . Multiple Vitamins-Minerals (ICAPS AREDS 2 PO)  . nitroGLYCERIN (NITROSTAT) 0.4 MG SL tablet  . olopatadine (PATADAY) 0.1 % ophthalmic solution  . omeprazole (PRILOSEC) 20 MG capsule  . OVER THE COUNTER MEDICATION  . Probiotic Product (PROBIOTIC DAILY) CAPS  . traMADol (ULTRAM) 50 MG tablet  . traZODone (DESYREL) 150 MG tablet   No current facility-administered medications for this encounter.     Maia Plan [facility] Pre-Surgical Testing [phone] 06/15/19  1:42 PM

## 2019-06-16 ENCOUNTER — Other Ambulatory Visit (HOSPITAL_COMMUNITY)
Admission: RE | Admit: 2019-06-16 | Discharge: 2019-06-16 | Disposition: A | Discharge status: Home / Self Care | Payer: Medicare HMO | Source: Ambulatory Visit | Attending: Orthopaedic Surgery | Admitting: Orthopaedic Surgery

## 2019-06-16 DIAGNOSIS — Z01812 Encounter for preprocedural laboratory examination: Secondary | ICD-10-CM | POA: Diagnosis present

## 2019-06-16 DIAGNOSIS — Z20822 Contact with and (suspected) exposure to covid-19: Secondary | ICD-10-CM | POA: Insufficient documentation

## 2019-06-16 LAB — SARS CORONAVIRUS 2 (TAT 6-24 HRS): SARS Coronavirus 2: NEGATIVE

## 2019-06-18 ENCOUNTER — Ambulatory Visit: Payer: Medicare HMO | Admitting: Cardiology

## 2019-06-19 NOTE — H&P (Signed)
PREOPERATIVE H&P  Chief Complaint: OA LEFT SHOULDER  HPI: Wayne Mcguire is a 86 y.o. male who is scheduled for TOTAL SHOULDER ARTHROPLASTY versus REVERSE TOTAL SHOULDER.   Patient has a past medical history significant for paroxysmal atrial fibrillation, GERD, and mild aortic valve stenosis.  This is an 86 year old male who has had chronic shoulder pain in the left shoulder for many years.  No injury.  He has tried injections, medications and nonoperative measures and has not made much progress with this.  He is having significant limitations and is unhappy with his function.  His motion has been limited.    His symptoms are rated as moderate to severe, and have been worsening.  This is significantly impairing activities of daily living.    Please see clinic note for further details on this patient's care.    He has elected for surgical management.   Past Medical History:  Diagnosis Date  . Acute non-recurrent pansinusitis 04/18/2019  . 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 Adult situational stress disorder   . Anxiety   . Bladder cancer ([place])   . Chest pain in adult 08/23/2016   Stress echo test done 09/05/15 Normal at 7 mets      . Chronic anticoagulation 11/15/2016  . Chronic anxiety 09/09/2016  . Chronic insomnia 09/09/2016  . Chronic pain of both knees 10/06/2017  . Chronic pain of both shoulders 09/14/2018  . Constipation 10/02/2018  . Costochondral chest pain 09/07/2016  . Cough with hemoptysis 01/06/2017  . Depression   . Dysuria 01/06/2017  . Emphysema lung ([facility])    Mild  . Esophagitis   . GERD (gastroesophageal reflux disease)   . GERD without esophagitis 09/09/2016  . High risk medication use 08/23/2016   Flecanide  . Hypotension 03/10/2018  . Low back pain 11/06/2018  . Lumbar herniated disc   . Lung nodule < 6cm on CT 09/30/2017  . Malaise and fatigue 09/30/2017  . Medicare annual wellness visit, subsequent 09/30/2017  . Nonsustained ventricular tachycardia ([place])   . Orchitis  11/06/2018  . PAF (paroxysmal atrial fibrillation) ([place]) 08/23/2016   CHADS2 vasc=2, he has elected not to accept anticoagulation  . Penile bleeding 10/02/2018  . Penile discharge 11/06/2018  . Peripheral edema 11/06/2018  . 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 Productive cough 11/06/2018  . Seasonal allergies   . Trochanteric bursitis of left hip 10/06/2017  . Unsteady gait 04/03/2018  . Ventricular tachycardia ([place]) 03/06/2019  . Vertigo   . Vitamin D deficiency 09/30/2017   Past Surgical History:  Procedure Laterality Date  . BLADDER SURGERY    . COLONOSCOPY    . VEIN SURGERY Left    Social History   Socioeconomic History  . Marital status: Married    Spouse name: Not on file  . Number of children: Not on file  . Years of education: Not on file  . Highest education level: Not on file  Occupational History  . Not on file  Tobacco Use  . Smoking status: Former Smoker    Packs/day: 1.00    Years: 70.00    Pack years: 70.00    Types: Cigarettes    Quit date: 04/2018    Years since quitting: 1.1  . Smokeless tobacco: Never Used  Substance and Sexual Activity  . Alcohol use: No  . Drug use: No  . Sexual activity: Not on file  Other Topics Concern  . Not on file  Social History Narrative  .  Not on file   Social Determinants of Health   Financial Resource Strain:   . Difficulty of Paying Living Expenses:   Food Insecurity:   . Worried About Charity fundraiser in the Last Year:   . Arboriculturist in the Last Year:   Transportation Needs:   . Film/video editor (Medical):   Marland Kitchen Lack of Transportation (Non-Medical):   Physical Activity:   . Days of Exercise per Week:   . Minutes of Exercise per Session:   Stress:   . Feeling of Stress :   Social Connections:   . Frequency of Communication with Friends and Family:   . Frequency of Social Gatherings with Friends and Family:   . Attends Religious Services:   . Active Member of Clubs or  Organizations:   . Attends Archivist Meetings:   Marland Kitchen Marital Status:    Family History  Problem Relation Age of Onset  . Heart attack Brother   . Heart failure Father    Allergies  Allergen Reactions  . Terbinafine And Related Other (See Comments)    Dizziness and blurry vision, may cause irregular heart beat.   Prior to Admission medications   Medication Sig Start Date End Date Taking? Authorizing Provider  acetaminophen (TYLENOL 8 HOUR) 650 MG CR tablet Take 650 mg by mouth 2 (two) times daily.   Yes [provider]  Cholecalciferol (VITAMIN D-3) 125 MCG (5000 UT) TABS Take 1 tablet by mouth daily.   Yes [provider]  CRANBERRY PO Take 1 tablet by mouth daily.   Yes [provider]  Dextromethorphan-guaiFENesin (MUCINEX DM PO) Take 1 tablet by mouth 2 (two) times daily.   Yes [provider]  fluticasone (FLONASE) 50 MCG/ACT nasal spray Place 1 spray into both nostrils daily.    Yes [provider]  magnesium oxide (MAG-OX) 400 MG tablet Take 400 mg by mouth daily.   Yes [provider]  Multiple Vitamin (MULTIVITAMIN) tablet Take 1 tablet by mouth daily.   Yes [provider]  Multiple Vitamins-Minerals (HAIR SKIN AND NAILS FORMULA) TABS Take 1 tablet by mouth daily.   Yes [provider]  Multiple Vitamins-Minerals (ICAPS AREDS 2 PO) Take 2 capsules by mouth daily.    Yes [provider]  nitroGLYCERIN (NITROSTAT) 0.4 MG SL tablet Place 1 tablet (0.4 mg total) under the tongue every 5 (five) minutes as needed for chest pain. 03/10/18  Yes Richardo Priest, MD  olopatadine (PATADAY) 0.1 % ophthalmic solution Place 1 drop into both eyes daily as needed for allergies.   Yes [provider]  OVER THE COUNTER MEDICATION Take 1 capsule by mouth in the morning and at bedtime. IB Gard   Yes [provider]  traMADol (ULTRAM) 50 MG tablet Take 50 mg by mouth as needed.    Yes [provider]  traZODone (DESYREL) 150 MG tablet Take 150 mg by mouth at bedtime.  03/08/16  Yes [provider]  apixaban (ELIQUIS) 5 MG TABS tablet Take 1 tablet (5 mg total) by mouth 2 (two) times daily. 06/08/19   Richardo Priest, MD  omeprazole (PRILOSEC) 20 MG capsule Take 20 mg by mouth daily.    [provider]  Probiotic Product (PROBIOTIC DAILY) CAPS Take 1 capsule by mouth daily.    [provider]    ROS: All other systems have been reviewed and were otherwise negative with the exception of those mentioned in  the HPI and as above.  Physical Exam: General: Alert, no acute distress Cardiovascular: No pedal edema Respiratory: No cyanosis, no use of accessory musculature GI: No organomegaly, abdomen is soft and non-tender Skin: No lesions in the area of chief complaint Neurologic: Sensation intact distally Psychiatric: Patient is competent for consent with normal mood and affect Lymphatic: No axillary or cervical lymphadenopathy  MUSCULOSKELETAL:  Left shoulder: 90 degrees of active forward elevation, passive to about 100, external rotation to 30. Internal rotation to the back pocket and symmetric.  Cuff strength is 5/5.    Imaging: X-rays are reviewed of the shoulder, three views, which demonstrate end stage bone-on-bone glenohumeral arthritis with no other obvious pathology.  He does have posterior erosion consistent with likely B2 glenoid.    Assessment: OA LEFT SHOULDER  Plan: Plan for Procedure(s): TOTAL SHOULDER ARTHROPLASTY VERSUS REVERSE TOTAL SHOULDER  We think the patient would benefit from surgical management at this point as he has failed nonoperative measures.  We talked about risks, benefits and alternatives of anatomic versus reverse total shoulder arthroplasty.    The risks benefits and alternatives were discussed with the patient including but not limited to the risks of nonoperative treatment, versus surgical intervention including  infection, bleeding, nerve injury,  blood clots, cardiopulmonary complications, morbidity, mortality, among others, and they were willing to proceed.   We additionally specifically discussed risks of axillary nerve injury, infection, periprosthetic fracture, continued pain and longevity of implants prior to beginning procedure.    Patient will be placed in observation after surgery for medical stabilization after surgery and monitoring of post-op pain control. He will be placed in observation due to his multiple co-morbidities and his age. His age, as well as his ability to walk means he is potentially at high risk for falls.  He may benefit from home health services after a one-night stay in the hospital.    The patient acknowledged the explanation, agreed to proceed with the plan and consent was signed.   Patient has been cleared for surgery from his PCP, Dr. Unk Lightning. He also received clearance from Dr. Desiree Hane, his cardiologist. He will hold Eliquis 2 days before surgery and restart the morning after.   Operative Plan: Left total shoulder arthroplasty versus reverse total shoulder Discharge Medications: Tylenol, Oxycodone, Zofran DVT Prophylaxis: On Eliquis Physical Therapy: Home health PT versus Outpatient PT Special Discharge needs: [place], PA-C  06/19/2019 4:12 PM

## 2019-06-20 ENCOUNTER — Other Ambulatory Visit: Payer: Self-pay

## 2019-06-20 ENCOUNTER — Ambulatory Visit (HOSPITAL_COMMUNITY): Payer: Medicare HMO | Admitting: Anesthesiology

## 2019-06-20 ENCOUNTER — Telehealth (HOSPITAL_COMMUNITY): Payer: Self-pay | Admitting: *Deleted

## 2019-06-20 ENCOUNTER — Ambulatory Visit (HOSPITAL_COMMUNITY): Payer: Medicare HMO

## 2019-06-20 ENCOUNTER — Ambulatory Visit (HOSPITAL_COMMUNITY)
Admission: RE | Admit: 2019-06-20 | Discharge: 2019-06-20 | Disposition: A | Discharge status: Home / Self Care | Payer: Medicare HMO | Attending: Orthopaedic Surgery | Admitting: Orthopaedic Surgery

## 2019-06-20 ENCOUNTER — Encounter (HOSPITAL_COMMUNITY)
Admission: RE | Disposition: A | Discharge status: Home / Self Care | Payer: Self-pay | Source: Home / Self Care | Attending: Orthopaedic Surgery

## 2019-06-20 ENCOUNTER — Ambulatory Visit (HOSPITAL_COMMUNITY): Payer: Medicare HMO | Admitting: Physician Assistant

## 2019-06-20 ENCOUNTER — Encounter (HOSPITAL_COMMUNITY): Payer: Self-pay | Admitting: Orthopaedic Surgery

## 2019-06-20 DIAGNOSIS — I472 Ventricular tachycardia: Secondary | ICD-10-CM | POA: Diagnosis not present

## 2019-06-20 DIAGNOSIS — Z888 Allergy status to other drugs, medicaments and biological substances status: Secondary | ICD-10-CM | POA: Insufficient documentation

## 2019-06-20 DIAGNOSIS — Z87891 Personal history of nicotine dependence: Secondary | ICD-10-CM | POA: Insufficient documentation

## 2019-06-20 DIAGNOSIS — Z7901 Long term (current) use of anticoagulants: Secondary | ICD-10-CM | POA: Diagnosis not present

## 2019-06-20 DIAGNOSIS — I350 Nonrheumatic aortic (valve) stenosis: Secondary | ICD-10-CM | POA: Insufficient documentation

## 2019-06-20 DIAGNOSIS — F329 Major depressive disorder, single episode, unspecified: Secondary | ICD-10-CM | POA: Diagnosis not present

## 2019-06-20 DIAGNOSIS — F419 Anxiety disorder, unspecified: Secondary | ICD-10-CM | POA: Insufficient documentation

## 2019-06-20 DIAGNOSIS — M85812 Other specified disorders of bone density and structure, left shoulder: Secondary | ICD-10-CM | POA: Diagnosis not present

## 2019-06-20 DIAGNOSIS — M19012 Primary osteoarthritis, left shoulder: Secondary | ICD-10-CM | POA: Diagnosis present

## 2019-06-20 DIAGNOSIS — Z79899 Other long term (current) drug therapy: Secondary | ICD-10-CM | POA: Diagnosis not present

## 2019-06-20 DIAGNOSIS — I480 Paroxysmal atrial fibrillation: Secondary | ICD-10-CM | POA: Diagnosis not present

## 2019-06-20 DIAGNOSIS — K219 Gastro-esophageal reflux disease without esophagitis: Secondary | ICD-10-CM | POA: Insufficient documentation

## 2019-06-20 DIAGNOSIS — J439 Emphysema, unspecified: Secondary | ICD-10-CM | POA: Diagnosis not present

## 2019-06-20 DIAGNOSIS — Z09 Encounter for follow-up examination after completed treatment for conditions other than malignant neoplasm: Secondary | ICD-10-CM

## 2019-06-20 HISTORY — DX: Primary osteoarthritis, left shoulder: M19.012

## 2019-06-20 HISTORY — PX: TOTAL SHOULDER ARTHROPLASTY: SHX126

## 2019-06-20 SURGERY — ARTHROPLASTY, SHOULDER, TOTAL
Anesthesia: Regional | Site: Shoulder | Laterality: Left

## 2019-06-20 MED ORDER — PROPOFOL 10 MG/ML IV BOLUS
INTRAVENOUS | Status: DC | PRN
  Administered 2019-06-20: 100 mg via INTRAVENOUS

## 2019-06-20 MED ORDER — VANCOMYCIN HCL 1000 MG IV SOLR
INTRAVENOUS | Status: AC
  Filled 2019-06-20: qty 1000

## 2019-06-20 MED ORDER — FENTANYL CITRATE (PF) 100 MCG/2ML IJ SOLN
INTRAMUSCULAR | Status: DC | PRN
  Administered 2019-06-20 (×6): 50 ug via INTRAVENOUS

## 2019-06-20 MED ORDER — DEXAMETHASONE SODIUM PHOSPHATE 10 MG/ML IJ SOLN
INTRAMUSCULAR | Status: DC | PRN
  Administered 2019-06-20: 5 mg via INTRAVENOUS

## 2019-06-20 MED ORDER — BUPIVACAINE HCL (PF) 0.5 % IJ SOLN
INTRAMUSCULAR | Status: DC | PRN
  Administered 2019-06-20: 15 mL via PERINEURAL

## 2019-06-20 MED ORDER — TRANEXAMIC ACID-NACL 1000-0.7 MG/100ML-% IV SOLN
1000.0000 mg | INTRAVENOUS | Status: AC
  Administered 2019-06-20: 1000 mg via INTRAVENOUS
  Filled 2019-06-20: qty 100

## 2019-06-20 MED ORDER — CEFAZOLIN SODIUM-DEXTROSE 2-4 GM/100ML-% IV SOLN
2.0000 g | INTRAVENOUS | Status: AC
  Administered 2019-06-20: 2 g via INTRAVENOUS
  Filled 2019-06-20: qty 100

## 2019-06-20 MED ORDER — MIDAZOLAM HCL 2 MG/2ML IJ SOLN
1.0000 mg | INTRAMUSCULAR | Status: DC
  Filled 2019-06-20: qty 2

## 2019-06-20 MED ORDER — FENTANYL CITRATE (PF) 100 MCG/2ML IJ SOLN
INTRAMUSCULAR | Status: AC
  Filled 2019-06-20: qty 2

## 2019-06-20 MED ORDER — SUGAMMADEX SODIUM 200 MG/2ML IV SOLN
INTRAVENOUS | Status: DC | PRN
  Administered 2019-06-20: 200 mg via INTRAVENOUS

## 2019-06-20 MED ORDER — BUPIVACAINE LIPOSOME 1.3 % IJ SUSP
INTRAMUSCULAR | Status: DC | PRN
  Administered 2019-06-20: 10 mL via PERINEURAL

## 2019-06-20 MED ORDER — FENTANYL CITRATE (PF) 100 MCG/2ML IJ SOLN
50.0000 ug | INTRAMUSCULAR | Status: DC
  Administered 2019-06-20: 12:00:00 100 ug via INTRAVENOUS
  Filled 2019-06-20: qty 2

## 2019-06-20 MED ORDER — 0.9 % SODIUM CHLORIDE (POUR BTL) OPTIME
TOPICAL | Status: DC | PRN
  Administered 2019-06-20: 1000 mL

## 2019-06-20 MED ORDER — DEXMEDETOMIDINE HCL 200 MCG/2ML IV SOLN
INTRAVENOUS | Status: DC | PRN
  Administered 2019-06-20: 12 ug via INTRAVENOUS
  Administered 2019-06-20: 8 ug via INTRAVENOUS

## 2019-06-20 MED ORDER — ONDANSETRON HCL 4 MG/2ML IJ SOLN
INTRAMUSCULAR | Status: DC | PRN
  Administered 2019-06-20: 4 mg via INTRAVENOUS

## 2019-06-20 MED ORDER — VANCOMYCIN HCL 1 G IV SOLR
INTRAVENOUS | Status: DC | PRN
  Administered 2019-06-20: 1000 mg via TOPICAL

## 2019-06-20 MED ORDER — PROPOFOL 10 MG/ML IV BOLUS
INTRAVENOUS | Status: AC
  Filled 2019-06-20: qty 20

## 2019-06-20 MED ORDER — FENTANYL CITRATE (PF) 250 MCG/5ML IJ SOLN
INTRAMUSCULAR | Status: AC
  Filled 2019-06-20: qty 5

## 2019-06-20 MED ORDER — EPHEDRINE SULFATE-NACL 50-0.9 MG/10ML-% IV SOSY
PREFILLED_SYRINGE | INTRAVENOUS | Status: DC | PRN
  Administered 2019-06-20: 10 mg via INTRAVENOUS

## 2019-06-20 MED ORDER — FENTANYL CITRATE (PF) 100 MCG/2ML IJ SOLN: 25.0000 ug | INTRAMUSCULAR | Status: DC | PRN

## 2019-06-20 MED ORDER — PHENYLEPHRINE HCL-NACL 10-0.9 MG/250ML-% IV SOLN
INTRAVENOUS | Status: DC | PRN
  Administered 2019-06-20: 50 ug/min via INTRAVENOUS

## 2019-06-20 MED ORDER — SUCCINYLCHOLINE CHLORIDE 20 MG/ML IJ SOLN
INTRAMUSCULAR | Status: DC | PRN
  Administered 2019-06-20: 160 mg via INTRAVENOUS

## 2019-06-20 MED ORDER — ROCURONIUM BROMIDE 10 MG/ML (PF) SYRINGE
PREFILLED_SYRINGE | INTRAVENOUS | Status: DC | PRN
  Administered 2019-06-20 (×2): 10 mg via INTRAVENOUS
  Administered 2019-06-20: 40 mg via INTRAVENOUS

## 2019-06-20 MED ORDER — LACTATED RINGERS IV SOLN: INTRAVENOUS | Status: DC

## 2019-06-20 MED ORDER — ACETAMINOPHEN 500 MG PO TABS: 1000.0000 mg | ORAL_TABLET | Freq: Three times a day (TID) | ORAL | 0 refills | Status: AC

## 2019-06-20 MED ORDER — ACETAMINOPHEN 500 MG PO TABS
1000.0000 mg | ORAL_TABLET | Freq: Once | ORAL | Status: AC
  Administered 2019-06-20: 1000 mg via ORAL
  Filled 2019-06-20: qty 2

## 2019-06-20 MED ORDER — SODIUM CHLORIDE 0.9 % IR SOLN
Status: DC | PRN
  Administered 2019-06-20: 3000 mL

## 2019-06-20 MED ORDER — ONDANSETRON HCL 4 MG PO TABS: 4.0000 mg | ORAL_TABLET | Freq: Three times a day (TID) | ORAL | 1 refills | Status: AC | PRN

## 2019-06-20 MED ORDER — OXYCODONE HCL 5 MG PO TABS: ORAL_TABLET | ORAL | 0 refills | Status: AC

## 2019-06-20 SURGICAL SUPPLY — 68 items
BASEPLATE GLENOSPHERE 25 STD (Miscellaneous) ×2 IMPLANT
BIT DRILL 3.2 PERIPHERAL SCREW (BIT) ×2 IMPLANT
BLADE SAW SAG 73X25 THK (BLADE) ×1
BLADE SAW SGTL 73X25 THK (BLADE) ×1 IMPLANT
CHLORAPREP W/TINT 26 (MISCELLANEOUS) ×4 IMPLANT
CLSR STERI-STRIP ANTIMIC 1/2X4 (GAUZE/BANDAGES/DRESSINGS) ×2 IMPLANT
COOLER ICEMAN CLASSIC (MISCELLANEOUS) ×2 IMPLANT
COVER BACK TABLE 60X90IN (DRAPES) ×2 IMPLANT
COVER SURGICAL LIGHT HANDLE (MISCELLANEOUS) ×2 IMPLANT
COVER WAND RF STERILE (DRAPES) ×2 IMPLANT
DRAPE C-ARM 42X120 X-RAY (DRAPES) IMPLANT
DRAPE INCISE IOBAN 66X45 STRL (DRAPES) ×4 IMPLANT
DRAPE ORTHO SPLIT 77X108 STRL (DRAPES) ×4
DRAPE SHEET LG 3/4 BI-LAMINATE (DRAPES) ×4 IMPLANT
DRAPE SURG ORHT 6 SPLT 77X108 (DRAPES) ×2 IMPLANT
DRESSING AQUACEL AG SP 3.5X6 (GAUZE/BANDAGES/DRESSINGS) ×1 IMPLANT
DRSG AQUACEL AG ADV 3.5X 6 (GAUZE/BANDAGES/DRESSINGS) ×2 IMPLANT
DRSG AQUACEL AG SP 3.5X6 (GAUZE/BANDAGES/DRESSINGS) ×2
ELECT BLADE TIP CTD 4 INCH (ELECTRODE) ×2 IMPLANT
ELECT REM PT RETURN 15FT ADLT (MISCELLANEOUS) ×2 IMPLANT
GLENOSPHERE STANDARD 39 (Joint) ×2 IMPLANT
GLENOSPHERE STD 39 (Joint) ×1 IMPLANT
GLOVE BIO SURGEON STRL SZ7 (GLOVE) ×4 IMPLANT
GLOVE BIOGEL PI IND STRL 7.0 (GLOVE) ×1 IMPLANT
GLOVE BIOGEL PI IND STRL 8 (GLOVE) ×1 IMPLANT
GLOVE BIOGEL PI INDICATOR 7.0 (GLOVE) ×1
GLOVE BIOGEL PI INDICATOR 8 (GLOVE) ×1
GLOVE ECLIPSE 8.0 STRL XLNG CF (GLOVE) ×4 IMPLANT
GOWN STRL REUS W/TWL LRG LVL3 (GOWN DISPOSABLE) ×4 IMPLANT
GUIDEWIRE GLENOID 2.5X220 (WIRE) ×2 IMPLANT
HANDPIECE INTERPULSE COAX TIP (DISPOSABLE) ×2
IMPL REVERSE SHOULDER 0X3.5 (Shoulder) ×1 IMPLANT
IMPLANT REVERSE SHOULDER 0X3.5 (Shoulder) ×2 IMPLANT
INSERT REV KIT SHOULDER 6X39 (Screw) ×2 IMPLANT
KIT BASIN (CUSTOM PROCEDURE TRAY) ×2 IMPLANT
KIT STABILIZATION SHOULDER (MISCELLANEOUS) ×2 IMPLANT
KIT TURNOVER KIT A (KITS) ×2 IMPLANT
MANIFOLD NEPTUNE II (INSTRUMENTS) ×2 IMPLANT
NEEDLE HYPO 25X1 1.5 SAFETY (NEEDLE) IMPLANT
NEEDLE MAYO CATGUT SZ4 (NEEDLE) ×2 IMPLANT
NS IRRIG 1000ML POUR BTL (IV SOLUTION) ×2 IMPLANT
PACK SHOULDER (CUSTOM PROCEDURE TRAY) ×2 IMPLANT
PAD COLD SHLDR WRAP-ON (PAD) ×2 IMPLANT
RESTRAINT HEAD UNIVERSAL NS (MISCELLANEOUS) ×2 IMPLANT
SCREW 5.0X38 SMALL F/PERFORM (Screw) ×2 IMPLANT
SCREW BONE THREAD 6.5X35 (Screw) ×2 IMPLANT
SCREW PERIPHERAL 5.0X34 (Screw) ×2 IMPLANT
SET HNDPC FAN SPRY TIP SCT (DISPOSABLE) ×1 IMPLANT
SLING ULTRA III MED (ORTHOPEDIC SUPPLIES) ×2 IMPLANT
SMARTMIX MINI TOWER (MISCELLANEOUS) ×2
SPONGE LAP 18X18 RF (DISPOSABLE) IMPLANT
STEM HUMERAL AEQUALIS 5BX82 (Stem) ×2 IMPLANT
SUCTION FRAZIER HANDLE 12FR (TUBING) ×2
SUCTION TUBE FRAZIER 12FR DISP (TUBING) ×1 IMPLANT
SUT ETHIBOND 2 V 37 (SUTURE) ×2 IMPLANT
SUT ETHIBOND NAB CT1 #1 30IN (SUTURE) ×2 IMPLANT
SUT FIBERWIRE #5 38 CONV NDL (SUTURE)
SUT MNCRL AB 4-0 PS2 18 (SUTURE) ×2 IMPLANT
SUT VIC AB 0 CT1 36 (SUTURE) ×2 IMPLANT
SUT VIC AB 3-0 CT1 27 (SUTURE) ×2
SUT VIC AB 3-0 CT1 TAPERPNT 27 (SUTURE) ×1 IMPLANT
SUT VIC AB 3-0 SH 27 (SUTURE)
SUT VIC AB 3-0 SH 27X BRD (SUTURE) IMPLANT
SUTURE FIBERWR #5 38 CONV NDL (SUTURE) IMPLANT
TOWEL OR 17X26 10 PK STRL BLUE (TOWEL DISPOSABLE) ×2 IMPLANT
TOWER CARTRIDGE SMART MIX (DISPOSABLE) IMPLANT
TOWER SMARTMIX MINI (MISCELLANEOUS) ×1 IMPLANT
WATER STERILE IRR 1000ML POUR (IV SOLUTION) ×2 IMPLANT

## 2019-06-20 NOTE — Transfer of Care (Signed)
Immediate Anesthesia Transfer of Care Note  Patient: Wayne Mcguire  Procedure(s) Performed: TOTAL SHOULDER ARTHROPLASTY (Left Shoulder)  Patient Location: PACU  Anesthesia Type:General  Level of Consciousness: sedated, drowsy and responds to stimulation  Airway & Oxygen Therapy: Patient Spontanous Breathing and Patient connected to face mask oxygen  Post-op Assessment: Report given to RN and Post -op Vital signs reviewed and stable  Post vital signs: Reviewed and stable  Last Vitals:  Vitals Value Taken Time  BP 103/54 06/20/19 1501  Temp    Pulse 66 06/20/19 1507  Resp 14 06/20/19 1507  SpO2 100 % 06/20/19 1507  Vitals shown include unvalidated device data.  Last Pain:  Vitals:   06/20/19 1213  TempSrc:   PainSc: 0-No pain         Complications: No apparent anesthesia complications

## 2019-06-20 NOTE — Anesthesia Postprocedure Evaluation (Signed)
Anesthesia Post Note  Patient: Wayne Mcguire  Procedure(s) Performed: TOTAL SHOULDER ARTHROPLASTY (Left Shoulder)     Patient location during evaluation: PACU Anesthesia Type: Regional Level of consciousness: awake Pain management: pain level controlled Vital Signs Assessment: post-procedure vital signs reviewed and stable Respiratory status: spontaneous breathing Cardiovascular status: stable Postop Assessment: no apparent nausea or vomiting Anesthetic complications: no    Last Vitals:  Vitals:   06/20/19 1615 06/20/19 1630  BP: 95/63 (!) 95/56  Pulse: (!) 56 (!) 58  Resp: 11 10  Temp:    SpO2: 96% 96%    Last Pain:  Vitals:   06/20/19 1630  TempSrc:   PainSc: 0-No pain                 Nathaniel Wakeley

## 2019-06-20 NOTE — Progress Notes (Signed)
PA at bedside.  Plan to d/c home

## 2019-06-20 NOTE — Anesthesia Preprocedure Evaluation (Addendum)
Anesthesia Evaluation  Patient identified by MRN, date of birth, ID band Patient awake    Reviewed: Allergy & Precautions, NPO status , Patient's Chart, lab work & pertinent test results  Airway Mallampati: II  TM Distance: >3 FB     Dental   Pulmonary COPD, former smoker,    breath sounds clear to auscultation       Cardiovascular + dysrhythmias Atrial Fibrillation and Ventricular Tachycardia  Rhythm:Regular Rate:Normal  TTE 02/2019 EF 60-65%, G1DD, mild MR, mild AI, mild AS  Stress Test 2020 The left ventricular ejection fraction is normal (55-65%). Nuclear stress EF: 62%. There was no ST segment deviation noted during stress. The study is normal. This is a low risk study.     Neuro/Psych PSYCHIATRIC DISORDERS Anxiety Depression negative neurological ROS     GI/Hepatic Neg liver ROS, GERD  ,  Endo/Other  negative endocrine ROS  Renal/GU negative Renal ROS  negative genitourinary   Musculoskeletal negative musculoskeletal ROS (+)   Abdominal   Peds  Hematology negative hematology ROS (+)   Anesthesia Other Findings   Reproductive/Obstetrics                            Anesthesia Physical Anesthesia Plan  ASA: III  Anesthesia Plan: General and Regional   Post-op Pain Management:  Regional for Post-op pain   Induction: Intravenous  PONV Risk Score and Plan: 2 and Dexamethasone, Ondansetron and Treatment may vary due to age or medical condition  Airway Management Planned: Oral ETT  Additional Equipment:   Intra-op Plan:   Post-operative Plan: Extubation in OR  Informed Consent: I have reviewed the patients History and Physical, chart, labs and discussed the procedure including the risks, benefits and alternatives for the proposed anesthesia with the patient or authorized representative who has indicated his/her understanding and acceptance.     Dental advisory  given  Plan Discussed with: CRNA and Anesthesiologist  Anesthesia Plan Comments:        Anesthesia Quick Evaluation

## 2019-06-20 NOTE — Anesthesia Procedure Notes (Signed)
Anesthesia Regional Block: Interscalene brachial plexus block   Pre-Anesthetic Checklist: ,, timeout performed, Correct Patient, Correct Site, Correct Laterality, Correct Procedure, Correct Position, site marked, Risks and benefits discussed,  Surgical consent,  Pre-op evaluation,  At surgeon's request and post-op pain management  Laterality: Left  Prep: Maximum Sterile Barrier Precautions used, chloraprep       Needles:  Injection technique: Single-shot  Needle Type: Echogenic Stimulator Needle     Needle Length: 4cm  Needle Gauge: 22     Additional Needles:   Procedures:,,,, ultrasound used (permanent image in chart),,,,  Narrative:  Start time: 06/20/2019 12:04 PM End time: 06/20/2019 12:14 PM Injection made incrementally with aspirations every 5 mL.  Performed by: Personally  Anesthesiologist: Freddrick March, MD  Additional Notes: Monitors applied. No increased pain on injection. No increased resistance to injection. Injection made in 5cc increments. Good needle visualization. Patient tolerated procedure well.

## 2019-06-20 NOTE — Progress Notes (Signed)
AssistedDr. Chelsey Woodrum with left, ultrasound guided, interscalene  block. Side rails up, monitors on throughout procedure. See vital signs in flow sheet. Tolerated Procedure well.  

## 2019-06-20 NOTE — Op Note (Signed)
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  Patient's cuff was reasonably intact but we are worried about the integrity of the cuff going forward of his age as well as the instability possibility with a B2 glenoid.  We decided to go with reverse total shoulder arthroplasty.  X-ray nerve intact at the end of the case.  Robust fixation both humerus and glenoid.  Post-operative plan: The patient will be NWB in sling.  The patient will be will be discharged from PACU if continues to be stable as was plan prior to surgery.  DVT prophylaxis not indicated in this ambulatory upper extremity patient without significant risk factors.  Pain control with PRN pain medication preferring oral medicines.  Follow up plan will be scheduled in approximately 7 days for incision check and XR.  Physical therapy to start after first visit.  Implants: Tornier size 5 stem, 0 high offset tray with a 6 poly-, 39 glenosphere with a 25 baseplate and 35 center screw.  2 locking screws placed.  Post-Op Diagnosis: Same Surgeons:Primary: Hiram Gash, MD Assistants:Caroline McBane PA-C Location: [facility] ROOM 06 Anesthesia: General with Exparel Interscalene Antibiotics: Ancef 2g preop, Vancomycin 1072m locally Tourniquet time: None Estimated Blood Loss: 1295Complications: None Specimens: None Implants: Implant Name Type Inv. Item Serial No. Manufacturer Lot No. LRB No. Used Action  BASEPLATE GLENOSPHERE 228UXSTD - [id number] BASEPLATE GLENOSPHERE 225DGSTD  [employer] [id number] 1 Implanted  BONE SCREW THREAD 6.5X35MM - [id number] BONE SCREW THREAD 6.5X35MM  [employer]  Left 1 Implanted  GLENOSPHERE STANDARD 39 - [id number] GLENOSPHERE  STANDARD 39  [employer] [id number] 1 Implanted  SCREW PERIPHERAL 5.0X34 - [id number] SCREW PERIPHERAL 5.0X34  [employer]  Left 1 Implanted  SCREW 5.0X38 SMALL F/PERFORM - [id number] SCREW 5.0X38 SMALL F/PERFORM  [employer]  Left 1 Implanted  STEM HUMERAL AEQUALIS 5BX82 - [id number] STEM HUMERAL AEQUALIS 5BX82  [employer] [id number] 1 Implanted  IMPLANT REVERSE SHOULDER 0X3.5 - [id number] IMPLANT REVERSE SHOULDER [id number] 1 Implanted  INSERT REV KIT SHOULDER 6X39 - [id number] INSERT REV KIT SHOULDER 6X39  TJettie [id number] 1 Implanted    Indications for Surgery:   Wayne POPis a 86y.o. male with end-stage bone-on-bone osteoarthritis of the left shoulder with a B2 glenoid.  Benefits and risks of operative and nonoperative management were discussed prior to surgery with patient/guardian(s) and informed consent form was completed.  Infection and need for further surgery were discussed as was prosthetic stability and cuff issues.  We additionally specifically discussed risks of axillary nerve injury, infection, periprosthetic fracture, continued pain and longevity of implants prior to beginning procedure.      Procedure:   The patient was identified in the preoperative holding area where the surgical site was marked. Block placed by anesthesia with exparel.  The patient was taken to the OR where a procedural timeout was called and the above noted anesthesia was induced.  The patient was positioned beachchair on allen table with spider arm positioner.  Preoperative antibiotics were dosed.  The patient's left shoulder was prepped and draped in the usual sterile fashion.  A second preoperative timeout was called.  Standard deltopectoral approach was performed with a #10 blade. We dissected down to the subcutaneous tissues and the cephalic vein was taken laterally with the deltoid. Clavipectoral fascia was incised in  line with the incision. Deep retractors were placed. The long of the biceps tendon was identified and there was significant tenosynovitis present.  Tenodesis was performed to the pectoralis tendon with #2 Ethibond. The remaining biceps was followed up into the rotator interval where it was released.   The subscapularis was taken down in a full thickness layer with capsule along the humeral neck extending inferiorly around the humeral head. We continued releasing the capsule directly off of the osteophytes inferiorly all the way around the corner. This allowed [place] to dislocate the humeral head.   The humeral head had evidence of severe osteoarthritic wear with full-thickness cartilage loss and exposed subchondral bone. There was significant flattening of the humeral head.   Due to the patient's glenoid deformity as well as his age we felt that reverse would be more predictable for him.  We decided on reverse social arthroplasty.  There were osteophytes along the inferior humeral neck. The osteophytes were removed with an osteotome and a rongeur.  Osteophytes were removed with a rongeur and an osteotome and the anatomic neck was well visualized.     A humeral cutting guide was inserted down the intramedullary canal. The version was set at 20 of retroversion. Humeral osteotomy was performed with an oscillating saw. The head fragment was passed off the back table. A starter awl was used to open the humeral canal. We next used T-handle straight sound reamers to ream up to an appropriate fit. A chisel was used to remove proximal humeral bone. We then broached starting with a size one broach and broaching up to 5 which obtained an appropriate fit. The broach handle was removed. A cut protector was placed. The broach handle was removed and a cut protector was placed. The humerus was retracted posteriorly and we turned our attention to glenoid exposure.  The subscapularis was again identified and immediately we  took care to palpate the axillary nerve anteriorly and verify its position with gentle palpation as well as the tug test.  We then released the SGHL with bovie cautery prior to placing a curved mayo at the junction of the anterior glenoid well above the axillary nerve and bluntly dissecting the subscapularis from the capsule.  We then carefully protected the axillary nerve as we gently released the inferior capsule to fully mobilize the subscapularis.  An anterior deltoid retractor was then placed as well as a small Hohmann retractor superiorly.   The glenoid was inspected and had evidence of severe osteoarthritic wear with full-thickness cartilage loss and exposed subchondral bone. The remaining labrum was removed circumferentially taking great care not to disrupt the posterior capsule.   The glenoid drill guide was placed and used to drill a guide pin in the center, inferior position. The glenoid face was then reamed concentrically over the guide wire. The center hole was drilled over the guidepin in a near anatomic angle of version. Next the  glenoid vault was drilled back to a depth of 35 mm.  We tapped and then placed a 24m size baseplate with 284mlateralization was selected with a 6.5 mm x 35 mm length central screw.  The base plate was screwed into the glenoid vault obtaining secure fixation. We next placed superior and inferior locking screws for additional fixation.  Next a 39 mm glenosphere was  selected and impacted onto the baseplate. The center screw was tightened.  We turned attention back to the humeral side. The cut protector was removed. We trialed with multiple size tray and polyethylene options and selected a 6 which provided good stability and range of motion without excess soft tissue tension. The offset was dialed in to match the normal anatomy. The shoulder was trialed.  There was good ROM in all planes and the shoulder was stable with no inferior translation.  The real humeral  implants were opened after again confirming sizes.  The trial was removed. #5 Fiberwire x4 sutures passed through the humeral neck for subscap repair. The humeral component was press-fit obtaining a secure fit. A +0 high offset tray was selected and impacted onto the stem.  A 36+6 polyethylene liner was impacted onto the stem.  The joint was reduced and thoroughly irrigated with pulsatile lavage. Subscap was repaired back with #5 Fiberwire sutures through bone tunnels. Hemostasis was obtained. The deltopectoral interval was reapproximated with #1 Ethibond. The subcutaneous tissues were closed with 2-0 Vicryl and the skin was closed with running monocryl.    The wounds were cleaned and dried and an Aquacel dressing was placed. The drapes taken down. The arm was placed into sling with abduction pillow. Patient was awakened, extubated, and transferred to the recovery room in stable condition. There were no intraoperative complications. The sponge, needle, and attention counts were  correct at the end of the case.        Noemi Chapel, PA-C, present and scrubbed throughout the case, critical for completion in a timely fashion, and for retraction, instrumentation, closure.

## 2019-06-20 NOTE — Anesthesia Procedure Notes (Signed)
Procedure Name: Intubation Performed by: Hodan Wurtz J, CRNA Pre-anesthesia Checklist: Patient identified, Emergency Drugs available, Suction available, Patient being monitored and Timeout performed Patient Re-evaluated:Patient Re-evaluated prior to induction Oxygen Delivery Method: Circle system utilized Preoxygenation: Pre-oxygenation with 100% oxygen Induction Type: IV induction Ventilation: Mask ventilation without difficulty Laryngoscope Size: Mac and 3 Grade View: Grade I Tube type: Oral Tube size: 7.5 mm Number of attempts: 1 Airway Equipment and Method: Stylet Placement Confirmation: ETT inserted through vocal cords under direct vision,  positive ETCO2 and breath sounds checked- equal and bilateral Secured at: 23 cm Tube secured with: Tape Dental Injury: Teeth and Oropharynx as per pre-operative assessment        

## 2019-06-20 NOTE — Interval H&P Note (Signed)
All questions answered. Patient would like to go home is medically stable and we will reassess postop.

## 2019-06-21 ENCOUNTER — Encounter: Payer: Self-pay | Admitting: *Deleted

## 2019-06-21 NOTE — Discharge Summary (Addendum)
Patient ID: Wayne Mcguire MRN: [id number] DOB/AGE: 10-04-1932 86 y.o.  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 Acute non-recurrent pansinusitis 04/18/2019  . 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 Adult situational stress disorder   . Anxiety   . Bladder cancer ([place])   . Chest pain in adult 08/23/2016   Stress echo test done 09/05/15 Normal at 7 mets      . Chronic anticoagulation 11/15/2016  . Chronic anxiety 09/09/2016  . Chronic insomnia 09/09/2016  . Chronic pain of both knees 10/06/2017  . Chronic pain of both shoulders 09/14/2018  . Constipation 10/02/2018  . Costochondral chest pain 09/07/2016  . Cough with hemoptysis 01/06/2017  . Depression   . Dysuria 01/06/2017  . Emphysema lung ([facility])    Mild  . Esophagitis   . GERD (gastroesophageal reflux disease)   . GERD without esophagitis 09/09/2016  . High risk medication use 08/23/2016   Flecanide  . Hypotension 03/10/2018  . Low back pain 11/06/2018  . Lumbar herniated disc   . Lung nodule < 6cm on CT 09/30/2017  . Malaise and fatigue 09/30/2017  . Medicare annual wellness visit, subsequent 09/30/2017  . Nonsustained ventricular tachycardia ([place])   . Orchitis 11/06/2018  . PAF (paroxysmal atrial fibrillation) ([place]) 08/23/2016   CHADS2 vasc=2, he has elected not to accept anticoagulation  . Penile bleeding 10/02/2018  . Penile discharge 11/06/2018  . Peripheral edema 11/06/2018  . 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 Productive cough 11/06/2018  . Seasonal allergies   . Trochanteric bursitis of left hip 10/06/2017  . Unsteady gait 04/03/2018  . Ventricular tachycardia ([place]) 03/06/2019  . Vertigo   . Vitamin D deficiency 09/30/2017    Procedures Performed: Left reverse total Shoulder Arthroplasty  Discharged  Condition: good/stable  Hospital Course: Patient brought into [facility] for scheduled procedure. He tolerated procedure well.  He was kept for monitoring in PACU for pain control and medical monitoring postop. Due to current COVID-19 pandemic, patient's family wanted the patient to be discharged home after surgery. He was found to be stable for DC home the same evening after surgery. Patient was instructed on specific activity restrictions and all questions were answered. He was provided on call emergency line number for any emergencies over night as Dr. Griffin Basil and I are on call. We will call the patient in the morning to check on him and be available for any concerns. Patient and his wife felt comfortable and happy with this plan. Patient discharged home with his wife.   Consults: None  Significant Diagnostic Studies: No additional pertinent studies  Treatments: Surgery  Discharge Exam: Left upper extremity: Dressing CDI and sling well fitting,  full and painless ROM throughout hand with DPC of 0.  Axillary nerve sensation/motor altered in setting of block and unable to be fully tested.  Distal motor and sensory altered in setting of block.   Disposition: Discharge disposition: 01-Home or Self Care       Discharge Instructions    Call MD for:  persistant nausea and vomiting   Complete by: As directed    Call MD for:  redness, tenderness, or signs of infection (pain, swelling, redness, odor or green/yellow discharge around incision site)   Complete by: As directed    Call MD for:  severe uncontrolled pain   Complete by: As directed    Diet - low sodium heart healthy   Complete by: As directed    Discharge instructions   Complete by: As directed    Ophelia Charter MD, MPH Noemi Chapel, PA-C [place]. [address] [phone] (tel)   [phone] (fax)   [place]  may leave the operative dressing in place until your follow-up appointment. KEEP THE INCISIONS CLEAN AND DRY. There may be a small amount of fluid/bleeding leaking at the surgical site. This is normal after surgery.  If it fills with liquid or blood please call us immediately to change it for you. Use the provided ice machine or Ice packs as often as possible for the first 3-4 days, then as needed for pain relief.  Keep a layer of cloth or a shirt between your skin and the cooling unit to prevent frost bite as it can get very cold.  SHOWERING: - You may shower on Post-Op Day #2.  - The dressing is water resistant but do not scrub it as it may start to peel up.   - You may remove the sling for showering, but keep a water resistant pillow under the arm to keep both the  elbow and shoulder away from the body (mimicking the abduction sling).  - Gently pat the area dry.  - Do not soak the shoulder in water. Do not go swimming in the pool or ocean until your sutures are removed. - KEEP THE INCISIONS CLEAN AND DRY.  EXERCISES Wear the sling at all times except when doing your exercises. You may remove the sling for showering, but keep the arm across the chest or in a secondary sling.    Accidental/Purposeful External Rotation and shoulder flexion (reaching behind you) is to be avoided at all costs for the first month. It is ok to come out of your sling if your are sitting and have assistance for eating.  Do not lift anything heavier than 1 pound until we discuss it further in clinic. Please perform the exercises:   Elbow / Hand / Wrist  Range of Motion Exercises Grip strengthening   REGIONAL ANESTHESIA (NERVE BLOCKS) The anesthesia team may have performed a nerve block for you if safe in the setting of your care.  This is a great tool used to minimize pain.  Typically the block may start wearing off overnight but the long acting medicine may last for 3-4 days.  The nerve block wearing off can be a  challenging period but please utilize your as needed pain medications to try and manage this period.    POST-OP MEDICATIONS- Multimodal approach to pain control In general your pain will be controlled with a combination of substances.  Prescriptions unless otherwise discussed are electronically sent to your pharmacy.  This is a carefully made plan we use to minimize narcotic use.    Acetaminophen - Non-narcotic pain medicine taken on a scheduled basis  Oxycodone - This is a strong narcotic, to be used only on an "as needed" basis for pain. Omeprazole - daily medicine to protect your stomach while taking anti-inflammatories.  This may only be used in higher risk patients. Zofran -  take as needed for nausea  Meloxicam/Celebrex - these are anti-inflammatory and pain relievers.  Do not take additional ibuprofen, naproxen or other NSAID while taking this medicine.   FOLLOW-UP If you develop  a Fever (>101.5), Redness or Drainage from the surgical incision site, please call our office to arrange for an evaluation. Please call the office to schedule a follow-up appointment for a wound check, 7-10 days post-operatively.  IF YOU HAVE ANY QUESTIONS, PLEASE FEEL FREE TO CALL OUR OFFICE.  HELPFUL INFORMATION  If you had a block, it will wear off between 8-24 hrs postop typically.  This is period when your pain may go from nearly zero to the pain you would have had post-op without the block.  This is an abrupt transition but nothing dangerous is happening.  You may take an extra dose of narcotic when this happens.  Your arm will be in a sling following surgery. You will be in this sling for the next 3-4 weeks.  I will let you know the exact duration at your follow-up visit.  You may be more comfortable sleeping in a semi-seated position the first few nights following surgery.  Keep a pillow propped under the elbow and forearm for comfort.  If you have a recliner type of chair it might be beneficial.  If  not that is fine too, but it would be helpful to sleep propped up with pillows behind your operated shoulder as well under your elbow and forearm.  This will reduce pulling on the suture lines.  When dressing, put your operative arm in the sleeve first.  When getting undressed, take your operative arm out last.  Loose fitting, button-down shirts are recommended.  In most states it is against the law to drive while your arm is in a sling. And certainly against the law to drive while taking narcotics.  You may return to work/school in the next couple of days when you feel up to it. Desk work and typing in the sling is fine.  We suggest you use the pain medication the first night prior to going to bed, in order to ease any pain when the anesthesia wears off. You should avoid taking pain medications on an empty stomach as it will make you nauseous.  Do not drink alcoholic beverages or take illicit drugs when taking pain medications.  Pain medication may make you constipated.  Below are a few solutions to try in this order: Decrease the amount of pain medication if you aren't having pain. Drink lots of decaffeinated fluids. Drink prune juice and/or each dried prunes  If the first 3 don't work start with additional solutions Take Colace - an over-the-counter stool softener Take Senokot - an over-the-counter laxative Take Miralax - a stronger over-the-counter laxative   Dental Antibiotics:  In most cases prophylactic antibiotics for Dental procdeures after total joint surgery are not necessary.  Exceptions are as follows:  1. History of prior total joint infection  2. Severely immunocompromised (Organ Transplant, cancer chemotherapy, Rheumatoid biologic meds such as [place])  3. Poorly controlled diabetes (A1C &gt; 8.0, blood glucose over 200)  If you have one of these conditions, contact your surgeon for an antibiotic prescription, prior to your dental procedure.   Increase activity  slowly   Complete by: As directed      Allergies as of 06/20/2019      Reactions   Terbinafine And Related Other (See Comments)   Dizziness and blurry vision, may cause irregular heart beat.      Medication List    STOP taking these medications   Tylenol 8 Hour 650 MG CR tablet Generic drug: acetaminophen Replaced by: acetaminophen 500 MG tablet  TAKE these medications   acetaminophen 500 MG tablet Commonly known as: TYLENOL Take 2 tablets (1,000 mg total) by mouth every 8 (eight) hours for 14 days. Replaces: Tylenol 8 Hour 650 MG CR tablet   apixaban 5 MG Tabs tablet Commonly known as: Eliquis Take 1 tablet (5 mg total) by mouth 2 (two) times daily.   CRANBERRY PO Take 1 tablet by mouth daily.   fluticasone 50 MCG/ACT nasal spray Commonly known as: FLONASE Place 1 spray into both nostrils daily.   ICAPS AREDS 2 PO Take 2 capsules by mouth daily.   Hair Skin and Nails Formula Tabs Take 1 tablet by mouth daily.   magnesium oxide 400 MG tablet Commonly known as: MAG-OX Take 400 mg by mouth daily.   MUCINEX DM PO Take 1 tablet by mouth 2 (two) times daily.   multivitamin tablet Take 1 tablet by mouth daily.   nitroGLYCERIN 0.4 MG SL tablet Commonly known as: NITROSTAT Place 1 tablet (0.4 mg total) under the tongue every 5 (five) minutes as needed for chest pain.   omeprazole 20 MG capsule Commonly known as: PRILOSEC Take 20 mg by mouth daily.   ondansetron 4 MG tablet Commonly known as: Zofran Take 1 tablet (4 mg total) by mouth every 8 (eight) hours as needed for up to 7 days for nausea or vomiting.   OVER THE COUNTER MEDICATION Take 1 capsule by mouth in the morning and at bedtime. IB Gard   oxyCODONE 5 MG immediate release tablet Commonly known as: Oxy IR/ROXICODONE Take 1-2 pills every 6 hrs as needed for pain, no more than 6 per day   Pataday 0.1 % ophthalmic solution Generic drug: olopatadine Place 1 drop into both eyes daily as needed for  allergies.   Probiotic Daily Caps Take 1 capsule by mouth daily.   traMADol 50 MG tablet Commonly known as: ULTRAM Take 50 mg by mouth as needed.   traZODone 150 MG tablet Commonly known as: DESYREL Take 150 mg by mouth at bedtime.   Vitamin D-3 125 MCG (5000 UT) Tabs Take 1 tablet by mouth daily.

## 2019-10-23 ENCOUNTER — Other Ambulatory Visit: Payer: Self-pay | Admitting: Cardiology

## 2019-10-23 DIAGNOSIS — I480 Paroxysmal atrial fibrillation: Secondary | ICD-10-CM

## 2019-10-23 NOTE — Telephone Encounter (Signed)
*  STAT* If patient is at the pharmacy, call can be transferred to refill team.   1. Which medications need to be refilled? (please list name of each medication and dose if known) nitroGLYCERIN (NITROSTAT) 0.4 MG SL tablet  2. Which pharmacy/location (including street and city if local pharmacy) is medication to be sent to? [place], [facility]  3. Do they need a 30 day or 90 day supply? [place]

## 2019-10-24 MED ORDER — NITROGLYCERIN 0.4 MG SL SUBL: 0.4000 mg | SUBLINGUAL_TABLET | SUBLINGUAL | 5 refills | Status: DC | PRN

## 2019-10-24 NOTE — Telephone Encounter (Signed)
Refill sent in per request.  

## 2019-11-14 ENCOUNTER — Telehealth: Payer: Self-pay | Admitting: Cardiology

## 2019-11-14 DIAGNOSIS — I480 Paroxysmal atrial fibrillation: Secondary | ICD-10-CM

## 2019-11-14 MED ORDER — APIXABAN 5 MG PO TABS: 5.0000 mg | ORAL_TABLET | Freq: Two times a day (BID) | ORAL | 1 refills | Status: DC

## 2019-11-14 NOTE — Telephone Encounter (Signed)
°*  STAT* If patient is at the pharmacy, call can be transferred to refill team.   1. Which medications need to be refilled? (please list name of each medication and dose if known) Eliquis   2. Which pharmacy/location (including street and city if local pharmacy) is medication to be sent to? [facility]  3. Do they need a 30 day or 90 day supply? Would like  90

## 2019-11-14 NOTE — Telephone Encounter (Signed)
Prescription refill request for Eliquis received. Indication:  Atrial Fibrillation Last office visit: 05/2019 Agustin Cree Scr: 0.97  05/2019 Age: 86 Weight: 82 kg  Prescription refilled

## 2019-11-15 ENCOUNTER — Ambulatory Visit: Payer: Medicare HMO | Admitting: Cardiology

## 2019-11-27 DIAGNOSIS — R1032 Left lower quadrant pain: Secondary | ICD-10-CM

## 2019-11-27 DIAGNOSIS — K921 Melena: Secondary | ICD-10-CM | POA: Insufficient documentation

## 2019-11-27 HISTORY — DX: Left lower quadrant pain: R10.32

## 2019-11-27 HISTORY — DX: Melena: K92.1

## 2019-12-13 ENCOUNTER — Telehealth: Payer: Self-pay | Admitting: Cardiology

## 2019-12-13 NOTE — Telephone Encounter (Signed)
Recommendation give to pt's wife (ok per DPR) as per Dr. Agustin Cree. She verbalized understanding and only had a question as to why the hospital did not tell the pt why he was being placed on the medication. I advised her to call the hospital and discuss this with the director of the unit for her concerns.

## 2019-12-13 NOTE — Telephone Encounter (Signed)
Patient's wife is calling to make Dr. Agustin Cree aware that the patient was in the hospital from 12/09/19-12/11/19, where he was prescribed Metoprolol 25 MG (1/2 a tablet twice daily). She is requesting a call back to ensure that patient is eligible to take this medication. Please advise.

## 2019-12-13 NOTE — Telephone Encounter (Signed)
Message left for pt to call back.

## 2019-12-13 NOTE — Telephone Encounter (Signed)
Should be fine for him to take it but we need to watch also for his heart rate.  Please ask him to check his heart rate from time to time and let us know if is less than 55

## 2019-12-17 ENCOUNTER — Telehealth: Payer: Self-pay | Admitting: Cardiology

## 2019-12-17 NOTE — Telephone Encounter (Signed)
Appointment made as requested

## 2019-12-17 NOTE — Telephone Encounter (Signed)
New message:    Patient wife calling to see if patient can see the doctor sooner than 11/21. Patient BP has been ruining low.

## 2019-12-24 DIAGNOSIS — Z09 Encounter for follow-up examination after completed treatment for conditions other than malignant neoplasm: Secondary | ICD-10-CM | POA: Insufficient documentation

## 2019-12-24 HISTORY — DX: Encounter for follow-up examination after completed treatment for conditions other than malignant neoplasm: Z09

## 2019-12-25 ENCOUNTER — Other Ambulatory Visit: Payer: Self-pay

## 2019-12-26 ENCOUNTER — Ambulatory Visit: Payer: Medicare HMO | Admitting: Cardiology

## 2019-12-26 ENCOUNTER — Encounter: Payer: Self-pay | Admitting: Cardiology

## 2019-12-26 ENCOUNTER — Other Ambulatory Visit: Payer: Self-pay

## 2019-12-26 VITALS — BP 118/60 | HR 70 | Ht 73.0 in | Wt 176.2 lb

## 2019-12-26 DIAGNOSIS — K921 Melena: Secondary | ICD-10-CM

## 2019-12-26 DIAGNOSIS — I480 Paroxysmal atrial fibrillation: Secondary | ICD-10-CM

## 2019-12-26 DIAGNOSIS — I472 Ventricular tachycardia, unspecified: Secondary | ICD-10-CM

## 2019-12-26 DIAGNOSIS — R5381 Other malaise: Secondary | ICD-10-CM

## 2019-12-26 DIAGNOSIS — R5383 Other fatigue: Secondary | ICD-10-CM

## 2019-12-26 MED ORDER — AMIODARONE HCL 200 MG PO TABS
200.0000 mg | ORAL_TABLET | Freq: Two times a day (BID) | ORAL | 1 refills | Status: DC
Start: 2019-12-26 — End: 2020-01-10

## 2019-12-26 NOTE — Patient Instructions (Signed)
Medication Instructions:  Your physician has recommended you make the following change in your medication:  STOP: Metoprolol   START: Amiodarone to 200 mg twice daily   *If you need a refill on your cardiac medications before your next appointment, please call your pharmacy*   Lab Work: None If you have labs (blood work) drawn today and your tests are completely normal, you will receive your results only by: Marland Kitchen MyChart Message (if you have MyChart) OR . A paper copy in the mail If you have any lab test that is abnormal or we need to change your treatment, we will call you to review the results.   Testing/Procedures: None   Follow-Up: At [facility], you and your health needs are our priority.  As part of our continuing mission to provide you with exceptional heart care, we have created designated Provider Care Teams.  These Care Teams include your primary Cardiologist (physician) and Advanced Practice Providers (APPs -  Physician Assistants and Nurse Practitioners) who all work together to provide you with the care you need, when you need it.  We recommend signing up for the patient portal called "MyChart".  Sign up information is provided on this After Visit Summary.  MyChart is used to connect with patients for Virtual Visits (Telemedicine).  Patients are able to view lab/test results, encounter notes, upcoming appointments, etc.  Non-urgent messages can be sent to your provider as well.   To learn more about what you can do with MyChart, go to [email or web address].    Your next appointment:   2 week(s)  The format for your next appointment:   In Person  Provider:   [name], MD   Other Instructions  Amiodarone tablets What is this medicine? AMIODARONE (a MEE oh da rone) is an antiarrhythmic drug. It helps make your heart beat regularly. Because of the side effects caused by this medicine, it is only used when other medicines have not worked. It is usually  used for heartbeat problems that may be life threatening. This medicine may be used for other purposes; ask your health care provider or pharmacist if you have questions. COMMON BRAND NAME(S): Cordarone, Pacerone What should I tell my health care provider before I take this medicine? They need to know if you have any of these conditions:  liver disease  lung disease  other heart problems  thyroid disease  an unusual or allergic reaction to amiodarone, iodine, other medicines, foods, dyes, or preservatives  pregnant or trying to get pregnant  breast-feeding How should I use this medicine? Take this medicine by mouth with a glass of water. Follow the directions on the prescription label. You can take this medicine with or without food. However, you should always take it the same way each time. Take your doses at regular intervals. Do not take your medicine more often than directed. Do not stop taking except on the advice of your doctor or health care professional. A special MedGuide will be given to you by the pharmacist with each prescription and refill. Be sure to read this information carefully each time. Talk to your pediatrician regarding the use of this medicine in children. Special care may be needed. Overdosage: If you think you have taken too much of this medicine contact a poison control center or emergency room at once. NOTE: This medicine is only for you. Do not share this medicine with others. What if I miss a dose? If you miss a dose, take it as soon as  you can. If it is almost time for your next dose, take only that dose. Do not take double or extra doses. What may interact with this medicine? Do not take this medicine with any of the following medications:  abarelix  apomorphine  arsenic trioxide  certain antibiotics like erythromycin, gemifloxacin, levofloxacin, pentamidine  certain medicines for depression like amoxapine, tricyclic antidepressants  certain  medicines for fungal infections like fluconazole, itraconazole, ketoconazole, posaconazole, voriconazole  certain medicines for irregular heart beat like disopyramide, dronedarone, ibutilide, propafenone, sotalol  certain medicines for malaria like chloroquine, halofantrine  cisapride  droperidol  haloperidol  hawthorn  maprotiline  methadone  phenothiazines like chlorpromazine, mesoridazine, thioridazine  pimozide  ranolazine  red yeast rice  vardenafil This medicine may also interact with the following medications:  antiviral medicines for HIV or AIDS  certain medicines for blood pressure, heart disease, irregular heart beat  certain medicines for cholesterol like atorvastatin, cerivastatin, lovastatin, simvastatin  certain medicines for hepatitis C like sofosbuvir and ledipasvir; sofosbuvir  certain medicines for seizures like phenytoin  certain medicines for thyroid problems  certain medicines that treat or prevent blood clots like warfarin  cholestyramine  cimetidine  clopidogrel  cyclosporine  dextromethorphan  diuretics  dofetilide  fentanyl  general anesthetics  grapefruit juice  lidocaine  loratadine  methotrexate  other medicines that prolong the QT interval (cause an abnormal heart rhythm)  procainamide  quinidine  rifabutin, rifampin, or rifapentine  St. John's Wort  trazodone  ziprasidone This list may not describe all possible interactions. Give your health care provider a list of all the medicines, herbs, non-prescription drugs, or dietary supplements you use. Also tell them if you smoke, drink alcohol, or use illegal drugs. Some items may interact with your medicine. What should I watch for while using this medicine? Your condition will be monitored closely when you first begin therapy. Often, this drug is first started in a hospital or other monitored health care setting. Once you are on maintenance therapy, visit  your doctor or health care professional for regular checks on your progress. Because your condition and use of this medicine carry some risk, it is a good idea to carry an identification card, necklace or bracelet with details of your condition, medications, and doctor or health care professional. Dennis Bast may get drowsy or dizzy. Do not drive, use machinery, or do anything that needs mental alertness until you know how this medicine affects you. Do not stand or sit up quickly, especially if you are an older patient. This reduces the risk of dizzy or fainting spells. This medicine can make you more sensitive to the sun. Keep out of the sun. If you cannot avoid being in the sun, wear protective clothing and use sunscreen. Do not use sun lamps or tanning beds/booths. You should have regular eye exams before and during treatment. Call your doctor if you have blurred vision, see halos, or your eyes become sensitive to light. Your eyes may get dry. It may be helpful to use a lubricating eye solution or artificial tears solution. If you are going to have surgery or a procedure that requires contrast dyes, tell your doctor or health care professional that you are taking this medicine. What side effects may I notice from receiving this medicine? Side effects that you should report to your doctor or health care professional as soon as possible:  allergic reactions like skin rash, itching or hives, swelling of the face, lips, or tongue  blue-gray coloring of the skin  blurred vision, seeing blue green halos, increased sensitivity of the eyes to light  breathing problems  chest pain  dark urine  fast, irregular heartbeat  feeling faint or light-headed  intolerance to heat or cold  nausea or vomiting  pain and swelling of the scrotum  pain, tingling, numbness in feet, hands  redness, blistering, peeling or loosening of the skin, including inside the mouth  spitting up blood  stomach  pain  sweating  unusual or uncontrolled movements of body  unusually weak or tired  weight gain or loss  yellowing of the eyes or skin Side effects that usually do not require medical attention (report to your doctor or health care professional if they continue or are bothersome):  change in sex drive or performance  constipation  dizziness  headache  loss of appetite  trouble sleeping This list may not describe all possible side effects. Call your doctor for medical advice about side effects. You may report side effects to FDA at 1-800-FDA-1088. Where should I keep my medicine? Keep out of the reach of children. Store at room temperature between 20 and 25 degrees C (68 and 77 degrees F). Protect from light. Keep container tightly closed. Throw away any unused medicine after the expiration date. NOTE: This sheet is a summary. It may not cover all possible information. If you have questions about this medicine, talk to your doctor, pharmacist, or health care provider.  2020 Elsevier/Gold Standard (2018-01-11 13:44:04)

## 2019-12-26 NOTE — Progress Notes (Signed)
g

## 2019-12-26 NOTE — Progress Notes (Signed)
Cardiology Office Note:    Date:  12/26/2019   ID:  [name], DOB 1932/06/05, MRN [id number]  PCP:  [name], MD  Cardiologist:  [name], MD    Referring MD: [name], MD   No chief complaint on file. Am still weak and tired  History of Present Illness:    Wayne Mcguire is a 86 y.o. male with past medical history significant for paroxysmal atrial fibrillation, he did have electrical cardioversion couple years ago, atypical chest pain, nonsustained ventricular tachycardia, recent admission to [facility] because of GI bleed.  He comes today to my office for follow-up.  He is feeling poorly he is in atrial fibrillation and he thinks it is related to his atrial fibrillation the way he feels.  He did have colonoscopy done he did have diverticulosis, he is back on anticoagulation.  He comes today to my office to talk about his issues.  Previously we aggressively investigated presence of nonsustained ventricular tachycardia on monitor that include echocardiogram showing ejection fraction be normal left atrium was normal in size, he did have a stress test which showed no evidence of ischemia.  He denies have any chest pain tightness squeezing pressure burning chest no dizziness no passing out no swelling of lower extremities.  Past Medical History:  Diagnosis Date  . Acute non-recurrent pansinusitis 04/18/2019  . 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 Adult situational stress disorder   . Anxiety   . Bladder cancer ([place])   . Chest pain in adult 08/23/2016   Stress echo test done 09/05/15 Normal at 7 mets      . Chronic anticoagulation 11/15/2016  . Chronic anxiety 09/09/2016  . Chronic insomnia 09/09/2016  . Chronic pain of both knees 10/06/2017  . Chronic pain of both shoulders 09/14/2018  . Constipation 10/02/2018  . Costochondral chest pain 09/07/2016  . Cough with hemoptysis 01/06/2017  . Depression   . Dysuria 01/06/2017  . Emphysema lung (HCC)    Mild   . Esophagitis   . GERD (gastroesophageal reflux disease)   . GERD without esophagitis 09/09/2016  . High risk medication use 08/23/2016   Flecanide  . Hypotension 03/10/2018  . Low back pain 11/06/2018  . Lumbar herniated disc   . Lung nodule < 6cm on CT 09/30/2017  . Malaise and fatigue 09/30/2017  . Medicare annual wellness visit, subsequent 09/30/2017  . Nonsustained ventricular tachycardia ([place])   . Orchitis 11/06/2018  . PAF (paroxysmal atrial fibrillation) ([place]) 08/23/2016   CHADS2 vasc=2, he has elected not to accept anticoagulation  . Penile bleeding 10/02/2018  . Penile discharge 11/06/2018  . Peripheral edema 11/06/2018  . 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 Productive cough 11/06/2018  . Seasonal allergies   . Trochanteric bursitis of left hip 10/06/2017  . Unsteady gait 04/03/2018  . Ventricular tachycardia ([place]) 03/06/2019  . Vertigo   . Vitamin D deficiency 09/30/2017    Past Surgical History:  Procedure Laterality Date  . BLADDER SURGERY    . COLONOSCOPY    . TOTAL SHOULDER ARTHROPLASTY Left 06/20/2019   Procedure: TOTAL SHOULDER ARTHROPLASTY;  Surgeon: Hiram Gash, MD;  Location: [facility] ORS;  Service: Orthopedics;  Laterality: Left;  Marland Kitchen VEIN SURGERY Left     Current Medications: Current Meds  Medication Sig  . acetaminophen (TYLENOL) 500 MG tablet Take 500 mg by mouth in the morning and at bedtime.  Marland Kitchen amoxicillin (AMOXIL) 500 MG capsule Take 1,000 mg by mouth  2 (two) times daily.  Marland Kitchen apixaban (ELIQUIS) 5 MG TABS tablet Take 1 tablet (5 mg total) by mouth 2 (two) times daily.  . Cholecalciferol (VITAMIN D-3) 125 MCG (5000 UT) TABS Take 1 tablet by mouth daily.  . ciprofloxacin (CIPRO) 500 MG tablet Take 500 mg by mouth 2 (two) times daily.  Marland Kitchen CRANBERRY PO Take 1 tablet by mouth daily.  Marland Kitchen Dextromethorphan-guaiFENesin (MUCINEX DM PO) Take 1 tablet by mouth 2 (two) times daily.  . fluticasone (FLONASE) 50 MCG/ACT nasal spray Place 1 spray into both nostrils  daily.   . furosemide (LASIX) 20 MG tablet Take 20 mg by mouth as needed.  Marland Kitchen guaiFENesin (MUCINEX) 600 MG 12 hr tablet Take 600 mg by mouth in the morning and at bedtime.  . magnesium oxide (MAG-OX) 400 MG tablet Take 400 mg by mouth daily.  . metoprolol tartrate (LOPRESSOR) 25 MG tablet Take 25 mg by mouth daily.  . metroNIDAZOLE (FLAGYL) 500 MG tablet Take 500 mg by mouth 2 (two) times daily.  . Multiple Vitamin (MULTIVITAMIN ADULT PO) Take by mouth in the morning and at bedtime.  . Multiple Vitamin (MULTIVITAMIN) tablet Take 1 tablet by mouth daily.  . Multiple Vitamins-Minerals (HAIR SKIN AND NAILS FORMULA) TABS Take 1 tablet by mouth daily.  . Multiple Vitamins-Minerals (ICAPS AREDS 2 PO) Take 2 capsules by mouth daily.   . nitroGLYCERIN (NITROSTAT) 0.4 MG SL tablet Place 1 tablet (0.4 mg total) under the tongue every 5 (five) minutes as needed for chest pain.  Marland Kitchen olopatadine (PATADAY) 0.1 % ophthalmic solution Place 1 drop into both eyes daily as needed for allergies.  Marland Kitchen omeprazole (PRILOSEC) 20 MG capsule Take 20 mg by mouth daily.  Marland Kitchen OVER THE COUNTER MEDICATION Take 1 capsule by mouth in the morning and at bedtime. IB Gard  . Peppermint Oil (IBGARD) 90 MG CPCR Take 2 capsules by mouth daily.  . Probiotic Product (PROBIOTIC DAILY) CAPS Take 1 capsule by mouth daily.  . traMADol (ULTRAM) 50 MG tablet Take 50 mg by mouth as needed.   . traZODone (DESYREL) 150 MG tablet Take 150 mg by mouth at bedtime.      Allergies:   Terbinafine and related   Social History   Socioeconomic History  . Marital status: Married    Spouse name: Not on file  . Number of children: Not on file  . Years of education: Not on file  . Highest education level: Not on file  Occupational History  . Not on file  Tobacco Use  . Smoking status: Former Smoker    Packs/day: 1.00    Years: 70.00    Pack years: 70.00    Types: Cigarettes    Quit date: 04/2018    Years since quitting: 1.6  . Smokeless tobacco:  Never Used  Vaping Use  . Vaping Use: Never used  Substance and Sexual Activity  . Alcohol use: No  . Drug use: No  . Sexual activity: Not on file  Other Topics Concern  . Not on file  Social History Narrative  . Not on file   Social Determinants of Health   Financial Resource Strain:   . Difficulty of Paying Living Expenses: Not on file  Food Insecurity:   . Worried About Charity fundraiser in the Last Year: Not on file  . Ran Out of Food in the Last Year: Not on file  Transportation Needs:   . Lack of Transportation (Medical): Not on file  .  Lack of Transportation (Non-Medical): Not on file  Physical Activity:   . Days of Exercise per Week: Not on file  . Minutes of Exercise per Session: Not on file  Stress:   . Feeling of Stress : Not on file  Social Connections:   . Frequency of Communication with Friends and Family: Not on file  . Frequency of Social Gatherings with Friends and Family: Not on file  . Attends Religious Services: Not on file  . Active Member of Clubs or Organizations: Not on file  . Attends Archivist Meetings: Not on file  . Marital Status: Not on file     Family History: The patient's family history includes Heart attack in his brother; Heart failure in his father. ROS:   Please see the history of present illness.    All 14 point review of systems negative except as described per history of present illness  EKGs/Labs/Other Studies Reviewed:    EKG showed atrial flutter with controlled ventricular rate.  Recent Labs: 06/14/2019: BUN 10; Creatinine, Ser 0.97; Hemoglobin 12.6; Platelets 272; Potassium 4.0; Sodium 140  Recent Lipid Panel No results found for: CHOL, TRIG, HDL, CHOLHDL, VLDL, LDLCALC, LDLDIRECT  Physical Exam:    VS:  BP 118/60   Pulse 70   Ht 6\' 1"  (1.854 m)   Wt 176 lb 3.2 oz (79.9 kg)   SpO2 97%   BMI 23.25 kg/m     Wt Readings from Last 3 Encounters:  12/26/19 176 lb 3.2 oz (79.9 kg)  06/20/19 180 lb 12.4 oz  (82 kg)  06/14/19 181 lb 4 oz (82.2 kg)     GEN:  Well nourished, well developed in no acute distress HEENT: Normal NECK: No JVD; No carotid bruits LYMPHATICS: No lymphadenopathy CARDIAC: Irregular irregular, no murmurs, no rubs, no gallops RESPIRATORY:  Clear to auscultation without rales, wheezing or rhonchi  ABDOMEN: Soft, non-tender, non-distended MUSCULOSKELETAL:  No edema; No deformity  SKIN: Warm and dry LOWER EXTREMITIES: no swelling NEUROLOGIC:  Alert and oriented x 3 PSYCHIATRIC:  Normal affect   ASSESSMENT:    1. PAF (paroxysmal atrial fibrillation) ([place])   2. Ventricular tachycardia ([place])   3. Malaise and fatigue   4. Bloody stool    PLAN:    In order of problems listed above:  1. Persistent atrial fibrillation.  Apparently he was find to be in atrial fibrillation while in [facility].  At that time anticoagulation has been interrupted secondary to GI bleeding.  He is back on anticoagulation for about 2 weeks.  We talked about options and the situation I think we reached the point that we need to consider antiarrhythmic therapy.  I think with his comorbidities amiodarone will be the best choice.  I did review his echocardiogram from January showed normal left ventricle ejection fraction normal atrial size.  I will put him on amiodarone 200 mg twice daily and bring him back to the office in about 2 3 weeks to see if he still in atrial fibrillation if that is the case then will consider cardioversion.  He is taking anticoagulation will continue. 2. History of ventricular tachycardia however work-up so far negative in terms of stratification of this arrhythmia stress test negative echocardiogram normal. 3. Malaise and fatigue could be related to atrial fibrillation.  We will try to bring him back to normal rhythm I will check CBC today.  Plan as outlined above. 4. History of GI bleed.  Aggressive work-up done in [facility] which I did review  he was find to have some diverticulosis.  Also some erosions.   Medication Adjustments/Labs and Tests Ordered: Current medicines are reviewed at length with the patient today.  Concerns regarding medicines are outlined above.  No orders of the defined types were placed in this encounter.  Medication changes: No orders of the defined types were placed in this encounter.   Signed, Park Liter, MD, [facility] 12/26/2019 10:35 AM    [place]

## 2019-12-28 ENCOUNTER — Ambulatory Visit: Payer: Medicare HMO | Admitting: Cardiology

## 2020-01-07 ENCOUNTER — Ambulatory Visit: Payer: Medicare HMO | Admitting: Cardiology

## 2020-01-09 DIAGNOSIS — M5126 Other intervertebral disc displacement, lumbar region: Secondary | ICD-10-CM | POA: Insufficient documentation

## 2020-01-09 DIAGNOSIS — K209 Esophagitis, unspecified without bleeding: Secondary | ICD-10-CM | POA: Insufficient documentation

## 2020-01-09 DIAGNOSIS — F4320 Adjustment disorder, unspecified: Secondary | ICD-10-CM | POA: Insufficient documentation

## 2020-01-09 DIAGNOSIS — F32A Depression, unspecified: Secondary | ICD-10-CM | POA: Insufficient documentation

## 2020-01-09 DIAGNOSIS — J302 Other seasonal allergic rhinitis: Secondary | ICD-10-CM | POA: Insufficient documentation

## 2020-01-09 DIAGNOSIS — F419 Anxiety disorder, unspecified: Secondary | ICD-10-CM | POA: Insufficient documentation

## 2020-01-09 DIAGNOSIS — K219 Gastro-esophageal reflux disease without esophagitis: Secondary | ICD-10-CM | POA: Insufficient documentation

## 2020-01-09 DIAGNOSIS — I472 Ventricular tachycardia: Secondary | ICD-10-CM | POA: Insufficient documentation

## 2020-01-09 DIAGNOSIS — I4729 Other ventricular tachycardia: Secondary | ICD-10-CM | POA: Insufficient documentation

## 2020-01-09 DIAGNOSIS — C679 Malignant neoplasm of bladder, unspecified: Secondary | ICD-10-CM | POA: Insufficient documentation

## 2020-01-09 DIAGNOSIS — J439 Emphysema, unspecified: Secondary | ICD-10-CM | POA: Insufficient documentation

## 2020-01-10 ENCOUNTER — Encounter: Payer: Self-pay | Admitting: Cardiology

## 2020-01-10 ENCOUNTER — Other Ambulatory Visit: Payer: Self-pay

## 2020-01-10 ENCOUNTER — Ambulatory Visit: Payer: Medicare HMO | Admitting: Cardiology

## 2020-01-10 VITALS — BP 90/60 | HR 69 | Ht 73.0 in | Wt 184.0 lb

## 2020-01-10 DIAGNOSIS — I472 Ventricular tachycardia: Secondary | ICD-10-CM | POA: Diagnosis not present

## 2020-01-10 DIAGNOSIS — I4819 Other persistent atrial fibrillation: Secondary | ICD-10-CM

## 2020-01-10 DIAGNOSIS — Z79899 Other long term (current) drug therapy: Secondary | ICD-10-CM

## 2020-01-10 DIAGNOSIS — I4729 Other ventricular tachycardia: Secondary | ICD-10-CM

## 2020-01-10 DIAGNOSIS — J431 Panlobular emphysema: Secondary | ICD-10-CM

## 2020-01-10 HISTORY — DX: Other persistent atrial fibrillation: I48.19

## 2020-01-10 MED ORDER — AMIODARONE HCL 200 MG PO TABS
200.0000 mg | ORAL_TABLET | Freq: Every day | ORAL | 3 refills | Status: DC
Start: 2020-01-10 — End: 2020-05-12

## 2020-01-10 NOTE — Addendum Note (Signed)
Addended by: Mendel Ryder on: 01/10/2020 02:27 PM   Modules accepted: Orders

## 2020-01-10 NOTE — Patient Instructions (Signed)
Medication Instructions:  Decrease Amiodarone to 200 mg daily   *If you need a refill on your cardiac medications before your next appointment, please call your pharmacy*   Lab Work: Cbc, Bmp - Today   If you have labs (blood work) drawn today and your tests are completely normal, you will receive your results only by: Marland Kitchen MyChart Message (if you have MyChart) OR . A paper copy in the mail If you have any lab test that is abnormal or we need to change your treatment, we will call you to review the results.   Testing/Procedures: Your physician has recommended that you have a Cardioversion (DCCV). Electrical Cardioversion uses a jolt of electricity to your heart either through paddles or wired patches attached to your chest. This is a controlled, usually prescheduled, procedure. Defibrillation is done under light anesthesia in the hospital, and you usually go home the day of the procedure. This is done to get your heart back into a normal rhythm. You are not awake for the procedure. Please see the instruction sheet given to you today.   You will need a covid test 1 week before your procedure. You can go to urgent care to have this done. Please bring a copy of your negative covid test to our office so we can send it to the hospital    Follow-Up: At [facility], you and your health needs are our priority.  As part of our continuing mission to provide you with exceptional heart care, we have created designated Provider Care Teams.  These Care Teams include your primary Cardiologist (physician) and Advanced Practice Providers (APPs -  Physician Assistants and Nurse Practitioners) who all work together to provide you with the care you need, when you need it.  We recommend signing up for the patient portal called "MyChart".  Sign up information is provided on this After Visit Summary.  MyChart is used to connect with patients for Virtual Visits (Telemedicine).  Patients are able to view lab/test  results, encounter notes, upcoming appointments, etc.  Non-urgent messages can be sent to your provider as well.   To learn more about what you can do with MyChart, go to [email or web address].    Your next appointment:   6 week(s)  The format for your next appointment:   In Person  Provider:   [name], MD   Other Instructions You are scheduled for a cardioversion at [facility] [place], [place], [place] [address]   DIET: Nothing to eat or drink after midnight except a sip of water with medications (see medication instructions below)  Medication Instructions: Hold Lasix the morning of your procedure   Continue your anticoagulant: Eliquis  You will need to continue your anticoagulant after your procedure until you are told by your provider that it is safe to stop   You must have a responsible person to drive you home and stay in the waiting area during your procedure. Failure to do so could result in cancellation.  Bring your insurance cards.  *Special Note: Every effort is made to have your procedure done on time. Occasionally there are emergencies that occur at the hospital that may cause delays. Please be patient if a delay does occur.

## 2020-01-10 NOTE — Progress Notes (Signed)
Cardiology Office Note:    Date:  01/10/2020   ID:  Wayne Mcguire, DOB 1933-01-24, MRN [id number]  PCP:  [name], MD  Cardiologist:  [name], MD    Referring MD: [name], MD   Chief Complaint  Patient presents with  . Follow-up  I am weak tired and exhausted  History of Present Illness:    Wayne Mcguire is a 86 y.o. male with past medical history significant for paroxysmal atrial fibrillation, couple years ago he did have electrical cardioversion converted to sinus rhythm, also history of nonsustained ventricle tach tachycardia, recent admission to hospital because of GI bleeding that was more than a month ago anticoagulation has been discontinued temporarily he did have colonoscopy he was find to have diverticulosis after that he was put back on anticoagulation has been anticoagulation for at least 4 weeks.  Last time I seen him he was complaining having weak nests and fatigue and as well as tiredness, I decided to put him on amiodarone to see if I can convert him medically however it he comes today to my office and he is in atrial flutter which is atypical.  Ventricular rate is controlled.  As a part of evaluation before for ventricular tachycardia he did have echocardiogram done which showed preserved left ventricle ejection fraction, normal biatrial size, he also had stress test done which showed no evidence of ischemia.  Overall he is complained of being weak tired exhausted but denies have any chest pain tightness squeezing pressure burning chest.  No swelling of lower extremities.  Past Medical History:  Diagnosis Date  . Acute non-recurrent pansinusitis 04/18/2019  . 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 Adult situational stress disorder   . Anxiety   . Arthritis of left glenohumeral joint 06/20/2019  . Bladder cancer ([place])   . Bloody stool 11/27/2019  . Chest pain in adult 08/23/2016   Stress echo test done 09/05/15 Normal at 7 mets      . Chronic  anticoagulation 11/15/2016  . Chronic anxiety 09/09/2016  . Chronic insomnia 09/09/2016  . Chronic pain of both knees 10/06/2017  . Chronic pain of both shoulders 09/14/2018  . Constipation 10/02/2018  . Costochondral chest pain 09/07/2016  . Cough with hemoptysis 01/06/2017  . Depression   . Diarrhea 10/02/2018  . Dysuria 01/06/2017  . Emphysema lung (HCC)    Mild  . Esophagitis   . GERD (gastroesophageal reflux disease)   . GERD without esophagitis 09/09/2016  . High risk medication use 08/23/2016   Flecanide  . Hospital discharge follow-up 12/24/2019  . Hypotension 03/10/2018  . Left lower quadrant abdominal pain 11/27/2019  . Low back pain 11/06/2018  . Lumbar herniated disc   . Lung nodule < 6cm on CT 09/30/2017  . Malaise and fatigue 09/30/2017  . Medicare annual wellness visit, subsequent 09/30/2017  . Nonsustained ventricular tachycardia ([place])   . Orchitis 11/06/2018  . PAF (paroxysmal atrial fibrillation) ([place]) 08/23/2016   CHADS2 vasc=2, he has elected not to accept anticoagulation  . Penile bleeding 10/02/2018  . Penile discharge 11/06/2018  . Peripheral edema 11/06/2018  . 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 Productive cough 11/06/2018  . Seasonal allergies   . Trochanteric bursitis of left hip 10/06/2017  . Unsteady gait 04/03/2018  . Ventricular tachycardia ([place]) 03/06/2019  . Vertigo   . Vitamin D deficiency 09/30/2017    Past Surgical History:  Procedure Laterality Date  . BLADDER SURGERY    . COLONOSCOPY    .  TOTAL SHOULDER ARTHROPLASTY Left 06/20/2019   Procedure: TOTAL SHOULDER ARTHROPLASTY;  Surgeon: Hiram Gash, MD;  Location: [facility];  Service: Orthopedics;  Laterality: Left;  Marland Kitchen VEIN SURGERY Left     Current Medications: Current Meds  Medication Sig  . acetaminophen (TYLENOL) 500 MG tablet Take 500 mg by mouth in the morning and at bedtime.  Marland Kitchen amiodarone (PACERONE) 200 MG tablet Take 1 tablet (200 mg total) by mouth 2 (two) times daily.  Marland Kitchen  apixaban (ELIQUIS) 5 MG TABS tablet Take 1 tablet (5 mg total) by mouth 2 (two) times daily.  . Cholecalciferol (VITAMIN D-3) 125 MCG (5000 UT) TABS Take 1 tablet by mouth daily.  Marland Kitchen CRANBERRY PO Take 1 tablet by mouth daily.  Marland Kitchen Dextromethorphan-guaiFENesin (MUCINEX DM PO) Take 1 tablet by mouth 2 (two) times daily.  . fluticasone (FLONASE) 50 MCG/ACT nasal spray Place 1 spray into both nostrils daily.   . furosemide (LASIX) 20 MG tablet Take 20 mg by mouth as needed.  . magnesium oxide (MAG-OX) 400 MG tablet Take 400 mg by mouth daily.  . Multiple Vitamin (MULTIVITAMIN ADULT PO) Take by mouth in the morning and at bedtime.  . Multiple Vitamins-Minerals (HAIR SKIN AND NAILS FORMULA) TABS Take 1 tablet by mouth daily.  . Multiple Vitamins-Minerals (ICAPS AREDS 2 PO) Take 2 capsules by mouth daily.   . nitroGLYCERIN (NITROSTAT) 0.4 MG SL tablet Place 1 tablet (0.4 mg total) under the tongue every 5 (five) minutes as needed for chest pain.  Marland Kitchen olopatadine (PATADAY) 0.1 % ophthalmic solution Place 1 drop into both eyes daily as needed for allergies.  Marland Kitchen omeprazole (PRILOSEC) 20 MG capsule Take 20 mg by mouth daily.  Marland Kitchen OVER THE COUNTER MEDICATION Take 1 capsule by mouth in the morning and at bedtime. IB Gard  . Peppermint Oil (IBGARD) 90 MG CPCR Take 2 capsules by mouth daily.  . Probiotic Product (PROBIOTIC DAILY) CAPS Take 1 capsule by mouth daily.  . traMADol (ULTRAM) 50 MG tablet Take 50 mg by mouth as needed.   . traZODone (DESYREL) 150 MG tablet Take 150 mg by mouth at bedtime.   . [DISCONTINUED] amoxicillin (AMOXIL) 500 MG capsule Take 1,000 mg by mouth 2 (two) times daily.  . [DISCONTINUED] ciprofloxacin (CIPRO) 500 MG tablet Take 500 mg by mouth 2 (two) times daily.  . [DISCONTINUED] guaiFENesin (MUCINEX) 600 MG 12 hr tablet Take 600 mg by mouth in the morning and at bedtime.  . [DISCONTINUED] metroNIDAZOLE (FLAGYL) 500 MG tablet Take 500 mg by mouth 2 (two) times daily.  . [DISCONTINUED]  Multiple Vitamin (MULTIVITAMIN) tablet Take 1 tablet by mouth daily.     Allergies:   Terbinafine and related   Social History   Socioeconomic History  . Marital status: Married    Spouse name: Not on file  . Number of children: Not on file  . Years of education: Not on file  . Highest education level: Not on file  Occupational History  . Not on file  Tobacco Use  . Smoking status: Former Smoker    Packs/day: 1.00    Years: 70.00    Pack years: 70.00    Types: Cigarettes    Quit date: 04/2018    Years since quitting: 1.7  . Smokeless tobacco: Never Used  Vaping Use  . Vaping Use: Never used  Substance and Sexual Activity  . Alcohol use: No  . Drug use: No  . Sexual activity: Not on file  Other Topics Concern  .  Not on file  Social History Narrative  . Not on file   Social Determinants of Health   Financial Resource Strain:   . Difficulty of Paying Living Expenses: Not on file  Food Insecurity:   . Worried About Charity fundraiser in the Last Year: Not on file  . Ran Out of Food in the Last Year: Not on file  Transportation Needs:   . Lack of Transportation (Medical): Not on file  . Lack of Transportation (Non-Medical): Not on file  Physical Activity:   . Days of Exercise per Week: Not on file  . Minutes of Exercise per Session: Not on file  Stress:   . Feeling of Stress : Not on file  Social Connections:   . Frequency of Communication with Friends and Family: Not on file  . Frequency of Social Gatherings with Friends and Family: Not on file  . Attends Religious Services: Not on file  . Active Member of Clubs or Organizations: Not on file  . Attends Archivist Meetings: Not on file  . Marital Status: Not on file     Family History: The patient's family history includes Heart attack in his brother; Heart failure in his father. ROS:   Please see the history of present illness.    All 14 point review of systems negative except as described per  history of present illness  EKGs/Labs/Other Studies Reviewed:      Recent Labs: 06/14/2019: BUN 10; Creatinine, Ser 0.97; Hemoglobin 12.6; Platelets 272; Potassium 4.0; Sodium 140  Recent Lipid Panel No results found for: CHOL, TRIG, HDL, CHOLHDL, VLDL, LDLCALC, LDLDIRECT  Physical Exam:    VS:  BP 90/60 (BP Location: Left Arm, Patient Position: Sitting, Cuff Size: Normal)   Pulse 69   Ht 6\' 1"  (1.854 m)   Wt 184 lb (83.5 kg)   SpO2 93%   BMI 24.28 kg/m     Wt Readings from Last 3 Encounters:  01/10/20 184 lb (83.5 kg)  12/26/19 176 lb 3.2 oz (79.9 kg)  06/20/19 180 lb 12.4 oz (82 kg)     GEN:  Well nourished, well developed in no acute distress HEENT: Normal NECK: No JVD; No carotid bruits LYMPHATICS: No lymphadenopathy CARDIAC: Irregular, no murmurs, no rubs, no gallops RESPIRATORY:  Clear to auscultation without rales, wheezing or rhonchi  ABDOMEN: Soft, non-tender, non-distended MUSCULOSKELETAL:  No edema; No deformity  SKIN: Warm and dry LOWER EXTREMITIES: no swelling NEUROLOGIC:  Alert and oriented x 3 PSYCHIATRIC:  Normal affect   ASSESSMENT:    1. High risk medication use   2. Persistent atrial fibrillation ([place])   3. Nonsustained ventricular tachycardia (HCC)   4. Panlobular emphysema (HCC)    PLAN:    In order of problems listed above:  1. Paroxysmal atrial fibrillation now atrial flutter.  Anticoagulated for at least 4 weeks.  We did talk about options for the situation.  He want to pursue electrical cardioversion.  Procedure was explained to him again including all risk benefits as well as alternatives, in the meantime we will continue with amiodarone, however, I will reduce the dose from 200 mg twice daily to 200 mg once a day.  I stressed importance of taking anticoagulation on the regular basis, will discontinue his furosemide 20 that he does takes very rarely, he did not take any for last few weeks. 2. History of nonsustained ventricular tachycardia,  extensive work-up so far was unrevealing which included echocardiogram as well as stress testing. 3.  Recent history of GI bleed but he has been put back on anticoagulation and his H&H is holding, I will recheck his H&H. 4. History of emphysema: Stable. 5. We are planning for elective electrical cardioversion.  Routine lab work test will be done.   Medication Adjustments/Labs and Tests Ordered: Current medicines are reviewed at length with the patient today.  Concerns regarding medicines are outlined above.  No orders of the defined types were placed in this encounter.  Medication changes: No orders of the defined types were placed in this encounter.   Signed, Park Liter, MD, [facility] 01/10/2020 2:04 PM    [place]

## 2020-01-11 ENCOUNTER — Encounter: Payer: Self-pay | Admitting: Cardiology

## 2020-01-11 LAB — BASIC METABOLIC PANEL
BUN/Creatinine Ratio: 16 (ref 10–24)
BUN: 15 mg/dL (ref 8–27)
CO2: 20 mmol/L (ref 20–29)
Calcium: 9 mg/dL (ref 8.6–10.2)
Chloride: 104 mmol/L (ref 96–106)
Creatinine, Ser: 0.91 mg/dL (ref 0.76–1.27)
GFR calc Af Amer: 89 mL/min/{1.73_m2} (ref >59)
GFR calc non Af Amer: 77 mL/min/{1.73_m2} (ref >59)
Glucose: 124 mg/dL — ABNORMAL HIGH (ref 65–99)
Potassium: 4.4 mmol/L (ref 3.5–5.2)
Sodium: 139 mmol/L (ref 134–144)

## 2020-01-11 LAB — CBC
Hematocrit: 39 % (ref 37.5–51.0)
Hemoglobin: 13 g/dL (ref 13.0–17.7)
MCH: 31.4 pg (ref 26.6–33.0)
MCHC: 33.3 g/dL (ref 31.5–35.7)
MCV: 94 fL (ref 79–97)
Platelets: 239 10*3/uL (ref 150–450)
RBC: 4.14 x10E6/uL (ref 4.14–5.80)
RDW: 13.2 % (ref 11.6–15.4)
WBC: 8.7 10*3/uL (ref 3.4–10.8)

## 2020-01-11 NOTE — Telephone Encounter (Signed)
No note needed 

## 2020-01-15 ENCOUNTER — Telehealth: Payer: Self-pay | Admitting: Cardiology

## 2020-01-15 NOTE — Telephone Encounter (Signed)
Patient's wife states the patient is supposed to have a cardioversion, but they have not heard anything about scheduling.

## 2020-01-15 NOTE — Telephone Encounter (Signed)
Operating room at [facility] states they have not received the patient's covid test results.

## 2020-01-15 NOTE — Telephone Encounter (Signed)
Wayne Mcguire is aware that Sharyn Lull has 2 copies of the COVID test that was faxed this am.

## 2020-01-15 NOTE — Telephone Encounter (Signed)
Called patient wife per dpr informed her that patient is scheduled for cardioversion tomorrow at 645 am. She verbally understood. No further quesitons.

## 2020-01-16 ENCOUNTER — Telehealth: Payer: Self-pay | Admitting: Cardiology

## 2020-01-16 DIAGNOSIS — I4892 Unspecified atrial flutter: Secondary | ICD-10-CM

## 2020-01-16 HISTORY — PX: OTHER SURGICAL HISTORY: SHX169

## 2020-01-16 NOTE — Telephone Encounter (Signed)
Yes we will be able to double book him please

## 2020-01-16 NOTE — Telephone Encounter (Signed)
Patient's wife is calling to schedule patient for 1 week post cardioversion follow up with Dr. Agustin Cree. She states the patient is only able to go to the office in [place] and I made her aware that Dr. Wendy Poet soonest appointment available in [place] is on 03/19/19. She insists that patient needs to be seen sooner for follow up. Are we able to work patient in? Please advise.

## 2020-01-16 NOTE — Telephone Encounter (Signed)
Left message for patient to return call.

## 2020-01-21 NOTE — Telephone Encounter (Signed)
Spoke to patient wife. Scheduled patient for this week. No further questions.

## 2020-01-21 NOTE — Telephone Encounter (Signed)
Attempted to call back. Number busy. Will continue efforts.

## 2020-01-25 ENCOUNTER — Encounter: Payer: Self-pay | Admitting: Cardiology

## 2020-01-25 ENCOUNTER — Other Ambulatory Visit: Payer: Self-pay

## 2020-01-25 ENCOUNTER — Ambulatory Visit: Payer: Medicare HMO | Admitting: Cardiology

## 2020-01-25 VITALS — BP 84/60 | HR 51 | Ht 73.0 in | Wt 183.0 lb

## 2020-01-25 DIAGNOSIS — I480 Paroxysmal atrial fibrillation: Secondary | ICD-10-CM

## 2020-01-25 DIAGNOSIS — I4892 Unspecified atrial flutter: Secondary | ICD-10-CM

## 2020-01-25 DIAGNOSIS — I9589 Other hypotension: Secondary | ICD-10-CM

## 2020-01-25 DIAGNOSIS — E861 Hypovolemia: Secondary | ICD-10-CM

## 2020-01-25 DIAGNOSIS — I4819 Other persistent atrial fibrillation: Secondary | ICD-10-CM | POA: Diagnosis not present

## 2020-01-25 HISTORY — DX: Unspecified atrial flutter: I48.92

## 2020-01-25 NOTE — Progress Notes (Signed)
AMB R

## 2020-01-25 NOTE — Patient Instructions (Signed)

## 2020-01-25 NOTE — Progress Notes (Signed)
Cardiology Office Note:    Date:  01/25/2020   ID:  Wayne Mcguire, DOB 03-28-1932, MRN [id number]  PCP:  [name], MD  Cardiologist:  [name], MD    Referring MD: [name], MD   Chief Complaint  Patient presents with  . Postop follow  . Fatigue  . Shortness of Breath    History of Present Illness:    Wayne Mcguire is a 86 y.o. male past medical history significant for paroxysmal atrial fibrillation/flutter couple years ago he did have electrical cardioversion converted to sinus rhythm.  Also does have history of nonsustained ventricular tachycardia, recently he was admitted to hospital because of GI bleed temporarily his anticoagulation has been withdrawn, he did have colonoscopy he was find to have diverticulosis after that he was put back on anticoagulation he is being anticoagulated since then with normal H&H.  Recently he was sent to round of hospital for elective electrical cardioversion after he is being loaded on amiodarone.  He converted to sinus rhythm with 200 J he comes today to my office after that cardioversion.  Sadly he is back to atrial flutter with controlled ventricular rate.  He feels poorly he is feels weak he feels tired.  He said he never felt good after cardioversion.  Past Medical History:  Diagnosis Date  . Acute non-recurrent pansinusitis 04/18/2019  . 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 Adult situational stress disorder   . Anxiety   . Arthritis of left glenohumeral joint 06/20/2019  . Bladder cancer ([place])   . Bloody stool 11/27/2019  . Chest pain in adult 08/23/2016   Stress echo test done 09/05/15 Normal at 7 mets      . Chronic anticoagulation 11/15/2016  . Chronic anxiety 09/09/2016  . Chronic insomnia 09/09/2016  . Chronic pain of both knees 10/06/2017  . Chronic pain of both shoulders 09/14/2018  . Constipation 10/02/2018  . Costochondral chest pain 09/07/2016  . Cough with hemoptysis 01/06/2017  . Depression   . Diarrhea 10/02/2018    . Dysuria 01/06/2017  . Emphysema lung (HCC)    Mild  . Esophagitis   . GERD (gastroesophageal reflux disease)   . GERD without esophagitis 09/09/2016  . High risk medication use 08/23/2016   Flecanide  . Hospital discharge follow-up 12/24/2019  . Hypotension 03/10/2018  . Left lower quadrant abdominal pain 11/27/2019  . Low back pain 11/06/2018  . Lumbar herniated disc   . Lung nodule < 6cm on CT 09/30/2017  . Malaise and fatigue 09/30/2017  . Medicare annual wellness visit, subsequent 09/30/2017  . Nonsustained ventricular tachycardia ([place])   . Orchitis 11/06/2018  . PAF (paroxysmal atrial fibrillation) ([place]) 08/23/2016   CHADS2 vasc=2, he has elected not to accept anticoagulation  . Penile bleeding 10/02/2018  . Penile discharge 11/06/2018  . Peripheral edema 11/06/2018  . Persistent atrial fibrillation ([place]) 01/10/2020  . 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 Productive cough 11/06/2018  . Seasonal allergies   . Trochanteric bursitis of left hip 10/06/2017  . Unsteady gait 04/03/2018  . Ventricular tachycardia ([place]) 03/06/2019  . Vertigo   . Vitamin D deficiency 09/30/2017    Past Surgical History:  Procedure Laterality Date  . BLADDER SURGERY    . COLONOSCOPY    . Synchronous Cardioversion  01/16/2020  . TOTAL SHOULDER ARTHROPLASTY Left 06/20/2019   Procedure: TOTAL SHOULDER ARTHROPLASTY;  Surgeon: Hiram Gash, MD;  Location: [facility];  Service: Orthopedics;  Laterality: Left;  Marland Kitchen VEIN  SURGERY Left     Current Medications: Current Meds  Medication Sig  . acetaminophen (TYLENOL) 500 MG tablet Take 500 mg by mouth in the morning and at bedtime.  Marland Kitchen amiodarone (PACERONE) 200 MG tablet Take 1 tablet (200 mg total) by mouth daily.  Marland Kitchen apixaban (ELIQUIS) 5 MG TABS tablet Take 1 tablet (5 mg total) by mouth 2 (two) times daily.  . Cholecalciferol (VITAMIN D-3) 125 MCG (5000 UT) TABS Take 1 tablet by mouth daily.  Marland Kitchen CRANBERRY PO Take 1 tablet by mouth daily.  Marland Kitchen  Dextromethorphan-guaiFENesin (MUCINEX DM PO) Take 1 tablet by mouth 2 (two) times daily.  . fluticasone (FLONASE) 50 MCG/ACT nasal spray Place 1 spray into both nostrils daily.   . furosemide (LASIX) 20 MG tablet Take 20 mg by mouth as needed.  . magnesium oxide (MAG-OX) 400 MG tablet Take 400 mg by mouth daily.  . Multiple Vitamin (MULTIVITAMIN ADULT PO) Take by mouth in the morning and at bedtime.  . Multiple Vitamins-Minerals (HAIR SKIN AND NAILS FORMULA) TABS Take 1 tablet by mouth daily.  . Multiple Vitamins-Minerals (ICAPS AREDS 2 PO) Take 2 capsules by mouth daily.   . nitroGLYCERIN (NITROSTAT) 0.4 MG SL tablet Place 1 tablet (0.4 mg total) under the tongue every 5 (five) minutes as needed for chest pain.  Marland Kitchen olopatadine (PATADAY) 0.1 % ophthalmic solution Place 1 drop into both eyes daily as needed for allergies.  Marland Kitchen omeprazole (PRILOSEC) 20 MG capsule Take 20 mg by mouth daily.  Marland Kitchen Peppermint Oil (IBGARD) 90 MG CPCR Take 2 capsules by mouth daily.  . Probiotic Product (PROBIOTIC DAILY) CAPS Take 1 capsule by mouth daily.  . traMADol (ULTRAM) 50 MG tablet Take 50 mg by mouth as needed.   . traZODone (DESYREL) 150 MG tablet Take 150 mg by mouth at bedtime.      Allergies:   Terbinafine and related   Social History   Socioeconomic History  . Marital status: Married    Spouse name: Not on file  . Number of children: Not on file  . Years of education: Not on file  . Highest education level: Not on file  Occupational History  . Not on file  Tobacco Use  . Smoking status: Former Smoker    Packs/day: 1.00    Years: 70.00    Pack years: 70.00    Types: Cigarettes    Quit date: 04/2018    Years since quitting: 1.7  . Smokeless tobacco: Never Used  Vaping Use  . Vaping Use: Never used  Substance and Sexual Activity  . Alcohol use: No  . Drug use: No  . Sexual activity: Not on file  Other Topics Concern  . Not on file  Social History Narrative  . Not on file   Social  Determinants of Health   Financial Resource Strain:   . Difficulty of Paying Living Expenses: Not on file  Food Insecurity:   . Worried About Charity fundraiser in the Last Year: Not on file  . Ran Out of Food in the Last Year: Not on file  Transportation Needs:   . Lack of Transportation (Medical): Not on file  . Lack of Transportation (Non-Medical): Not on file  Physical Activity:   . Days of Exercise per Week: Not on file  . Minutes of Exercise per Session: Not on file  Stress:   . Feeling of Stress : Not on file  Social Connections:   . Frequency of Communication with Friends  and Family: Not on file  . Frequency of Social Gatherings with Friends and Family: Not on file  . Attends Religious Services: Not on file  . Active Member of Clubs or Organizations: Not on file  . Attends Archivist Meetings: Not on file  . Marital Status: Not on file     Family History: The patient's family history includes Heart attack in his brother; Heart failure in his father. ROS:   Please see the history of present illness.    All 14 point review of systems negative except as described per history of present illness  EKGs/Labs/Other Studies Reviewed:      Recent Labs: 01/10/2020: BUN 15; Creatinine, Ser 0.91; Hemoglobin 13.0; Platelets 239; Potassium 4.4; Sodium 139  Recent Lipid Panel No results found for: CHOL, TRIG, HDL, CHOLHDL, VLDL, LDLCALC, LDLDIRECT  Physical Exam:    VS:  BP (!) 84/60 (BP Location: Right Arm, Patient Position: Sitting)   Pulse (!) 51   Ht 6\' 1"  (1.854 m)   Wt 183 lb (83 kg)   SpO2 94%   BMI 24.14 kg/m     Wt Readings from Last 3 Encounters:  01/25/20 183 lb (83 kg)  01/10/20 184 lb (83.5 kg)  12/26/19 176 lb 3.2 oz (79.9 kg)     GEN:  Well nourished, well developed in no acute distress HEENT: Normal NECK: No JVD; No carotid bruits LYMPHATICS: No lymphadenopathy CARDIAC: RRR, no murmurs, no rubs, no gallops RESPIRATORY:  Clear to  auscultation without rales, wheezing or rhonchi  ABDOMEN: Soft, non-tender, non-distended MUSCULOSKELETAL:  No edema; No deformity  SKIN: Warm and dry LOWER EXTREMITIES: no swelling NEUROLOGIC:  Alert and oriented x 3 PSYCHIATRIC:  Normal affect   ASSESSMENT:    1. Persistent atrial fibrillation (HCC)   2. Paroxysmal atrial flutter ([place])   3. PAF (paroxysmal atrial fibrillation) ([place])   4. Hypotension due to hypovolemia    PLAN:    In order of problems listed above:  1. Paroxysmal atrial flutter and now persistent atrial flutter.  Interestingly he was converted from atrial flutter to sinus rhythm and now he is back to atrial flutter.  He is on amiodarone which I will continue, I will refer him to EP team for consideration of atrial flutter ablation. 2. Paroxysmal atrial fibrillation now he is in flutter does not have any it recent episode of atrial fibrillation. 3. Hypertension.  He is does not take any medication that does it.  I encouraged him to drink plenty of fluids. 4. History of anxiety and depression followed by internal medicine team. 5. History of ventricle tachycardia, stress test done in February 01, 2019 was negative, echocardiogram done in January 21 showed normal left ventricle ejection fraction   Medication Adjustments/Labs and Tests Ordered: Current medicines are reviewed at length with the patient today.  Concerns regarding medicines are outlined above.  Orders Placed This Encounter  Procedures  . Ambulatory referral to Cardiac Electrophysiology  . EKG 12-Lead   Medication changes: No orders of the defined types were placed in this encounter.   Signed, Park Liter, MD, [facility] 01/25/2020 10:48 AM    [place]

## 2020-01-28 ENCOUNTER — Telehealth: Payer: Self-pay | Admitting: Physician Assistant

## 2020-01-28 NOTE — Telephone Encounter (Signed)
Patient with recent failed cardioversion and not feeling well for past 1 weeks. Seen by Dr. Agustin Cree 01/25/20. Plan to continue amiodarone and EP referreral for ablation.   Advised to continue amiodarone and Eliquis. No bleeding issue. Declined to go to ER.  Wife will call Dr. Joycelyn Rua office in AM. Recommended to call EMS if worsen symptoms for at least elevation.

## 2020-02-18 ENCOUNTER — Other Ambulatory Visit: Payer: Self-pay

## 2020-02-18 ENCOUNTER — Encounter: Payer: Self-pay | Admitting: Cardiology

## 2020-02-18 ENCOUNTER — Ambulatory Visit: Payer: Medicare HMO | Admitting: Cardiology

## 2020-02-18 VITALS — BP 90/60 | HR 81 | Ht 73.0 in | Wt 182.2 lb

## 2020-02-18 DIAGNOSIS — Z01812 Encounter for preprocedural laboratory examination: Secondary | ICD-10-CM

## 2020-02-18 DIAGNOSIS — I480 Paroxysmal atrial fibrillation: Secondary | ICD-10-CM | POA: Diagnosis not present

## 2020-02-18 MED ORDER — APIXABAN 5 MG PO TABS: 5.0000 mg | ORAL_TABLET | Freq: Two times a day (BID) | ORAL | 3 refills | Status: DC

## 2020-02-18 NOTE — Patient Instructions (Signed)
Medication Instructions:  Your physician recommends that you continue on your current medications as directed. Please refer to the Current Medication list given to you today.  *If you need a refill on your cardiac medications before your next appointment, please call your pharmacy*   Lab Work: See procedure instructions below  If you have labs (blood work) drawn today and your tests are completely normal, you will receive your results only by: Marland Kitchen MyChart Message (if you have MyChart) OR . A paper copy in the mail If you have any lab test that is abnormal or we need to change your treatment, we will call you to review the results.   Testing/Procedures: Your physician has recommended that you have an ablation. Catheter ablation is a medical procedure used to treat some cardiac arrhythmias (irregular heartbeats). During catheter ablation, a long, thin, flexible tube is put into a blood vessel in your groin (upper thigh), or neck. This tube is called an ablation catheter. It is then guided to your heart through the blood vessel. Radio frequency waves destroy small areas of heart tissue where abnormal heartbeats may cause an arrhythmia to start. Please see the instructions below located under "other instructions".    Follow-Up: At [facility], you and your health needs are our priority.  As part of our continuing mission to provide you with exceptional heart care, we have created designated Provider Care Teams.  These Care Teams include your primary Cardiologist (physician) and Advanced Practice Providers (APPs -  Physician Assistants and Nurse Practitioners) who all work together to provide you with the care you need, when you need it.  We recommend signing up for the patient portal called "MyChart".  Sign up information is provided on this After Visit Summary.  MyChart is used to connect with patients for Virtual Visits (Telemedicine).  Patients are able to view lab/test results, encounter notes,  upcoming appointments, etc.  Non-urgent messages can be sent to your provider as well.   To learn more about what you can do with MyChart, go to [email or web address].    Your next appointment:   4 week(s) after your ablation on 04/03/20  The format for your next appointment:   In Person  Provider:   Loman Brooklyn, MD    Thank you for choosing [facility]!!   Dory Horn, RN [phone]   Other Instructions    Electrophysiology/Ablation Procedure Instructions   You are scheduled for a(n)  ablation on 04/03/20 with Dr. Loman Brooklyn.   1.   Pre procedure testing-             A.  LAB WORK --- between 03/10/20 - 03/28/20  for your pre procedure blood work.  You do NOT need to be fasting.  You can stop by the [place] office anytime during business hours (avoid lunch time hours).               B. COVID TEST-- On 04/01/20 @ 12:00 pm - This is a Drive Up Visit at [address] [place], [place] [address].  Someone will direct you to the appropriate testing line. Stay in your car and someone will be with you shortly.   After you are tested please go home and self quarantine until the day of your procedure.     2. On the day of your procedure 04/03/20 you will go to [facility] [phone]) at 8:30 am.  Bonita Quin will go to the main entrance [employer]) and  enter where the [place] parking staff are.  Your driver will drop you off and you will head down the hallway to ADMITTING.  You may have one support person come in to the hospital with you.  They will be asked to wait in the waiting room.   3.   Do not eat or drink after midnight prior to your procedure.   4.   Do not miss any doses of your blood thinner prior to the morning of your procedure or your procedure will need to be rescheduled.       Do NOT take any medications the morning of your procedure.   5.  Plan for an overnight stay, but you may be discharged home after your procedure.    If you use your phone  frequently bring your phone charger, in case you have to stay.  If you are discharged after your procedure you will need someone to drive you home and be with your for 24 hours after your procedure.   6.  You will follow up with Dr. Curt Bears  1 month after your procedure.  This appointment will be made for you.   * If you have ANY questions please call the office [phone] and ask for Shulem Mader RN or send me a MyChart message   * Occasionally, EP Studies and ablations can become lengthy.  Please make your family aware of this before your procedure starts.  Average time ranges from 2-8 hours for EP studies/ablations.  Your physician will call your family after the procedure with the results.                                     Cardiac Ablation  Cardiac ablation is a procedure to stop some heart tissue from causing problems. The heart has many electrical connections. Sometimes these connections make the heart beat very fast or irregularly. Removing some problem areas can improve the heart rhythm or make it normal. What happens before the procedure?  Follow instructions from your doctor about what you cannot eat or drink.  Ask your doctor about: ? Changing or stopping your normal medicines. This is important if you take diabetes medicines or blood thinners. ? Taking medicines such as aspirin and ibuprofen. These medicines can thin your blood. Do not take these medicines before your procedure if your doctor tells you not to.  Plan to have someone take you home.  If you will be going home right after the procedure, plan to have someone with you for 24 hours. What happens during the procedure?  To lower your risk of infection: ? Your health care team will wash or sanitize their hands. ? Your skin will be washed with soap. ? Hair may be removed from your neck or groin.  An IV tube will be put into one of your veins.  You will be given a medicine to help you relax (sedative).  Skin on  your neck or groin will be numbed.  A cut (incision) will be made in your neck or groin.  A needle will be put through your cut and into a vein in your neck or groin.  A tube (catheter) will be put into the needle. The tube will be moved to your heart. X-rays (fluoroscopy) will be used to help guide the tube.  Small devices (electrodes) on the tip of the tube will send out electrical currents.  Dye  may be put through the tube. This helps your surgeon see your heart.  Electrical energy will be used to scar (ablate) some heart tissue. Your surgeon may use: ? Heat (radiofrequency energy). ? Laser energy. ? Extreme cold (cryoablation).  The tube will be taken out.  Pressure will be held on your cut. This helps stop bleeding.  A bandage (dressing) will be put on your cut. The procedure may vary. What happens after the procedure?  You will be monitored until your medicines have worn off.  Your cut will be watched for bleeding. You will need to lie still for a few hours.  Do not drive for 24 hours or as long as your doctor tells you. Summary  Cardiac ablation is a procedure to stop some heart tissue from causing problems.  Electrical energy will be used to scar (ablate) some heart tissue. This information is not intended to replace advice given to you by your health care provider. Make sure you discuss any questions you have with your health care provider. Document Revised: 01/21/2017 Document Reviewed: 12/29/2015 Elsevier Patient Education  2020 [employer].

## 2020-02-18 NOTE — Progress Notes (Signed)
Electrophysiology Office Note   Date:  02/18/2020   ID:  Wayne Mcguire, DOB 05-Apr-1932, MRN [id number]  PCP:  [name], MD  Cardiologist:  Agustin Cree Primary Electrophysiologist:  Krishav Mamone [place], MD    Chief Complaint: AF/flutter   History of Present Illness: Wayne Mcguire is a 86 y.o. male who is being seen today for the evaluation of AF/flutter at the request of Park Liter, MD. Presenting today for electrophysiology evaluation.  He has a history significant for paroxysmal atrial fibrillation/flutter status post cardioversion years ago.  He has also a history of nonsustained VT.  He was admitted to the hospital with a GI bleed.  He had a colonoscopy and was found to have diverticulosis.  He was loaded on amiodarone and was sent back to the hospital for cardioversion.  He presented back in atrial flutter.    Today, he denies symptoms of palpitations, chest pain, shortness of breath, orthopnea, PND, lower extremity edema, claudication, dizziness, presyncope, syncope, bleeding, or neurologic sequela. The patient is tolerating medications without difficulties.  After his cardioversion he felt well for a few days.  He had no fatigue or shortness of breath.  2 to 3 days later, his fatigue and shortness of breath came back.  He also has significant weakness and is unable to do all of his daily activities.  He is usually quite active.  Past Medical History:  Diagnosis Date  . Acute non-recurrent pansinusitis 04/18/2019  . 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 Adult situational stress disorder   . Anxiety   . Arthritis of left glenohumeral joint 06/20/2019  . Bladder cancer ([place])   . Bloody stool 11/27/2019  . Chest pain in adult 08/23/2016   Stress echo test done 09/05/15 Normal at 7 mets      . Chronic anticoagulation 11/15/2016  . Chronic anxiety 09/09/2016  . Chronic insomnia 09/09/2016  . Chronic pain of both knees 10/06/2017  . Chronic pain of both shoulders 09/14/2018  .  Constipation 10/02/2018  . Costochondral chest pain 09/07/2016  . Cough with hemoptysis 01/06/2017  . Depression   . Diarrhea 10/02/2018  . Dysuria 01/06/2017  . Emphysema lung (HCC)    Mild  . Esophagitis   . GERD (gastroesophageal reflux disease)   . GERD without esophagitis 09/09/2016  . High risk medication use 08/23/2016   Flecanide  . Hospital discharge follow-up 12/24/2019  . Hypotension 03/10/2018  . Left lower quadrant abdominal pain 11/27/2019  . Low back pain 11/06/2018  . Lumbar herniated disc   . Lung nodule < 6cm on CT 09/30/2017  . Malaise and fatigue 09/30/2017  . Medicare annual wellness visit, subsequent 09/30/2017  . Nonsustained ventricular tachycardia ([place])   . Orchitis 11/06/2018  . PAF (paroxysmal atrial fibrillation) ([place]) 08/23/2016   CHADS2 vasc=2, he has elected not to accept anticoagulation  . Penile bleeding 10/02/2018  . Penile discharge 11/06/2018  . Peripheral edema 11/06/2018  . Persistent atrial fibrillation ([place]) 01/10/2020  . 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 Productive cough 11/06/2018  . Seasonal allergies   . Trochanteric bursitis of left hip 10/06/2017  . Unsteady gait 04/03/2018  . Ventricular tachycardia ([place]) 03/06/2019  . Vertigo   . Vitamin D deficiency 09/30/2017   Past Surgical History:  Procedure Laterality Date  . BLADDER SURGERY    . COLONOSCOPY    . Synchronous Cardioversion  01/16/2020  . TOTAL SHOULDER ARTHROPLASTY Left 06/20/2019   Procedure: TOTAL SHOULDER ARTHROPLASTY;  Surgeon: Ophelia Charter  T, MD;  Location: [facility] ORS;  Service: Orthopedics;  Laterality: Left;  Marland Kitchen VEIN SURGERY Left      Current Outpatient Medications  Medication Sig Dispense Refill  . acetaminophen (TYLENOL) 500 MG tablet Take 500 mg by mouth in the morning and at bedtime.    Marland Kitchen amiodarone (PACERONE) 200 MG tablet Take 1 tablet (200 mg total) by mouth daily. 90 tablet 3  . Cholecalciferol (VITAMIN D-3) 125 MCG (5000 UT) TABS Take 1 tablet by mouth  daily.    Marland Kitchen CRANBERRY PO Take 1 tablet by mouth daily.    Marland Kitchen Dextromethorphan-guaiFENesin (MUCINEX DM PO) Take 1 tablet by mouth 2 (two) times daily.    . fluticasone (FLONASE) 50 MCG/ACT nasal spray Place 1 spray into both nostrils daily.     . furosemide (LASIX) 20 MG tablet Take 20 mg by mouth as needed.    . magnesium oxide (MAG-OX) 400 MG tablet Take 400 mg by mouth daily.    . Multiple Vitamin (MULTIVITAMIN ADULT PO) Take by mouth in the morning and at bedtime.    . Multiple Vitamins-Minerals (HAIR SKIN AND NAILS FORMULA) TABS Take 1 tablet by mouth daily.    . Multiple Vitamins-Minerals (ICAPS AREDS 2 PO) Take 2 capsules by mouth daily.     . nitroGLYCERIN (NITROSTAT) 0.4 MG SL tablet Place 1 tablet (0.4 mg total) under the tongue every 5 (five) minutes as needed for chest pain. 25 tablet 5  . olopatadine (PATANOL) 0.1 % ophthalmic solution Place 1 drop into both eyes daily as needed for allergies.    Marland Kitchen omeprazole (PRILOSEC) 20 MG capsule Take 20 mg by mouth daily.    Marland Kitchen OVER THE COUNTER MEDICATION Take 1 capsule by mouth in the morning and at bedtime. IB Gard    . Peppermint Oil (IBGARD) 90 MG CPCR Take 2 capsules by mouth daily.    . Probiotic Product (PROBIOTIC DAILY) CAPS Take 1 capsule by mouth daily.    . traMADol (ULTRAM) 50 MG tablet Take 50 mg by mouth as needed.     . traZODone (DESYREL) 150 MG tablet Take 150 mg by mouth at bedtime.     Marland Kitchen apixaban (ELIQUIS) 5 MG TABS tablet Take 1 tablet (5 mg total) by mouth 2 (two) times daily. 180 tablet 3   No current facility-administered medications for this visit.    Allergies:   Terbinafine and related   Social History:  The patient  reports that he quit smoking about 21 months ago. His smoking use included cigarettes. He has a 70.00 pack-year smoking history. He has never used smokeless tobacco. He reports that he does not drink alcohol and does not use drugs.   Family History:  The patient's family history includes Heart attack in  his brother; Heart failure in his father.    ROS:  Please see the history of present illness.   Otherwise, review of systems is positive for none.   All other systems are reviewed and negative.    PHYSICAL EXAM: VS:  BP 90/60   Pulse 81   Ht 6\' 1"  (1.854 m)   Wt 182 lb 3.2 oz (82.6 kg)   SpO2 97%   BMI 24.04 kg/m  , BMI Body mass index is 24.04 kg/m. GEN: Well nourished, well developed, in no acute distress  HEENT: normal  Neck: no JVD, carotid bruits, or masses Cardiac: RRR; no murmurs, rubs, or gallops,no edema  Respiratory:  clear to auscultation bilaterally, normal work of breathing GI: soft,  nontender, nondistended, + BS MS: no deformity or atrophy  Skin: warm and dry Neuro:  Strength and sensation are intact Psych: euthymic mood, full affect  EKG:  EKG is ordered today. Personal review of the ekg ordered shows atrial flutter, rate 81  Recent Labs: 01/10/2020: BUN 15; Creatinine, Ser 0.91; Hemoglobin 13.0; Platelets 239; Potassium 4.4; Sodium 139    Lipid Panel  No results found for: CHOL, TRIG, HDL, CHOLHDL, VLDL, LDLCALC, LDLDIRECT   Wt Readings from Last 3 Encounters:  02/18/20 182 lb 3.2 oz (82.6 kg)  01/25/20 183 lb (83 kg)  01/10/20 184 lb (83.5 kg)      Other studies Reviewed: Additional studies/ records that were reviewed today include: TTE 03/07/19  Review of the above records today demonstrates:  1. Left ventricular ejection fraction, by visual estimation, is 60 to  65%. The left ventricle has normal function. Left ventricular septal wall  thickness was mildly increased. Mildly increased left ventricular  posterior wall thickness. There is mildly  increased left ventricular hypertrophy.  2. Left ventricular diastolic parameters are consistent with Grade I  diastolic dysfunction (impaired relaxation).  3. The left ventricle has no regional wall motion abnormalities.  4. Global right ventricle has normal systolic function.The right  ventricular  size is mildly enlarged. Mildly increased right ventricular  wall thickness.  5. Left atrial size was normal.  6. Right atrial size was normal.  7. Mild mitral annular calcification.  8. The mitral valve is normal in structure. Mild mitral valve  regurgitation. No evidence of mitral stenosis.  9. The tricuspid valve is normal in structure.  10. The aortic valve is tricuspid. Aortic valve regurgitation is mild.  Mild aortic valve stenosis.  11. The pulmonic valve was normal in structure. Pulmonic valve  regurgitation is not visualized.  12. There is mild dilatation of the ascending aorta.  13. Mildly elevated pulmonary artery systolic pressure.  14. The inferior vena cava is dilated in size with >50% respiratory  variability, suggesting right atrial pressure of 8 mmHg.   Cardiac monitor 03/04/2019 personally reviewed Multiple episode of narrow complex tachycardia. 2 runs of ventricular tachycardia with longest and fastest episodes 8 beats at rate of 176 bpm, asymptomatic. Symptomatic PVCs and PACs which were infrequent    ASSESSMENT AND PLAN:  1.  Persistent atrial fibrillation/typical atrial flutter: Is currently on amiodarone and Eliquis.  CHA2DS2-VASc of 2.  He is unfortunately gone back into atrial flutter after his cardioversion.  He has not had any further atrial fibrillation after his cardioversion.  He has had atrial flutter.  Due to his age, we Emiline Mancebo hold off on AF ablation.  We Derica Leiber plan for atrial flutter ablation.  Risks and benefits of been discussed which were bleeding, tamponade, heart block, stroke, among others.  He understands these risks and is agreed to the procedure.  2.  Ventricular tachycardia: Stress test December 2020 which was negative.  Echo January 2021 showed a normal ejection fraction.  No further work-up.  Case discussed with primary cardiology  Current medicines are reviewed at length with the patient today.   The patient does not have concerns  regarding his medicines.  The following changes were made today:  none  Labs/ tests ordered today include:  Orders Placed This Encounter  Procedures  . Basic metabolic panel  . CBC  . EKG 12-Lead     Disposition:   FU with Breyonna Nault 3 months  Signed, Diandra Cimini M[place] Leeds, MD  02/18/2020 12:09 PM  [place] [address] [phone] (office) [phone] (fax)

## 2020-02-20 ENCOUNTER — Encounter: Payer: Self-pay | Admitting: *Deleted

## 2020-02-20 DIAGNOSIS — I480 Paroxysmal atrial fibrillation: Secondary | ICD-10-CM

## 2020-02-20 NOTE — Progress Notes (Signed)
This encounter was created in error - please disregard.

## 2020-02-27 ENCOUNTER — Ambulatory Visit: Payer: Medicare HMO | Admitting: Cardiology

## 2020-03-01 ENCOUNTER — Telehealth: Payer: Self-pay | Admitting: Cardiology

## 2020-03-01 ENCOUNTER — Encounter: Payer: Self-pay | Admitting: Cardiology

## 2020-03-01 NOTE — Telephone Encounter (Signed)
Pt's wife called.  Pt reported to her that his heart was shocked in the night that woke him from sleep.  He was able to go back to sleep.  Today no chest pain or SOB.  BP is 90/60 his usual and HR 100.  I explained I was not sure of what caused this, he has no device.  If it returns or he feels bad to go to ER.  I will send to Dr. Curt Bears  -they would like ablation moved up if possible and I will send to Dr. Agustin Cree.

## 2020-03-03 NOTE — Telephone Encounter (Signed)
Well, I do not know what to make Out of this.  We have no device

## 2020-03-04 ENCOUNTER — Ambulatory Visit: Payer: Medicare HMO | Admitting: Cardiology

## 2020-03-13 LAB — CBC
Hematocrit: 42.6 % (ref 37.5–51.0)
Hemoglobin: 14.4 g/dL (ref 13.0–17.7)
MCH: 31.4 pg (ref 26.6–33.0)
MCHC: 33.8 g/dL (ref 31.5–35.7)
MCV: 93 fL (ref 79–97)
Platelets: 226 10*3/uL (ref 150–450)
RBC: 4.58 x10E6/uL (ref 4.14–5.80)
RDW: 12.3 % (ref 11.6–15.4)
WBC: 10.3 10*3/uL (ref 3.4–10.8)

## 2020-03-13 LAB — BASIC METABOLIC PANEL
BUN/Creatinine Ratio: 12 (ref 10–24)
BUN: 14 mg/dL (ref 8–27)
CO2: 23 mmol/L (ref 20–29)
Calcium: 9.7 mg/dL (ref 8.6–10.2)
Chloride: 101 mmol/L (ref 96–106)
Creatinine, Ser: 1.13 mg/dL (ref 0.76–1.27)
GFR calc Af Amer: 68 mL/min/{1.73_m2} (ref >59)
GFR calc non Af Amer: 59 mL/min/{1.73_m2} — ABNORMAL LOW (ref >59)
Glucose: 98 mg/dL (ref 65–99)
Potassium: 4.9 mmol/L (ref 3.5–5.2)
Sodium: 140 mmol/L (ref 134–144)

## 2020-03-17 ENCOUNTER — Encounter: Payer: Self-pay | Admitting: *Deleted

## 2020-03-18 DIAGNOSIS — R319 Hematuria, unspecified: Secondary | ICD-10-CM | POA: Insufficient documentation

## 2020-03-18 HISTORY — DX: Hematuria, unspecified: R31.9

## 2020-04-01 ENCOUNTER — Other Ambulatory Visit (HOSPITAL_COMMUNITY)
Admission: RE | Admit: 2020-04-01 | Discharge: 2020-04-01 | Disposition: A | Discharge status: Home / Self Care | Payer: Medicare HMO | Source: Ambulatory Visit | Attending: Cardiology | Admitting: Cardiology

## 2020-04-01 DIAGNOSIS — Z01812 Encounter for preprocedural laboratory examination: Secondary | ICD-10-CM | POA: Diagnosis present

## 2020-04-01 DIAGNOSIS — Z20822 Contact with and (suspected) exposure to covid-19: Secondary | ICD-10-CM | POA: Diagnosis not present

## 2020-04-01 LAB — SARS CORONAVIRUS 2 (TAT 6-24 HRS): SARS Coronavirus 2: NEGATIVE

## 2020-04-02 NOTE — Progress Notes (Signed)
Attempted to call patient regarding procedure instructions for tomorrow.  The phone was busy for over 30 minutes.

## 2020-04-03 ENCOUNTER — Ambulatory Visit (HOSPITAL_COMMUNITY)
Admission: RE | Admit: 2020-04-03 | Discharge: 2020-04-03 | Disposition: A | Discharge status: Home / Self Care | Payer: Medicare HMO | Attending: Cardiology | Admitting: Cardiology

## 2020-04-03 ENCOUNTER — Encounter (HOSPITAL_COMMUNITY)
Admission: RE | Disposition: A | Discharge status: Home / Self Care | Payer: Self-pay | Source: Home / Self Care | Attending: Cardiology

## 2020-04-03 ENCOUNTER — Encounter (HOSPITAL_COMMUNITY): Payer: Self-pay | Admitting: Cardiology

## 2020-04-03 ENCOUNTER — Ambulatory Visit (HOSPITAL_COMMUNITY): Payer: Medicare HMO | Admitting: Certified Registered Nurse Anesthetist

## 2020-04-03 ENCOUNTER — Other Ambulatory Visit: Payer: Self-pay

## 2020-04-03 DIAGNOSIS — Z7901 Long term (current) use of anticoagulants: Secondary | ICD-10-CM | POA: Insufficient documentation

## 2020-04-03 DIAGNOSIS — I483 Typical atrial flutter: Secondary | ICD-10-CM | POA: Diagnosis not present

## 2020-04-03 DIAGNOSIS — Z8249 Family history of ischemic heart disease and other diseases of the circulatory system: Secondary | ICD-10-CM | POA: Diagnosis not present

## 2020-04-03 DIAGNOSIS — Z8551 Personal history of malignant neoplasm of bladder: Secondary | ICD-10-CM | POA: Insufficient documentation

## 2020-04-03 DIAGNOSIS — I4819 Other persistent atrial fibrillation: Secondary | ICD-10-CM | POA: Diagnosis not present

## 2020-04-03 DIAGNOSIS — J439 Emphysema, unspecified: Secondary | ICD-10-CM | POA: Insufficient documentation

## 2020-04-03 DIAGNOSIS — I4892 Unspecified atrial flutter: Secondary | ICD-10-CM | POA: Diagnosis not present

## 2020-04-03 DIAGNOSIS — Z87891 Personal history of nicotine dependence: Secondary | ICD-10-CM | POA: Diagnosis not present

## 2020-04-03 HISTORY — PX: A-FLUTTER ABLATION: EP1230

## 2020-04-03 SURGERY — A-FLUTTER ABLATION
Anesthesia: General

## 2020-04-03 MED ORDER — PROPOFOL 10 MG/ML IV BOLUS
INTRAVENOUS | Status: DC | PRN
  Administered 2020-04-03: 150 mg via INTRAVENOUS
  Administered 2020-04-03: 50 mg via INTRAVENOUS

## 2020-04-03 MED ORDER — ONDANSETRON HCL 4 MG/2ML IJ SOLN: 4.0000 mg | Freq: Four times a day (QID) | INTRAMUSCULAR | Status: DC | PRN

## 2020-04-03 MED ORDER — PHENYLEPHRINE HCL-NACL 10-0.9 MG/250ML-% IV SOLN
INTRAVENOUS | Status: DC | PRN
  Administered 2020-04-03: 25 ug/min via INTRAVENOUS

## 2020-04-03 MED ORDER — DEXAMETHASONE SODIUM PHOSPHATE 10 MG/ML IJ SOLN
INTRAMUSCULAR | Status: DC | PRN
  Administered 2020-04-03: 10 mg via INTRAVENOUS

## 2020-04-03 MED ORDER — HEPARIN (PORCINE) IN NACL 2000-0.9 UNIT/L-% IV SOLN: INTRAVENOUS | Status: DC | PRN

## 2020-04-03 MED ORDER — ROCURONIUM BROMIDE 10 MG/ML (PF) SYRINGE
PREFILLED_SYRINGE | INTRAVENOUS | Status: DC | PRN
  Administered 2020-04-03: 50 mg via INTRAVENOUS

## 2020-04-03 MED ORDER — SODIUM CHLORIDE 0.9 % IV SOLN: INTRAVENOUS | Status: DC

## 2020-04-03 MED ORDER — ONDANSETRON HCL 4 MG/2ML IJ SOLN
INTRAMUSCULAR | Status: DC | PRN
  Administered 2020-04-03: 4 mg via INTRAVENOUS

## 2020-04-03 MED ORDER — FENTANYL CITRATE (PF) 100 MCG/2ML IJ SOLN
INTRAMUSCULAR | Status: DC | PRN
  Administered 2020-04-03: 100 ug via INTRAVENOUS

## 2020-04-03 MED ORDER — LIDOCAINE 2% (20 MG/ML) 5 ML SYRINGE
INTRAMUSCULAR | Status: DC | PRN
  Administered 2020-04-03: 60 mg via INTRAVENOUS

## 2020-04-03 MED ORDER — HEPARIN (PORCINE) IN NACL 1000-0.9 UT/500ML-% IV SOLN
INTRAVENOUS | Status: DC | PRN
  Administered 2020-04-03 (×2): 500 mL

## 2020-04-03 MED ORDER — SUGAMMADEX SODIUM 200 MG/2ML IV SOLN
INTRAVENOUS | Status: DC | PRN
  Administered 2020-04-03: 200 mg via INTRAVENOUS

## 2020-04-03 MED ORDER — PHENYLEPHRINE 40 MCG/ML (10ML) SYRINGE FOR IV PUSH (FOR BLOOD PRESSURE SUPPORT)
PREFILLED_SYRINGE | INTRAVENOUS | Status: DC | PRN
  Administered 2020-04-03 (×3): 80 ug via INTRAVENOUS

## 2020-04-03 SURGICAL SUPPLY — 12 items
BAG SNAP BAND KOVER 36X36 (MISCELLANEOUS) ×2 IMPLANT
CATH EZ STEER NAV 8MM F-J CUR (ABLATOR) ×2 IMPLANT
CATH WEBSTER BI DIR CS D-F CRV (CATHETERS) ×2 IMPLANT
CLOSURE PERCLOSE PROSTYLE (VASCULAR PRODUCTS) ×4 IMPLANT
PACK EP LATEX FREE (CUSTOM PROCEDURE TRAY) ×2
PACK EP LF (CUSTOM PROCEDURE TRAY) ×1 IMPLANT
PAD PRO RADIOLUCENT 2001M-C (PAD) ×2 IMPLANT
PATCH CARTO3 (PAD) ×2 IMPLANT
SHEATH PINNACLE 7F 10CM (SHEATH) ×2 IMPLANT
SHEATH PINNACLE 8F 10CM (SHEATH) ×2 IMPLANT
SHEATH PROBE COVER 6X72 (BAG) ×2 IMPLANT
SHEATH SWARTZ RAMP 8.5F 60CM (SHEATH) ×2 IMPLANT

## 2020-04-03 NOTE — H&P (Signed)
Electrophysiology Office Note   Date:  04/03/2020   ID:  Wayne Mcguire, DOB [birth year], MRN [id number]  PCP:  [name], MD  Cardiologist:  Agustin Cree Primary Electrophysiologist:  Leshea Jaggers Meredith Leeds, MD    Chief Complaint: AF/flutter   History of Present Illness: Wayne Mcguire is a 87 y.o. male who is being seen today for the evaluation of AF/flutter at the request of No ref. provider found. Presenting today for electrophysiology evaluation.  He has a history significant for paroxysmal atrial fibrillation/flutter status post cardioversion years ago.  He has also a history of nonsustained VT.  He was admitted to the hospital with a GI bleed.  He had a colonoscopy and was found to have diverticulosis.  He was loaded on amiodarone and was sent back to the hospital for cardioversion.  He presented back in atrial flutter.    Today, denies symptoms of palpitations, chest pain, shortness of breath, orthopnea, PND, lower extremity edema, claudication, dizziness, presyncope, syncope, bleeding, or neurologic sequela. The patient is tolerating medications without difficulties. Plan for flutter ablation.   Past Medical History:  Diagnosis Date  . Acute non-recurrent pansinusitis 04/18/2019  . 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 Adult situational stress disorder   . Anxiety   . Arthritis of left glenohumeral joint 06/20/2019  . Bladder cancer ([place])   . Bloody stool 11/27/2019  . Chest pain in adult 08/23/2016   Stress echo test done 09/05/15 Normal at 7 mets      . Chronic anticoagulation 11/15/2016  . Chronic anxiety 09/09/2016  . Chronic insomnia 09/09/2016  . Chronic pain of both knees 10/06/2017  . Chronic pain of both shoulders 09/14/2018  . Constipation 10/02/2018  . Costochondral chest pain 09/07/2016  . Cough with hemoptysis 01/06/2017  . Depression   . Diarrhea 10/02/2018  . Dysuria 01/06/2017  . Emphysema lung (HCC)    Mild  . Esophagitis   . GERD (gastroesophageal reflux disease)    . GERD without esophagitis 09/09/2016  . High risk medication use 08/23/2016   Flecanide  . Hospital discharge follow-up 12/24/2019  . Hypotension 03/10/2018  . Left lower quadrant abdominal pain 11/27/2019  . Low back pain 11/06/2018  . Lumbar herniated disc   . Lung nodule < 6cm on CT 09/30/2017  . Malaise and fatigue 09/30/2017  . Medicare annual wellness visit, subsequent 09/30/2017  . Nonsustained ventricular tachycardia ([place])   . Orchitis 11/06/2018  . PAF (paroxysmal atrial fibrillation) ([place]) 08/23/2016   CHADS2 vasc=2, he has elected not to accept anticoagulation  . Penile bleeding 10/02/2018  . Penile discharge 11/06/2018  . Peripheral edema 11/06/2018  . Persistent atrial fibrillation ([place]) 01/10/2020  . 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 Productive cough 11/06/2018  . Seasonal allergies   . Trochanteric bursitis of left hip 10/06/2017  . Unsteady gait 04/03/2018  . Ventricular tachycardia ([place]) 03/06/2019  . Vertigo   . Vitamin D deficiency 09/30/2017   Past Surgical History:  Procedure Laterality Date  . BLADDER SURGERY    . COLONOSCOPY    . Synchronous Cardioversion  01/16/2020  . TOTAL SHOULDER ARTHROPLASTY Left 06/20/2019   Procedure: TOTAL SHOULDER ARTHROPLASTY;  Surgeon: Hiram Gash, MD;  Location: [facility];  Service: Orthopedics;  Laterality: Left;  Marland Kitchen VEIN SURGERY Left      Current Facility-Administered Medications  Medication Dose Route Frequency Provider Last Rate Last Admin  . 0.9 %  sodium chloride infusion   Intravenous Continuous Skyelynn Rambeau, Ocie Doyne, MD 50  mL/hr at 04/03/20 1016 Continued from Pre-op at 04/03/20 1016    Allergies:   Terbinafine and related   Social History:  The patient  reports that he quit smoking about 1 years ago. His smoking use included cigarettes. He has a 70.00 pack-year smoking history. He has never used smokeless tobacco. He reports that he does not drink alcohol and does not use drugs.   Family History:  The  patient's family history includes Heart attack in his brother; Heart failure in his father.   ROS:  Please see the history of present illness.   Otherwise, review of systems is positive for none.   All other systems are reviewed and negative.   PHYSICAL EXAM: VS:  BP 130/81   Pulse 86   Temp 97.6 F (36.4 C) (Oral)   Resp 17   Ht 6\' 1"  (1.854 m)   Wt 81.6 kg   SpO2 100%   BMI 23.75 kg/m  , BMI Body mass index is 23.75 kg/m. GEN: Well nourished, well developed, in no acute distress  HEENT: normal  Neck: no JVD, carotid bruits, or masses Cardiac: RRR; no murmurs, rubs, or gallops,no edema  Respiratory:  clear to auscultation bilaterally, normal work of breathing GI: soft, nontender, nondistended, + BS MS: no deformity or atrophy  Skin: warm and dry Neuro:  Strength and sensation are intact Psych: euthymic mood, full affect   Recent Labs: 03/12/2020: BUN 14; Creatinine, Ser 1.13; Hemoglobin 14.4; Platelets 226; Potassium 4.9; Sodium 140    Lipid Panel  No results found for: CHOL, TRIG, HDL, CHOLHDL, VLDL, LDLCALC, LDLDIRECT   Wt Readings from Last 3 Encounters:  04/03/20 81.6 kg  02/18/20 82.6 kg  01/25/20 83 kg      Other studies Reviewed: Additional studies/ records that were reviewed today include: TTE 03/07/19  Review of the above records today demonstrates:  1. Left ventricular ejection fraction, by visual estimation, is 60 to  65%. The left ventricle has normal function. Left ventricular septal wall  thickness was mildly increased. Mildly increased left ventricular  posterior wall thickness. There is mildly  increased left ventricular hypertrophy.  2. Left ventricular diastolic parameters are consistent with Grade I  diastolic dysfunction (impaired relaxation).  3. The left ventricle has no regional wall motion abnormalities.  4. Global right ventricle has normal systolic function.The right  ventricular size is mildly enlarged. Mildly increased right  ventricular  wall thickness.  5. Left atrial size was normal.  6. Right atrial size was normal.  7. Mild mitral annular calcification.  8. The mitral valve is normal in structure. Mild mitral valve  regurgitation. No evidence of mitral stenosis.  9. The tricuspid valve is normal in structure.  10. The aortic valve is tricuspid. Aortic valve regurgitation is mild.  Mild aortic valve stenosis.  11. The pulmonic valve was normal in structure. Pulmonic valve  regurgitation is not visualized.  12. There is mild dilatation of the ascending aorta.  13. Mildly elevated pulmonary artery systolic pressure.  14. The inferior vena cava is dilated in size with >50% respiratory  variability, suggesting right atrial pressure of 8 mmHg.   Cardiac monitor 03/04/2019 personally reviewed Multiple episode of narrow complex tachycardia. 2 runs of ventricular tachycardia with longest and fastest episodes 8 beats at rate of 176 bpm, asymptomatic. Symptomatic PVCs and PACs which were infrequent    ASSESSMENT AND PLAN:  1.  Persistent atrial fibrillation/typical atrial flutter: Wayne Mcguire has presented today for surgery, with the diagnosis  of atrial flutter.  The various methods of treatment have been discussed with the patient and family. After consideration of risks, benefits and other options for treatment, the patient has consented to  Procedure(s): Catheter ablation as a surgical intervention .  Risks include but not limited to complete heart block, stroke, esophageal damage, nerve damage, bleeding, vascular damage, tamponade, perforation, MI, and death. The patient's history has been reviewed, patient examined, no change in status, stable for surgery.  I have reviewed the patient's chart and labs.  Questions were answered to the patient's satisfaction.    Areil Ottey Curt Bears, MD 04/03/2020 10:39 AM

## 2020-04-03 NOTE — Transfer of Care (Signed)
Immediate Anesthesia Transfer of Care Note  Patient: Wayne Mcguire  Procedure(s) Performed: A-FLUTTER ABLATION (N/A )  Patient Location: Cath Lab  Anesthesia Type:General  Level of Consciousness: awake, alert  and oriented  Airway & Oxygen Therapy: Patient Spontanous Breathing  Post-op Assessment: Report given to RN and Post -op Vital signs reviewed and stable  Post vital signs: Reviewed and stable  Last Vitals:  Vitals Value Taken Time  BP 115/66 04/03/20 1243  Temp    Pulse 66 04/03/20 1244  Resp 16 04/03/20 1244  SpO2 100 % 04/03/20 1244  Vitals shown include unvalidated device data.  Last Pain:  Vitals:   04/03/20 0915  TempSrc:   PainSc: 0-No pain      Patients Stated Pain Goal: 3 (97/94/80 1655)  Complications: No complications documented.

## 2020-04-03 NOTE — Discharge Instructions (Signed)
Femoral Site Care  This sheet gives you information about how to care for yourself after your procedure. Your health care provider may also give you more specific instructions. If you have problems or questions, contact your health care provider. What can I expect after the procedure? After the procedure, it is common to have:  Bruising that usually fades within 1-2 weeks.  Tenderness at the site. Follow these instructions at home: Wound care  Follow instructions from your health care provider about how to take care of your insertion site. Make sure you: ? Wash your hands with soap and water before you change your bandage (dressing). If soap and water are not available, use hand sanitizer. ? Change your dressing as told by your health care provider. ? Leave stitches (sutures), skin glue, or adhesive strips in place. These skin closures may need to stay in place for 2 weeks or longer. If adhesive strip edges start to loosen and curl up, you may trim the loose edges. Do not remove adhesive strips completely unless your health care provider tells you to do that.  Do not take baths, swim, or use a hot tub until your health care provider approves.  You may shower 24-48 hours after the procedure or as told by your health care provider. ? Gently wash the site with plain soap and water. ? Pat the area dry with a clean towel. ? Do not rub the site. This may cause bleeding.  Do not apply powder or lotion to the site. Keep the site clean and dry.  Check your femoral site every day for signs of infection. Check for: ? Redness, swelling, or pain. ? Fluid or blood. ? Warmth. ? Pus or a bad smell. Activity  For the first 2-3 days after your procedure, or as long as directed: ? Avoid climbing stairs as much as possible. ? Do not squat.  Do not lift anything that is heavier than 10 lb (4.5 kg), or the limit that you are told, until your health care provider says that it is safe.  Rest as  directed. ? Avoid sitting for a long time without moving. Get up to take short walks every 1-2 hours.  Do not drive for 24 hours if you were given a medicine to help you relax (sedative). General instructions  Take over-the-counter and prescription medicines only as told by your health care provider.  Keep all follow-up visits as told by your health care provider. This is important. Contact a health care provider if you have:  A fever or chills.  You have redness, swelling, or pain around your insertion site. Get help right away if:  The catheter insertion area swells very fast.  You pass out.  You suddenly start to sweat or your skin gets clammy.  The catheter insertion area is bleeding, and the bleeding does not stop when you hold steady pressure on the area.  The area near or just beyond the catheter insertion site becomes pale, cool, tingly, or numb. These symptoms may represent a serious problem that is an emergency. Do not wait to see if the symptoms will go away. Get medical help right away. Call your local emergency services (911 in the [place] Do not drive yourself to the hospital. Summary  After the procedure, it is common to have bruising that usually fades within 1-2 weeks.  Check your femoral site every day for signs of infection.  Do not lift anything that is heavier than 10 lb (4.5 kg), or   the limit that you are told, until your health care provider says that it is safe. This information is not intended to replace advice given to you by your health care provider. Make sure you discuss any questions you have with your health care provider. Document Revised: 10/12/2019 Document Reviewed: 10/12/2019 Elsevier Patient Education  2021 [employer]  

## 2020-04-03 NOTE — Anesthesia Procedure Notes (Signed)
Procedure Name: Intubation Date/Time: 04/03/2020 11:14 AM Performed by: Candis Shine, CRNA Pre-anesthesia Checklist: Patient identified, Emergency Drugs available, Suction available and Patient being monitored Patient Re-evaluated:Patient Re-evaluated prior to induction Oxygen Delivery Method: Circle System Utilized Preoxygenation: Pre-oxygenation with 100% oxygen Induction Type: IV induction Ventilation: Mask ventilation without difficulty and Oral airway inserted - appropriate to patient size Laryngoscope Size: Mac and 4 Grade View: Grade I Tube type: Oral Tube size: 7.5 mm Number of attempts: 1 Airway Equipment and Method: Stylet and Oral airway Placement Confirmation: ETT inserted through vocal cords under direct vision,  positive ETCO2 and breath sounds checked- equal and bilateral Secured at: 23 cm Tube secured with: Tape Dental Injury: Teeth and Oropharynx as per pre-operative assessment

## 2020-04-03 NOTE — Anesthesia Preprocedure Evaluation (Signed)
Anesthesia Evaluation  Patient identified by MRN, date of birth, ID band Patient awake    Reviewed: Allergy & Precautions, NPO status , Patient's Chart, lab work & pertinent test results  Airway Mallampati: II  TM Distance: >3 FB Neck ROM: Full    Dental  (+) Dental Advisory Given   Pulmonary COPD, former smoker,    breath sounds clear to auscultation       Cardiovascular negative cardio ROS   Rhythm:Regular Rate:Normal     Neuro/Psych negative neurological ROS     GI/Hepatic Neg liver ROS, GERD  ,  Endo/Other  negative endocrine ROS  Renal/GU negative Renal ROS     Musculoskeletal  (+) Arthritis ,   Abdominal   Peds  Hematology negative hematology ROS (+)   Anesthesia Other Findings   Reproductive/Obstetrics                             Anesthesia Physical Anesthesia Plan  ASA: III  Anesthesia Plan: General   Post-op Pain Management:    Induction: Intravenous  PONV Risk Score and Plan: 2 and Ondansetron, Dexamethasone and Treatment may vary due to age or medical condition  Airway Management Planned: Oral ETT  Additional Equipment: None  Intra-op Plan:   Post-operative Plan: Extubation in OR  Informed Consent: I have reviewed the patients History and Physical, chart, labs and discussed the procedure including the risks, benefits and alternatives for the proposed anesthesia with the patient or authorized representative who has indicated his/her understanding and acceptance.     Dental advisory given  Plan Discussed with: CRNA  Anesthesia Plan Comments:         Anesthesia Quick Evaluation

## 2020-04-03 NOTE — Progress Notes (Signed)
Pt ambulated without difficulty or bleeding.   Discharged home with wife who will drive and stay with pt x 24 hrs 

## 2020-04-04 ENCOUNTER — Encounter (HOSPITAL_COMMUNITY): Payer: Self-pay | Admitting: Cardiology

## 2020-04-04 ENCOUNTER — Telehealth: Payer: Self-pay | Admitting: Cardiology

## 2020-04-04 NOTE — Telephone Encounter (Signed)
New Message:    Wife called and said pt had an Ablation yesterday. She wants to know when can he remove the bandage?

## 2020-04-04 NOTE — Anesthesia Postprocedure Evaluation (Signed)
Anesthesia Post Note  Patient: Wayne Mcguire  Procedure(s) Performed: A-FLUTTER ABLATION (N/A )     Patient location during evaluation: PACU Anesthesia Type: General Level of consciousness: awake and alert Pain management: pain level controlled Vital Signs Assessment: post-procedure vital signs reviewed and stable Respiratory status: spontaneous breathing, nonlabored ventilation, respiratory function stable and patient connected to nasal cannula oxygen Cardiovascular status: blood pressure returned to baseline and stable Postop Assessment: no apparent nausea or vomiting Anesthetic complications: no   No complications documented.  Last Vitals:  Vitals:   04/03/20 1530 04/03/20 1600  BP: 108/61 132/73  Pulse: 66   Resp: 12 11  Temp:    SpO2: 97%     Last Pain:  Vitals:   04/03/20 0915  TempSrc:   PainSc: 0-No pain                 Tiajuana Amass

## 2020-04-04 NOTE — Telephone Encounter (Signed)
Advised ok to take off groin bandages, per Dr. Curt Bears. Wife verbalized understanding and agreeable to plan.

## 2020-04-14 ENCOUNTER — Telehealth: Payer: Self-pay | Admitting: Cardiology

## 2020-04-14 NOTE — Telephone Encounter (Signed)
No, patient need to continue with normal regular dose of Eliquis which is 5 mg twice daily. He can take some Benadryl to see if it helps. If it continue be a problem we may switch him from Eliquis to Xarelto in the future

## 2020-04-14 NOTE — Telephone Encounter (Signed)
Pt c/o medication issue: 1. Name of Medication: Eliquis  2. How are you currently taking this medication (dosage and times per day)? Twice a day 3. Are you having a reaction (difficulty breathing--STAT)?  Itchy skin dizziness and weak  4. What is your medication issue? Would like to know if he can 1/2 this medication.

## 2020-04-14 NOTE — Telephone Encounter (Signed)
Thank you, I will let them know.

## 2020-04-14 NOTE — Telephone Encounter (Signed)
Spoke with patient's wife about maintaining ht current dosage of Eliquis. She states "he had Xarelto before and it almost killed him" When asked what reacting he had she couldn;t tell me. She was advised for the patient to keep the same dosage and take Benadryl to help with symptoms. She verbalized upstanding and thanked me for getting back to her so quickly.

## 2020-04-14 NOTE — Telephone Encounter (Signed)
Spoke with patient's wife. She states the symptoms have been going on  for awhile, patient had an ablation 11 days ago. Patient was seen by PCP for other things. "They said his heart sounds good and steady. So I was wondering can we cut is Eliqus to 2.5 mg instead of the 5 mg since that is what is causing his symptoms" She is made aware we will reach out as soon as we can with an answer.

## 2020-04-15 ENCOUNTER — Other Ambulatory Visit: Payer: Self-pay

## 2020-04-24 ENCOUNTER — Encounter: Payer: Self-pay | Admitting: Cardiology

## 2020-04-24 ENCOUNTER — Other Ambulatory Visit: Payer: Self-pay

## 2020-04-24 ENCOUNTER — Ambulatory Visit: Payer: Medicare HMO | Admitting: Cardiology

## 2020-04-24 VITALS — BP 134/70 | HR 54 | Ht 73.0 in | Wt 183.0 lb

## 2020-04-24 DIAGNOSIS — I4729 Other ventricular tachycardia: Secondary | ICD-10-CM

## 2020-04-24 DIAGNOSIS — Z9889 Other specified postprocedural states: Secondary | ICD-10-CM

## 2020-04-24 DIAGNOSIS — Z8679 Personal history of other diseases of the circulatory system: Secondary | ICD-10-CM

## 2020-04-24 DIAGNOSIS — I480 Paroxysmal atrial fibrillation: Secondary | ICD-10-CM | POA: Diagnosis not present

## 2020-04-24 DIAGNOSIS — I472 Ventricular tachycardia: Secondary | ICD-10-CM | POA: Diagnosis not present

## 2020-04-24 DIAGNOSIS — K219 Gastro-esophageal reflux disease without esophagitis: Secondary | ICD-10-CM | POA: Diagnosis not present

## 2020-04-24 HISTORY — DX: Personal history of other diseases of the circulatory system: Z86.79

## 2020-04-24 HISTORY — DX: Personal history of other diseases of the circulatory system: Z98.890

## 2020-04-24 NOTE — Patient Instructions (Signed)

## 2020-04-24 NOTE — Progress Notes (Signed)
Cardiology Office Note:    Date:  04/24/2020   ID:  Wayne Mcguire, DOB [birth year], MRN [id number]  PCP:  [name], MD  Cardiologist:  [name], MD    Referring MD: [name], MD   Chief Complaint  Patient presents with  . Medication Management  I am weak and tired  History of Present Illness:    Wayne Mcguire is a 87 y.o. male with paroxysmal atrial flutter, atrial fibrillation status post recent atrial flutter ablation still anticoagulated, also essential hypertension, nonsustained ventricular tachycardia comes today 2 months of follow-up overall he said he is feeling weak complain of having dizziness but this is a old problem he has been dizzy for years.  He at health also question about potentially decreasing dose of Eliquis which I told him absolutely not we need to continue present dose 5 mg twice daily.  He is 87 years old but his weight is 183 pounds which is more than 60 kg and his creatinine last time was 1.13, therefore he does not meet criteria for 2.5 twice daily.  He also wanted me to talk about amiodarone and he does not want to take this medication I told him that we can decrease the dose of this medication to see if he feels any better.  Denies have any chest pain tightness squeezing pressure burning chest.  There is no altered mental status, there is no fever no chills.  No difficulty swallowing.  Past Medical History:  Diagnosis Date  . Acute non-recurrent pansinusitis 04/18/2019  . 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 Adult situational stress disorder   . Anxiety   . Arthritis of left glenohumeral joint 06/20/2019  . Bladder cancer ([place])   . Bloody stool 11/27/2019  . Chest pain in adult 08/23/2016   Stress echo test done 09/05/15 Normal at 7 mets      . Chronic anticoagulation 11/15/2016  . Chronic anxiety 09/09/2016  . Chronic insomnia 09/09/2016  . Chronic pain of both knees 10/06/2017  . Chronic pain of both shoulders 09/14/2018  . Constipation  10/02/2018  . Costochondral chest pain 09/07/2016  . Cough with hemoptysis 01/06/2017  . Depression   . Diarrhea 10/02/2018  . Dysuria 01/06/2017  . Emphysema lung (HCC)    Mild  . Esophagitis   . GERD (gastroesophageal reflux disease)   . GERD without esophagitis 09/09/2016  . Hematuria 03/18/2020  . High risk medication use 08/23/2016   Flecanide  . Hospital discharge follow-up 12/24/2019  . Hypotension 03/10/2018  . Left lower quadrant abdominal pain 11/27/2019  . Low back pain 11/06/2018  . Lumbar herniated disc   . Lung nodule < 6cm on CT 09/30/2017  . Malaise and fatigue 09/30/2017  . Medicare annual wellness visit, subsequent 09/30/2017  . Nonsustained ventricular tachycardia ([place])   . Orchitis 11/06/2018  . PAF (paroxysmal atrial fibrillation) ([place]) 08/23/2016   CHADS2 vasc=2, he has elected not to accept anticoagulation  . Paroxysmal atrial flutter ([place]) 01/25/2020  . Penile bleeding 10/02/2018  . Penile discharge 11/06/2018  . Peripheral edema 11/06/2018  . Persistent atrial fibrillation ([place]) 01/10/2020  . 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 Productive cough 11/06/2018  . Seasonal allergies   . Trochanteric bursitis of left hip 10/06/2017  . Unsteady gait 04/03/2018  . Ventricular tachycardia ([place]) 03/06/2019  . Vertigo   . Vitamin D deficiency 09/30/2017    Past Surgical History:  Procedure Laterality Date  . A-FLUTTER ABLATION N/A 04/03/2020  Procedure: A-FLUTTER ABLATION;  Surgeon: Constance Haw, MD;  Location: [facility];  Service: Cardiovascular;  Laterality: N/A;  . BLADDER SURGERY    . COLONOSCOPY    . Synchronous Cardioversion  01/16/2020  . TOTAL SHOULDER ARTHROPLASTY Left 06/20/2019   Procedure: TOTAL SHOULDER ARTHROPLASTY;  Surgeon: Hiram Gash, MD;  Location: [facility];  Service: Orthopedics;  Laterality: Left;  Marland Kitchen VEIN SURGERY Left     Current Medications: Current Meds  Medication Sig  . acetaminophen (TYLENOL) 500 MG tablet Take  500 mg by mouth in the morning and at bedtime.  Marland Kitchen amiodarone (PACERONE) 200 MG tablet Take 1 tablet (200 mg total) by mouth daily.  Marland Kitchen apixaban (ELIQUIS) 5 MG TABS tablet Take 1 tablet (5 mg total) by mouth 2 (two) times daily.  . Cholecalciferol (VITAMIN D-3) 125 MCG (5000 UT) TABS Take 5,000 Units by mouth daily.  Marland Kitchen CRANBERRY PO Take 1 tablet by mouth daily.  Marland Kitchen Dextromethorphan-guaiFENesin (MUCINEX DM PO) Take 1 tablet by mouth 2 (two) times daily.  . furosemide (LASIX) 20 MG tablet Take 20 mg by mouth daily as needed for fluid.  . magnesium oxide (MAG-OX) 400 MG tablet Take 400 mg by mouth daily.  . Multiple Vitamin (MULTIVITAMIN ADULT PO) Take 1 tablet by mouth in the morning and at bedtime.  . Multiple Vitamins-Minerals (HAIR SKIN AND NAILS FORMULA) TABS Take 1 tablet by mouth daily.  . Multiple Vitamins-Minerals (ICAPS AREDS 2 PO) Take 2 capsules by mouth daily.   . nitroGLYCERIN (NITROSTAT) 0.4 MG SL tablet Place 1 tablet (0.4 mg total) under the tongue every 5 (five) minutes as needed for chest pain.  Marland Kitchen olopatadine (PATANOL) 0.1 % ophthalmic solution Place 1 drop into both eyes daily as needed for allergies.  . Peppermint Oil (IBGARD) 90 MG CPCR Take 1 capsule by mouth in the morning and at bedtime.  . Probiotic Product (PROBIOTIC DAILY) CAPS Take 1 capsule by mouth daily.  . traMADol (ULTRAM) 50 MG tablet Take 50 mg by mouth every 12 (twelve) hours as needed for severe pain.  . traZODone (DESYREL) 150 MG tablet Take 150 mg by mouth at bedtime.      Allergies:   Terbinafine and related   Social History   Socioeconomic History  . Marital status: Married    Spouse name: Not on file  . Number of children: Not on file  . Years of education: Not on file  . Highest education level: Not on file  Occupational History  . Not on file  Tobacco Use  . Smoking status: Former Smoker    Packs/day: 1.00    Years: 70.00    Pack years: 70.00    Types: Cigarettes    Quit date: 04/2018     Years since quitting: 2.0  . Smokeless tobacco: Never Used  Vaping Use  . Vaping Use: Never used  Substance and Sexual Activity  . Alcohol use: No  . Drug use: No  . Sexual activity: Not on file  Other Topics Concern  . Not on file  Social History Narrative  . Not on file   Social Determinants of Health   Financial Resource Strain: Not on file  Food Insecurity: Not on file  Transportation Needs: Not on file  Physical Activity: Not on file  Stress: Not on file  Social Connections: Not on file     Family History: The patient's family history includes Heart attack in his brother; Heart failure in his father. ROS:  Please see the history of present illness.    All 14 point review of systems negative except as described per history of present illness  EKGs/Labs/Other Studies Reviewed:      Recent Labs: 03/12/2020: BUN 14; Creatinine, Ser 1.13; Hemoglobin 14.4; Platelets 226; Potassium 4.9; Sodium 140  Recent Lipid Panel No results found for: CHOL, TRIG, HDL, CHOLHDL, VLDL, LDLCALC, LDLDIRECT  Physical Exam:    VS:  BP 134/70 (BP Location: Left Arm, Patient Position: Sitting)   Pulse (!) 54   Ht 6\' 1"  (1.854 m)   Wt 183 lb (83 kg)   SpO2 94%   BMI 24.14 kg/m     Wt Readings from Last 3 Encounters:  04/24/20 183 lb (83 kg)  04/03/20 180 lb (81.6 kg)  02/18/20 182 lb 3.2 oz (82.6 kg)     GEN:  Well nourished, well developed in no acute distress HEENT: Normal NECK: No JVD; No carotid bruits LYMPHATICS: No lymphadenopathy CARDIAC: RRR, no murmurs, no rubs, no gallops RESPIRATORY:  Clear to auscultation without rales, wheezing or rhonchi  ABDOMEN: Soft, non-tender, non-distended MUSCULOSKELETAL:  No edema; No deformity  SKIN: Warm and dry LOWER EXTREMITIES: no swelling NEUROLOGIC:  Alert and oriented x 3 PSYCHIATRIC:  Normal affect   ASSESSMENT:    1. PAF (paroxysmal atrial fibrillation) (HCC)   2. Status post ablation of atrial flutter   3. GERD without  esophagitis   4. Nonsustained ventricular tachycardia (HCC)    PLAN:    In order of problems listed above:  1. Paroxysmal atrial fibrillation status/atrial flutter status post ablation doing well maintaining sinus rhythm.  Will reduce dose of amiodarone as per his wishes from 200 to 100 mg daily.  Continue anticoagulation at the present dose. 2. Nonsustained ventricular tachycardia no palpitations no dizziness no passing out 3. Dyslipidemia will call primary care physician to get fasting lipid profile.   Medication Adjustments/Labs and Tests Ordered: Current medicines are reviewed at length with the patient today.  Concerns regarding medicines are outlined above.  Orders Placed This Encounter  Procedures  . EKG 12-Lead   Medication changes: No orders of the defined types were placed in this encounter.   Signed, Park Liter, MD, [facility] 04/24/2020 11:59 AM    [place]

## 2020-05-12 ENCOUNTER — Encounter: Payer: Self-pay | Admitting: Cardiology

## 2020-05-12 ENCOUNTER — Other Ambulatory Visit: Payer: Self-pay

## 2020-05-12 ENCOUNTER — Ambulatory Visit: Payer: Medicare HMO | Admitting: Cardiology

## 2020-05-12 VITALS — BP 108/60 | HR 61 | Ht 73.0 in | Wt 181.2 lb

## 2020-05-12 DIAGNOSIS — Z79899 Other long term (current) drug therapy: Secondary | ICD-10-CM | POA: Diagnosis not present

## 2020-05-12 DIAGNOSIS — I4819 Other persistent atrial fibrillation: Secondary | ICD-10-CM

## 2020-05-12 DIAGNOSIS — I483 Typical atrial flutter: Secondary | ICD-10-CM

## 2020-05-12 LAB — HEPATIC FUNCTION PANEL
ALT: 17 IU/L (ref 0–44)
AST: 31 IU/L (ref 0–40)
Albumin: 4.1 g/dL (ref 3.6–4.6)
Alkaline Phosphatase: 96 IU/L (ref 44–121)
Bilirubin Total: 0.3 mg/dL (ref 0.0–1.2)
Bilirubin, Direct: 0.11 mg/dL (ref 0.00–0.40)
Total Protein: 6.4 g/dL (ref 6.0–8.5)

## 2020-05-12 LAB — TSH: TSH: 3.19 u[IU]/mL (ref 0.450–4.500)

## 2020-05-12 NOTE — Patient Instructions (Signed)
Medication Instructions:  Your physician has recommended you make the following change in your medication:  1. HOLD your Amiodarone for 2 weeks, please call the office and let us know if fatigue improves  *If you need a refill on your cardiac medications before your next appointment, please call your pharmacy*   Lab Work: Amiodarone surveillance labs today: TSH & LFTs If you have labs (blood work) drawn today and your tests are completely normal, you will receive your results only by: Marland Kitchen MyChart Message (if you have MyChart) OR . A paper copy in the mail If you have any lab test that is abnormal or we need to change your treatment, we will call you to review the results.   Testing/Procedures: None ordered   Follow-Up: At [facility], you and your health needs are our priority.  As part of our continuing mission to provide you with exceptional heart care, we have created designated Provider Care Teams.  These Care Teams include your primary Cardiologist (physician) and Advanced Practice Providers (APPs -  Physician Assistants and Nurse Practitioners) who all work together to provide you with the care you need, when you need it.  Your next appointment:   6 month(s)  The format for your next appointment:   In Person  Provider:   Allegra Lai, MD    Thank you for choosing [facility]!!   Trinidad Curet, RN [phone]

## 2020-05-12 NOTE — Progress Notes (Signed)
Electrophysiology Office Note   Date:  05/12/2020   ID:  Wayne Mcguire, DOB [birth year], MRN [id number]  PCP:  [name], MD  Cardiologist:  Agustin Cree Primary Electrophysiologist:  [name], MD    Chief Complaint: AF/flutter   History of Present Illness: Wayne Mcguire is a 87 y.o. male who is being seen today for the evaluation of AF/flutter at the request of [name], MD. Presenting today for electrophysiology evaluation.  He has a history of paroxysmal atrial fibrillation, and typical atrial flutter.  He also has a history of nonsustained VT.  He had a stress test and echo which were both unremarkable.  He is status post atrial flutter ablation 04/03/2020.  Today, denies symptoms of palpitations, chest pain, shortness of breath, orthopnea, PND, lower extremity edema, claudication, dizziness, presyncope, syncope, bleeding, or neurologic sequela. The patient is tolerating medications without difficulties.  Since his ablation, he has had no further episodes of atrial flutter.  He is felt well and is without major complaint.  He is able to do all of his daily activities without restriction.  He does state though that he has continued to have fatigue, though it is improved since his ablation.  Past Medical History:  Diagnosis Date  . Acute non-recurrent pansinusitis 04/18/2019  . 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 Adult situational stress disorder   . Anxiety   . Arthritis of left glenohumeral joint 06/20/2019  . Bladder cancer ([place])   . Bloody stool 11/27/2019  . Chest pain in adult 08/23/2016   Stress echo test done 09/05/15 Normal at 7 mets      . Chronic anticoagulation 11/15/2016  . Chronic anxiety 09/09/2016  . Chronic insomnia 09/09/2016  . Chronic pain of both knees 10/06/2017  . Chronic pain of both shoulders 09/14/2018  . Constipation 10/02/2018  . Costochondral chest pain 09/07/2016  . Cough with hemoptysis 01/06/2017  . Depression   . Diarrhea 10/02/2018  .  Dysuria 01/06/2017  . Emphysema lung (HCC)    Mild  . Esophagitis   . GERD (gastroesophageal reflux disease)   . GERD without esophagitis 09/09/2016  . Hematuria 03/18/2020  . High risk medication use 08/23/2016   Flecanide  . Hospital discharge follow-up 12/24/2019  . Hypotension 03/10/2018  . Left lower quadrant abdominal pain 11/27/2019  . Low back pain 11/06/2018  . Lumbar herniated disc   . Lung nodule < 6cm on CT 09/30/2017  . Malaise and fatigue 09/30/2017  . Medicare annual wellness visit, subsequent 09/30/2017  . Nonsustained ventricular tachycardia ([place])   . Orchitis 11/06/2018  . PAF (paroxysmal atrial fibrillation) ([place]) 08/23/2016   CHADS2 vasc=2, he has elected not to accept anticoagulation  . Paroxysmal atrial flutter ([place]) 01/25/2020  . Penile bleeding 10/02/2018  . Penile discharge 11/06/2018  . Peripheral edema 11/06/2018  . Persistent atrial fibrillation ([place]) 01/10/2020  . 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 Productive cough 11/06/2018  . Seasonal allergies   . Trochanteric bursitis of left hip 10/06/2017  . Unsteady gait 04/03/2018  . Ventricular tachycardia ([place]) 03/06/2019  . Vertigo   . Vitamin D deficiency 09/30/2017   Past Surgical History:  Procedure Laterality Date  . A-FLUTTER ABLATION N/A 04/03/2020   Procedure: A-FLUTTER ABLATION;  Surgeon: Constance Haw, MD;  Location: [facility];  Service: Cardiovascular;  Laterality: N/A;  . BLADDER SURGERY    . COLONOSCOPY    . Synchronous Cardioversion  01/16/2020  . TOTAL SHOULDER ARTHROPLASTY Left  06/20/2019   Procedure: TOTAL SHOULDER ARTHROPLASTY;  Surgeon: Hiram Gash, MD;  Location: [facility];  Service: Orthopedics;  Laterality: Left;  Marland Kitchen VEIN SURGERY Left      Current Outpatient Medications  Medication Sig Dispense Refill  . acetaminophen (TYLENOL) 500 MG tablet Take 500 mg by mouth in the morning and at bedtime.    Marland Kitchen amiodarone (PACERONE) 200 MG tablet Take 100 mg by mouth daily.     Marland Kitchen apixaban (ELIQUIS) 5 MG TABS tablet Take 1 tablet (5 mg total) by mouth 2 (two) times daily. 180 tablet 3  . Cholecalciferol (VITAMIN D-3) 125 MCG (5000 UT) TABS Take 5,000 Units by mouth daily.    Marland Kitchen CRANBERRY PO Take 1 tablet by mouth daily.    Marland Kitchen Dextromethorphan-guaiFENesin (MUCINEX DM PO) Take 1 tablet by mouth 2 (two) times daily.    . furosemide (LASIX) 20 MG tablet Take 20 mg by mouth daily as needed for fluid.    . magnesium oxide (MAG-OX) 400 MG tablet Take 400 mg by mouth daily.    . Multiple Vitamin (MULTIVITAMIN ADULT PO) Take 1 tablet by mouth in the morning and at bedtime.    . Multiple Vitamins-Minerals (HAIR SKIN AND NAILS FORMULA) TABS Take 1 tablet by mouth daily.    . Multiple Vitamins-Minerals (ICAPS AREDS 2 PO) Take 2 capsules by mouth daily.     . nitroGLYCERIN (NITROSTAT) 0.4 MG SL tablet Place 1 tablet (0.4 mg total) under the tongue every 5 (five) minutes as needed for chest pain. 25 tablet 5  . olopatadine (PATANOL) 0.1 % ophthalmic solution Place 1 drop into both eyes daily as needed for allergies.    . Peppermint Oil (IBGARD) 90 MG CPCR Take 1 capsule by mouth in the morning and at bedtime.    . Probiotic Product (PROBIOTIC DAILY) CAPS Take 1 capsule by mouth daily.    . traMADol (ULTRAM) 50 MG tablet Take 50 mg by mouth every 12 (twelve) hours as needed for severe pain.    . traZODone (DESYREL) 150 MG tablet Take 150 mg by mouth at bedtime.      No current facility-administered medications for this visit.    Allergies:   Terbinafine and related   Social History:  The patient  reports that he quit smoking about 2 years ago. His smoking use included cigarettes. He has a 70.00 pack-year smoking history. He has never used smokeless tobacco. He reports that he does not drink alcohol and does not use drugs.   Family History:  The patient's family history includes Heart attack in his brother; Heart failure in his father.   ROS:  Please see the history of present  illness.   Otherwise, review of systems is positive for none.   All other systems are reviewed and negative.   PHYSICAL EXAM: VS:  BP 108/60   Pulse 61   Ht 6\' 1"  (1.854 m)   Wt 181 lb 3.2 oz (82.2 kg)   SpO2 100%   BMI 23.91 kg/m  , BMI Body mass index is 23.91 kg/m. GEN: Well nourished, well developed, in no acute distress  HEENT: normal  Neck: no JVD, carotid bruits, or masses Cardiac: RRR; no murmurs, rubs, or gallops,no edema  Respiratory:  clear to auscultation bilaterally, normal work of breathing GI: soft, nontender, nondistended, + BS MS: no deformity or atrophy  Skin: warm and dry Neuro:  Strength and sensation are intact Psych: euthymic mood, full affect  EKG:  EKG is ordered today.  Personal review of the ekg ordered shows sinus rhythm, PVC, rate 61  Recent Labs: 03/12/2020: BUN 14; Creatinine, Ser 1.13; Hemoglobin 14.4; Platelets 226; Potassium 4.9; Sodium 140    Lipid Panel  No results found for: CHOL, TRIG, HDL, CHOLHDL, VLDL, LDLCALC, LDLDIRECT   Wt Readings from Last 3 Encounters:  05/12/20 181 lb 3.2 oz (82.2 kg)  04/24/20 183 lb (83 kg)  04/03/20 180 lb (81.6 kg)      Other studies Reviewed: Additional studies/ records that were reviewed today include: TTE 03/07/19  Review of the above records today demonstrates:  1. Left ventricular ejection fraction, by visual estimation, is 60 to  65%. The left ventricle has normal function. Left ventricular septal wall  thickness was mildly increased. Mildly increased left ventricular  posterior wall thickness. There is mildly  increased left ventricular hypertrophy.  2. Left ventricular diastolic parameters are consistent with Grade I  diastolic dysfunction (impaired relaxation).  3. The left ventricle has no regional wall motion abnormalities.  4. Global right ventricle has normal systolic function.The right  ventricular size is mildly enlarged. Mildly increased right ventricular  wall thickness.  5.  Left atrial size was normal.  6. Right atrial size was normal.  7. Mild mitral annular calcification.  8. The mitral valve is normal in structure. Mild mitral valve  regurgitation. No evidence of mitral stenosis.  9. The tricuspid valve is normal in structure.  10. The aortic valve is tricuspid. Aortic valve regurgitation is mild.  Mild aortic valve stenosis.  11. The pulmonic valve was normal in structure. Pulmonic valve  regurgitation is not visualized.  12. There is mild dilatation of the ascending aorta.  13. Mildly elevated pulmonary artery systolic pressure.  14. The inferior vena cava is dilated in size with >50% respiratory  variability, suggesting right atrial pressure of 8 mmHg.   Cardiac monitor 03/04/2019 personally reviewed Multiple episode of narrow complex tachycardia. 2 runs of ventricular tachycardia with longest and fastest episodes 8 beats at rate of 176 bpm, asymptomatic. Symptomatic PVCs and PACs which were infrequent    ASSESSMENT AND PLAN:  1.  Persistent atrial fibrillation/typical atrial flutter: Currently on amiodarone and Eliquis with a CHA2DS2-VASc of 2.  He is now status post atrial flutter ablation on 04/03/2020.  High risk medication monitoring.  He remains in sinus rhythm.  He is having some fatigue.  We Kennard Fildes stop his amiodarone to see if his fatigue improves.  If not, we Wilhemina Grall started at 100 mg.  We Noeh Sparacino check labs today for amiodarone monitoring.  2.  Ventricular tachycardia: Noted on stress test December 2020.  Echo shows normal ejection fraction.  No further work-up.     Current medicines are reviewed at length with the patient today.   The patient does not have concerns regarding his medicines.  The following changes were made today: None  Labs/ tests ordered today include:  Orders Placed This Encounter  Procedures  . TSH  . Hepatic function panel  . EKG 12-Lead     Disposition:   FU with Tinie Mcgloin 6 months  Signed, Marlean Mortell Meredith Leeds, MD  05/12/2020 10:53 AM     [facility] [address] [place] [address] [phone] (office) [phone] (fax)

## 2020-05-13 ENCOUNTER — Telehealth: Payer: Self-pay | Admitting: Cardiology

## 2020-05-13 NOTE — Telephone Encounter (Signed)
Spoke to patient wife per dpr. She wanted to know what ekg code we put in for 04/24/20. I advised her that we used the same order that we always do. She states she was billed more for the ekg than normal. I suggested she contact our billing department for more information she reported that she already did. I advised possibly this could be from the new year starting and deductibles not being met, however I do not know the answer to be sure as this is a billing question and I do not have that information. She asked how often the patient will need ekgs, I advised her at the very least once a year but possibly more depending on the the physician's decision at the time of each visit. She had not further questions.

## 2020-05-13 NOTE — Telephone Encounter (Signed)
Pt wife called in and stated she already spoke to [facility] billing office and they told her that the EKG that was coded 13-May-2020 was coded in correct .  She stated they normally just $35.00 at time of visit and will rec'd a bill for $14 but this time they rec'd a bill for $40.00.      Best number [phone]

## 2020-05-13 NOTE — Telephone Encounter (Signed)
Left message for patient to return call.

## 2020-05-28 ENCOUNTER — Telehealth: Payer: Self-pay | Admitting: Cardiology

## 2020-05-28 NOTE — Telephone Encounter (Signed)
Patient's wife called back to say patient is doing fine with the medication.

## 2020-05-28 NOTE — Telephone Encounter (Signed)
Left message to call back to discuss further

## 2020-06-06 DIAGNOSIS — M19011 Primary osteoarthritis, right shoulder: Secondary | ICD-10-CM | POA: Insufficient documentation

## 2020-06-06 DIAGNOSIS — Z96612 Presence of left artificial shoulder joint: Secondary | ICD-10-CM

## 2020-06-06 HISTORY — DX: Presence of left artificial shoulder joint: Z96.612

## 2020-06-09 DIAGNOSIS — R634 Abnormal weight loss: Secondary | ICD-10-CM

## 2020-06-09 DIAGNOSIS — G8929 Other chronic pain: Secondary | ICD-10-CM

## 2020-06-09 DIAGNOSIS — M25512 Pain in left shoulder: Secondary | ICD-10-CM

## 2020-06-09 HISTORY — DX: Abnormal weight loss: R63.4

## 2020-06-09 HISTORY — DX: Other chronic pain: G89.29

## 2020-06-09 HISTORY — DX: Pain in left shoulder: M25.512

## 2020-06-18 HISTORY — PX: MOHS SURGERY: SHX181

## 2020-06-25 HISTORY — PX: SKIN SURGERY: SHX2413

## 2020-06-27 ENCOUNTER — Other Ambulatory Visit: Payer: Self-pay

## 2020-06-27 DIAGNOSIS — F4320 Adjustment disorder, unspecified: Secondary | ICD-10-CM | POA: Insufficient documentation

## 2020-07-02 ENCOUNTER — Other Ambulatory Visit: Payer: Self-pay

## 2020-07-02 ENCOUNTER — Encounter: Payer: Self-pay | Admitting: Cardiology

## 2020-07-02 ENCOUNTER — Ambulatory Visit: Payer: Medicare HMO | Admitting: Cardiology

## 2020-07-02 VITALS — BP 100/70 | HR 60 | Ht 73.0 in | Wt 176.0 lb

## 2020-07-02 DIAGNOSIS — I472 Ventricular tachycardia: Secondary | ICD-10-CM

## 2020-07-02 DIAGNOSIS — I4729 Other ventricular tachycardia: Secondary | ICD-10-CM

## 2020-07-02 DIAGNOSIS — I4891 Unspecified atrial fibrillation: Secondary | ICD-10-CM

## 2020-07-02 DIAGNOSIS — Z7901 Long term (current) use of anticoagulants: Secondary | ICD-10-CM

## 2020-07-02 DIAGNOSIS — I4892 Unspecified atrial flutter: Secondary | ICD-10-CM | POA: Diagnosis not present

## 2020-07-02 DIAGNOSIS — F419 Anxiety disorder, unspecified: Secondary | ICD-10-CM

## 2020-07-02 NOTE — Patient Instructions (Signed)

## 2020-07-02 NOTE — Progress Notes (Signed)
Cardiology Office Note:    Date:  07/02/2020   ID:  Wayne Mcguire, DOB 08/12/1932, MRN [id number]  PCP:  Wayne Broker, MD  Cardiologist:  Wayne Campus, MD    Referring MD: Wayne Broker, MD   Chief Complaint  Patient presents with  . Follow-up  Doing fine but still complaining of being weak and tired  History of Present Illness:    Wayne Mcguire is a 87 y.o. male with past medical history significant for paroxysmal a flutter/atrial fibrillation, status post recent atrial flutter ablation, essential hypertension, nonsustained ventricular tachycardia.  He is coming today to my office for follow-up.  Overall he is doing quite well in spite of the fact he thinks he is not.  He is 87 years old but he is still able to work all day he have about my 1 mile track and he is able to do the track.  On top of that this morning apparently he clamped the roots and fix some leak there.  Overall I consider that he is doing quite well.  Denies have any palpitations no passing out.  No swelling of lower extremities.  Past Medical History:  Diagnosis Date  . Acute non-recurrent pansinusitis 04/18/2019  . 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 Adult situational stress disorder   . Anxiety   . Arthritis of left glenohumeral joint 06/20/2019  . Bladder cancer ([place])   . Bloody stool 11/27/2019  . Chest pain in adult 08/23/2016   Stress echo test done 09/05/15 Normal at 7 mets      . Chronic anticoagulation 11/15/2016  . Chronic anxiety 09/09/2016  . Chronic insomnia 09/09/2016  . Chronic left shoulder pain 06/09/2020  . Chronic pain of both knees 10/06/2017  . Chronic pain of both shoulders 09/14/2018  . Constipation 10/02/2018  . Costochondral chest pain 09/07/2016  . Cough with hemoptysis 01/06/2017  . Depression   . Diarrhea 10/02/2018  . Dysuria 01/06/2017  . Emphysema lung (HCC)    Mild  . Esophagitis   . GERD (gastroesophageal reflux disease)   . GERD without esophagitis 09/09/2016  . H/O total  shoulder replacement, 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  . Hematuria 03/18/2020  . High risk medication use 08/23/2016   Flecanide  . Hospital discharge follow-up 12/24/2019  . Hypotension 03/10/2018  . Left lower quadrant abdominal pain 11/27/2019  . Low back pain 11/06/2018  . Lumbar herniated disc   . Lung nodule < 6cm on CT 09/30/2017  . Malaise and fatigue 09/30/2017  . Medicare annual wellness visit, subsequent 09/30/2017  . Nonsustained ventricular tachycardia ([place])   . Orchitis 11/06/2018  . PAF (paroxysmal atrial fibrillation) ([place]) 08/23/2016   CHADS2 vasc=2, he has elected not to accept anticoagulation  . Paroxysmal atrial flutter ([place]) 01/25/2020  . Penile bleeding 10/02/2018  . Penile discharge 11/06/2018  . Peripheral edema 11/06/2018  . Persistent atrial fibrillation ([place]) 01/10/2020  . 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 Productive cough 11/06/2018  . Seasonal allergies   . Status post ablation of atrial flutter 04/24/2020  . Trochanteric bursitis of left hip 10/06/2017  . Unsteady gait 04/03/2018  . Ventricular tachycardia ([place]) 03/06/2019  . Vertigo   . Vitamin D deficiency 09/30/2017  . Weight loss 06/09/2020    Past Surgical History:  Procedure Laterality Date  . A-FLUTTER ABLATION N/A 04/03/2020   Procedure: A-FLUTTER ABLATION;  Surgeon: Constance Haw, MD;  Location: [facility];  Service: Cardiovascular;  Laterality: N/A;  . BLADDER SURGERY    .  COLONOSCOPY    . MOHS SURGERY Right 06/18/2020   ear  . SKIN SURGERY Left 06/25/2020   hand  . Synchronous Cardioversion  01/16/2020  . TOTAL SHOULDER ARTHROPLASTY Left 06/20/2019   Procedure: TOTAL SHOULDER ARTHROPLASTY;  Surgeon: Hiram Gash, MD;  Location: [facility];  Service: Orthopedics;  Laterality: Left;  Marland Kitchen VEIN SURGERY Left     Current Medications: Current Meds  Medication Sig  . acetaminophen (TYLENOL) 500 MG tablet Take 500 mg by mouth in the morning and at bedtime.  Marland Kitchen apixaban (ELIQUIS) 5 MG TABS  tablet Take 1 tablet (5 mg total) by mouth 2 (two) times daily.  . Cholecalciferol (VITAMIN D-3) 125 MCG (5000 UT) TABS Take 5,000 Units by mouth daily.  Marland Kitchen CRANBERRY PO Take 1 tablet by mouth daily.  Marland Kitchen Dextromethorphan-guaiFENesin (MUCINEX DM PO) Take 1 tablet by mouth 2 (two) times daily.  . furosemide (LASIX) 20 MG tablet Take 20 mg by mouth daily as needed for fluid.  . magnesium oxide (MAG-OX) 400 MG tablet Take 400 mg by mouth daily.  . Multiple Vitamin (MULTIVITAMIN ADULT PO) Take 1 tablet by mouth in the morning and at bedtime.  . Multiple Vitamins-Minerals (HAIR SKIN AND NAILS FORMULA) TABS Take 1 tablet by mouth daily.  . Multiple Vitamins-Minerals (ICAPS AREDS 2 PO) Take 2 capsules by mouth daily.   . nitroGLYCERIN (NITROSTAT) 0.4 MG SL tablet Place 1 tablet (0.4 mg total) under the tongue every 5 (five) minutes as needed for chest pain.  Marland Kitchen olopatadine (PATANOL) 0.1 % ophthalmic solution Place 1 drop into both eyes daily as needed for allergies.  . Peppermint Oil (IBGARD) 90 MG CPCR Take 1 capsule by mouth in the morning and at bedtime.  . Probiotic Product (PROBIOTIC DAILY) CAPS Take 1 capsule by mouth daily.  Marland Kitchen sulfamethoxazole-trimethoprim (BACTRIM DS) 800-160 MG tablet Take 1 tablet by mouth 2 (two) times daily.  . traMADol (ULTRAM) 50 MG tablet Take 50 mg by mouth every 12 (twelve) hours as needed for severe pain.  . traZODone (DESYREL) 150 MG tablet Take 150 mg by mouth at bedtime.      Allergies:   Terbinafine and related   Social History   Socioeconomic History  . Marital status: Married    Spouse name: Not on file  . Number of children: Not on file  . Years of education: Not on file  . Highest education level: Not on file  Occupational History  . Not on file  Tobacco Use  . Smoking status: Former Smoker    Packs/day: 1.00    Years: 70.00    Pack years: 70.00    Types: Cigarettes    Quit date: 04/2018    Years since quitting: 2.1  . Smokeless tobacco: Never  Used  Vaping Use  . Vaping Use: Never used  Substance and Sexual Activity  . Alcohol use: No  . Drug use: No  . Sexual activity: Not on file  Other Topics Concern  . Not on file  Social History Narrative  . Not on file   Social Determinants of Health   Financial Resource Strain: Not on file  Food Insecurity: Not on file  Transportation Needs: Not on file  Physical Activity: Not on file  Stress: Not on file  Social Connections: Not on file     Family History: The patient's family history includes Heart attack in his brother; Heart failure in his father. ROS:   Please see the history of present illness.  All 14 point review of systems negative except as described per history of present illness  EKGs/Labs/Other Studies Reviewed:      Recent Labs: 03/12/2020: BUN 14; Creatinine, Ser 1.13; Hemoglobin 14.4; Platelets 226; Potassium 4.9; Sodium 140 05/12/2020: ALT 17; TSH 3.190  Recent Lipid Panel No results found for: CHOL, TRIG, HDL, CHOLHDL, VLDL, LDLCALC, LDLDIRECT  Physical Exam:    VS:  BP 100/70 (BP Location: Right Arm, Patient Position: Sitting, Cuff Size: Normal)   Pulse 60   Ht 6\' 1"  (1.854 m)   Wt 176 lb (79.8 kg)   SpO2 93%   BMI 23.22 kg/m     Wt Readings from Last 3 Encounters:  07/02/20 176 lb (79.8 kg)  05/12/20 181 lb 3.2 oz (82.2 kg)  04/24/20 183 lb (83 kg)     GEN:  Well nourished, well developed in no acute distress HEENT: Normal NECK: No JVD; No carotid bruits LYMPHATICS: No lymphadenopathy CARDIAC: RRR, no murmurs, no rubs, no gallops RESPIRATORY:  Clear to auscultation without rales, wheezing or rhonchi  ABDOMEN: Soft, non-tender, non-distended MUSCULOSKELETAL:  No edema; No deformity  SKIN: Warm and dry LOWER EXTREMITIES: no swelling NEUROLOGIC:  Alert and oriented x 3 PSYCHIATRIC:  Normal affect   ASSESSMENT:    1. Atrial fibrillation status post cardioversion (HCC)   2. Paroxysmal atrial flutter ([place])   3. Nonsustained  ventricular tachycardia ([place])   4. Chronic anxiety   5. Chronic anticoagulation    PLAN:    In order of problems listed above:  1. Atrial fibrillation status post cardioversion, s/p atrial flutter ablation.  Still anticoagulated which I will continue.  Withdrawal of amiodarone has been done.  He cannot tell me if he feels much better he is maybe slightly.  I will keep him out of amiodarone for another 3 months to see if he improves and if not we will go back on small dose of that medication. 2. History of nonsustained ventricular tachycardia but preserved left ventricle ejection fraction, denies have any dizziness palpitation or passing out. 3. Dyslipidemia, I do not see any fasting lipid profile in computer.  We will schedule him to have the test. 4. Chronic anticoagulation will continue.  His CHADS2 Vascor is at least 2. 5. Chronic anxiety still ongoing contributing factor.  I did review note by EP for this visit   Medication Adjustments/Labs and Tests Ordered: Current medicines are reviewed at length with the patient today.  Concerns regarding medicines are outlined above.  Orders Placed This Encounter  Procedures  . EKG 12-Lead   Medication changes: No orders of the defined types were placed in this encounter.   Signed, Park Liter, MD, [facility] 07/02/2020 2:29 PM    [place]

## 2020-09-29 DIAGNOSIS — F320 Major depressive disorder, single episode, mild: Secondary | ICD-10-CM

## 2020-09-29 DIAGNOSIS — R531 Weakness: Secondary | ICD-10-CM | POA: Insufficient documentation

## 2020-09-29 HISTORY — DX: Major depressive disorder, single episode, mild: F32.0

## 2020-10-21 ENCOUNTER — Telehealth: Payer: Self-pay | Admitting: Cardiology

## 2020-10-21 NOTE — Telephone Encounter (Signed)
Pt and his wife states the pt has been having chest pain off and on for 2 weeks. Pain is in the lt chest and radiates into the lt arm and is relieved with 2 to 3 ntg. They are requesting an earlier appt than 9/27. How do you advise?

## 2020-10-21 NOTE — Telephone Encounter (Signed)
Pt c/o of Chest Pain: STAT if CP now or developed within 24 hours  1. Are you having CP right now? Yes   2. Are you experiencing any other symptoms (ex. SOB, nausea, vomiting, sweating)? SOB  3. How long have you been experiencing CP? Several weeks  4. Is your CP continuous or coming and going? Coming and going  5. Have you taken Nitroglycerin? Yes  ?

## 2020-10-21 NOTE — Telephone Encounter (Signed)
Wayne Mcguire is returning [name] call. Please advise.

## 2020-10-21 NOTE — Telephone Encounter (Signed)
Left message on patients voicemail to please return our call.   

## 2020-10-22 NOTE — Telephone Encounter (Signed)
Appt made and aware to go to the ED for pain not relieved with NTG. Verbalized understanding and had no additional questions

## 2020-10-22 NOTE — Telephone Encounter (Signed)
Spoke with Dr. Agustin Cree regarding pt and he request pt to be seen next week-ok to dbl book. Pt should go to the ED for pain unrelieved by ntg.  Called pt but number is busy.

## 2020-10-24 DIAGNOSIS — L570 Actinic keratosis: Secondary | ICD-10-CM | POA: Diagnosis not present

## 2020-10-24 DIAGNOSIS — C44629 Squamous cell carcinoma of skin of left upper limb, including shoulder: Secondary | ICD-10-CM | POA: Diagnosis not present

## 2020-10-28 ENCOUNTER — Other Ambulatory Visit: Payer: Self-pay

## 2020-10-29 ENCOUNTER — Other Ambulatory Visit: Payer: Self-pay

## 2020-10-29 ENCOUNTER — Telehealth (HOSPITAL_COMMUNITY): Payer: Self-pay | Admitting: *Deleted

## 2020-10-29 ENCOUNTER — Ambulatory Visit: Payer: Medicare HMO | Admitting: Cardiology

## 2020-10-29 ENCOUNTER — Encounter: Payer: Self-pay | Admitting: Cardiology

## 2020-10-29 VITALS — BP 102/60 | HR 60 | Ht 73.0 in | Wt 179.4 lb

## 2020-10-29 DIAGNOSIS — I480 Paroxysmal atrial fibrillation: Secondary | ICD-10-CM

## 2020-10-29 DIAGNOSIS — Z8679 Personal history of other diseases of the circulatory system: Secondary | ICD-10-CM

## 2020-10-29 DIAGNOSIS — Z9889 Other specified postprocedural states: Secondary | ICD-10-CM

## 2020-10-29 DIAGNOSIS — I4892 Unspecified atrial flutter: Secondary | ICD-10-CM

## 2020-10-29 DIAGNOSIS — R079 Chest pain, unspecified: Secondary | ICD-10-CM | POA: Diagnosis not present

## 2020-10-29 DIAGNOSIS — I4819 Other persistent atrial fibrillation: Secondary | ICD-10-CM | POA: Diagnosis not present

## 2020-10-29 MED ORDER — NITROGLYCERIN 0.4 MG SL SUBL: 0.4000 mg | SUBLINGUAL_TABLET | SUBLINGUAL | 5 refills | Status: DC | PRN

## 2020-10-29 MED ORDER — ISOSORBIDE MONONITRATE ER 30 MG PO TB24: 30.0000 mg | ORAL_TABLET | Freq: Every day | ORAL | 3 refills | Status: DC

## 2020-10-29 NOTE — Telephone Encounter (Signed)
Left message on voicemail per DPR in reference to upcoming appointment scheduled on 11/04/20 at 8:00 with detailed instructions given per Myocardial Perfusion Study Information Sheet for the test. LM to arrive 15 minutes early, and that it is imperative to arrive on time for appointment to keep from having the test rescheduled. If you need to cancel or reschedule your appointment, please call the office within 24 hours of your appointment. Failure to do so may result in a cancellation of your appointment, and a $50 no show fee. Phone number given for call back for any questions.

## 2020-10-29 NOTE — Progress Notes (Signed)
Cardiology Office Note:    Date:  10/29/2020   ID:  Wayne Mcguire, DOB 1932/09/13, MRN [id number]  PCP:  [name], MD  Cardiologist:  [name], MD    Referring MD: [name], MD   Chief Complaint  Patient presents with   Chest Pain    History of Present Illness:    Wayne Mcguire is a 87 y.o. male with past medical history significant for paroxysmal atrial flutter/atrial fibrillation, status post atrial flutter ablation, essential hypertension, nonsustained ventricular tachycardia.  He requested to be seen because about 2 weeks ago for few days he experienced chest pain.  He described the sensation as tightness in the middle of the chest.  He took nitroglycerin usually 2 nitroglycerin relieved the pain.  He did have mild shortness of breath associated with this sensation but overall it is a difficult to get good history from him.  He tells me that he gets allergies and that is why he is always short of breath.  Within the last 2 weeks however there was no problems.  He still very active can do things.  In terms of arrhythmia he does not have any palpitations at the stable from that aspect.  Past Medical History:  Diagnosis Date   Acute non-recurrent pansinusitis 04/18/2019   Adrenal adenoma 09/30/2017   Adult situational stress disorder    Anxiety    Arthritis of left glenohumeral joint 06/20/2019   Bladder cancer (HCC)    Bloody stool 11/27/2019   Chest pain in adult 08/23/2016   Stress echo test done 09/05/15 Normal at 7 mets       Chronic anticoagulation 11/15/2016   Chronic anxiety 09/09/2016   Chronic insomnia 09/09/2016   Chronic left shoulder pain 06/09/2020   Chronic pain of both knees 10/06/2017   Chronic pain of both shoulders 09/14/2018   Constipation 10/02/2018   Costochondral chest pain 09/07/2016   Cough with hemoptysis 01/06/2017   Depression    Diarrhea 10/02/2018   Dysuria 01/06/2017   Emphysema lung (HCC)    Mild   Esophagitis    GERD  (gastroesophageal reflux disease)    GERD without esophagitis 09/09/2016   H/O total shoulder replacement, 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   Hematuria 03/18/2020   High risk medication use 08/23/2016   [facility] discharge follow-up 12/24/2019   Hypotension 03/10/2018   Left lower quadrant abdominal pain 11/27/2019   Low back pain 11/06/2018   Lumbar herniated disc    Lung nodule < 6cm on CT 09/30/2017   Malaise and fatigue 09/30/2017   Medicare annual wellness visit, subsequent 09/30/2017   Nonsustained ventricular tachycardia ([place])    Orchitis 11/06/2018   PAF (paroxysmal atrial fibrillation) ([place]) 08/23/2016   CHADS2 vasc=2, he has elected not to accept anticoagulation   Paroxysmal atrial flutter ([place]) 01/25/2020   Penile bleeding 10/02/2018   Penile discharge 11/06/2018   Peripheral edema 11/06/2018   Persistent atrial fibrillation ([place]) 01/10/2020   Primary osteoarthritis involving multiple joints 03/29/2019   Productive cough 11/06/2018   Seasonal allergies    Status post ablation of atrial flutter 04/24/2020   Trochanteric bursitis of left hip 10/06/2017   Unsteady gait 04/03/2018   Ventricular tachycardia ([place]) 03/06/2019   Vertigo    Vitamin D deficiency 09/30/2017   Weight loss 06/09/2020    Past Surgical History:  Procedure Laterality Date   A-FLUTTER ABLATION N/A 04/03/2020   Procedure: A-FLUTTER ABLATION;  Surgeon: Constance Haw, MD;  Location: [place]  CV [facility];  Service: Cardiovascular;  Laterality: N/A;   BLADDER SURGERY     COLONOSCOPY     MOHS SURGERY Right 06/18/2020   ear   SKIN SURGERY Left 06/25/2020   hand   Synchronous Cardioversion  01/16/2020   TOTAL SHOULDER ARTHROPLASTY Left 06/20/2019   Procedure: TOTAL SHOULDER ARTHROPLASTY;  Surgeon: Hiram Gash, MD;  Location: [facility];  Service: Orthopedics;  Laterality: Left;   VEIN SURGERY Left     Current Medications: Current Meds  Medication Sig   acetaminophen (TYLENOL) 500 MG tablet Take 500 mg by mouth in the morning and  at bedtime.   albuterol (VENTOLIN HFA) 108 (90 Base) MCG/ACT inhaler Inhale 2 puffs into the lungs as needed for shortness of breath.   apixaban (ELIQUIS) 5 MG TABS tablet Take 1 tablet (5 mg total) by mouth 2 (two) times daily.   Cholecalciferol (VITAMIN D-3) 125 MCG (5000 UT) TABS Take 5,000 Units by mouth daily.   CRANBERRY PO Take 1 tablet by mouth daily. Unknown strength   Dextromethorphan-guaiFENesin (MUCINEX DM PO) Take 1 tablet by mouth 2 (two) times daily.   furosemide (LASIX) 20 MG tablet Take 20 mg by mouth daily as needed for fluid.   magnesium oxide (MAG-OX) 400 MG tablet Take 400 mg by mouth daily.   mirtazapine (REMERON) 15 MG tablet Take 15 mg by mouth at bedtime.   Multiple Vitamin (MULTIVITAMIN ADULT PO) Take 1 tablet by mouth in the morning and at bedtime. Unknown strength   Multiple Vitamins-Minerals (HAIR SKIN AND NAILS FORMULA) TABS Take 1 tablet by mouth daily. Unknown strength   Multiple Vitamins-Minerals (ICAPS AREDS 2 PO) Take 2 capsules by mouth daily. Unknown strength   nitroGLYCERIN (NITROSTAT) 0.4 MG SL tablet Place 1 tablet (0.4 mg total) under the tongue every 5 (five) minutes as needed for chest pain.   olopatadine (PATANOL) 0.1 % ophthalmic solution Place 1 drop into both eyes daily as needed for allergies.   Peppermint Oil (IBGARD) 90 MG CPCR Take 1 capsule by mouth in the morning and at bedtime.   Probiotic Product (PROBIOTIC DAILY) CAPS Take 1 capsule by mouth daily. Unknown strength   sulfamethoxazole-trimethoprim (BACTRIM DS) 800-160 MG tablet Take 1 tablet by mouth 2 (two) times daily.   traMADol (ULTRAM) 50 MG tablet Take 50 mg by mouth every 12 (twelve) hours as needed for severe pain.   traZODone (DESYREL) 150 MG tablet Take 150 mg by mouth at bedtime.      Allergies:   Terbinafine and related   Social History   Socioeconomic History   Marital status: Married    Spouse name: Not on file   Number of children: Not on file   Years of education: Not  on file   Highest education level: Not on file  Occupational History   Not on file  Tobacco Use   Smoking status: Former    Packs/day: 1.00    Years: 70.00    Pack years: 70.00    Types: Cigarettes    Quit date: 04/2018    Years since quitting: 2.5   Smokeless tobacco: Never  Vaping Use   Vaping Use: Never used  Substance and Sexual Activity   Alcohol use: No   Drug use: No   Sexual activity: Not on file  Other Topics Concern   Not on file  Social History Narrative   Not on file   Social Determinants of Health   Financial Resource Strain: Not on file  Food Insecurity:  Not on file  Transportation Needs: Not on file  Physical Activity: Not on file  Stress: Not on file  Social Connections: Not on file     Family History: The patient's family history includes Heart attack in his brother; Heart failure in his father. ROS:   Please see the history of present illness.    All 14 point review of systems negative except as described per history of present illness  EKGs/Labs/Other Studies Reviewed:      Recent Labs: 03/12/2020: BUN 14; Creatinine, Ser 1.13; Hemoglobin 14.4; Platelets 226; Potassium 4.9; Sodium 140 05/12/2020: ALT 17; TSH 3.190  Recent Lipid Panel No results found for: CHOL, TRIG, HDL, CHOLHDL, VLDL, LDLCALC, LDLDIRECT  Physical Exam:    VS:  BP 102/60 (BP Location: Right Arm, Patient Position: Sitting)   Pulse 60   Ht '6\' 1"'$  (1.854 m)   Wt 179 lb 6.4 oz (81.4 kg)   SpO2 97%   BMI 23.67 kg/m     Wt Readings from Last 3 Encounters:  10/29/20 179 lb 6.4 oz (81.4 kg)  07/02/20 176 lb (79.8 kg)  05/12/20 181 lb 3.2 oz (82.2 kg)     GEN:  Well nourished, well developed in no acute distress HEENT: Normal NECK: No JVD; No carotid bruits LYMPHATICS: No lymphadenopathy CARDIAC: RRR, no murmurs, no rubs, no gallops RESPIRATORY:  Clear to auscultation without rales, wheezing or rhonchi  ABDOMEN: Soft, non-tender, non-distended MUSCULOSKELETAL:  No  edema; No deformity  SKIN: Warm and dry LOWER EXTREMITIES: no swelling NEUROLOGIC:  Alert and oriented x 3 PSYCHIATRIC:  Normal affect   ASSESSMENT:    1. Persistent atrial fibrillation (HCC)   2. Chest pain of uncertain etiology   3. PAF (paroxysmal atrial fibrillation) (HCC)   4. Paroxysmal atrial flutter (HCC)   5. Status post ablation of atrial flutter    PLAN:    In order of problems listed above:  Chest pain which is uncertain etiology.  I did review the evaluation of his coronary arteries last time done in 2020 form of stress test.  I asked him to start taking isosorbide mononitrate 30 mg daily, will send him a new prescription for nitroglycerin to take it as needed.  He will be scheduled to have [place].  I told him to go to the emergency room if 3 nitroglycerin does not relieve the pain. Paroxysmal atrial fibrillation EKG showed normal sinus rhythm with some APCs.  We will continue anticoagulation. Status post atrial flutter ablation.  Noted. Dyslipidemia: I do not have his rate his fasting lipid profile we will call primary care physician to get a copy of it.   Medication Adjustments/Labs and Tests Ordered: Current medicines are reviewed at length with the patient today.  Concerns regarding medicines are outlined above.  Orders Placed This Encounter  Procedures   EKG 12-Lead   Medication changes: No orders of the defined types were placed in this encounter.   Signed, Park Liter, MD, [facility] 10/29/2020 9:27 AM    [place]

## 2020-10-29 NOTE — Patient Instructions (Signed)
Medication Instructions:  Your physician has recommended you make the following change in your medication:  START: Imdur 30 mg take one tablet by mouth daily.  *If you need a refill on your cardiac medications before your next appointment, please call your pharmacy*   Lab Work: None If you have labs (blood work) drawn today and your tests are completely normal, you will receive your results only by: [place] (if you have MyChart) OR A paper copy in the mail If you have any lab test that is abnormal or we need to change your treatment, we will call you to review the results.   Testing/Procedures:   [facility] [address] [place], [place] [address] Phone:  [phone]    Please arrive 15 minutes prior to your appointment time for registration and insurance purposes.  The test will take approximately 3 to 4 hours to complete; you may bring reading material.  If someone comes with you to your appointment, they will need to remain in the main lobby due to limited space in the testing area. **If you are pregnant or breastfeeding, please notify the nuclear lab prior to your appointment**  How to prepare for your Myocardial Perfusion Test: Do not eat or drink 3 hours prior to your test, except you may have water. Do not consume products containing caffeine (regular or decaffeinated) 12 hours prior to your test. (ex: coffee, chocolate, sodas, tea). Do bring a list of your current medications with you.  If not listed below, you may take your medications as normal. Do wear comfortable clothes (no dresses or overalls) and walking shoes, tennis shoes preferred (No heels or open toe shoes are allowed). Do NOT wear cologne, perfume, aftershave, or lotions (deodorant is allowed). If these instructions are not followed, your test will have to be rescheduled.  Please report to [address] for your test.  If you have questions or concerns about your  appointment, you can call the [facility] at [phone].  If you cannot keep your appointment, please provide 24 hours notification to the Nuclear Lab, to avoid a possible $50 charge to your account.    Follow-Up: At [facility], you and your health needs are our priority.  As part of our continuing mission to provide you with exceptional heart care, we have created designated Provider Care Teams.  These Care Teams include your primary Cardiologist (physician) and Advanced Practice Providers (APPs -  Physician Assistants and Nurse Practitioners) who all work together to provide you with the care you need, when you need it.  We recommend signing up for the patient portal called "MyChart".  Sign up information is provided on this After Visit Summary.  MyChart is used to connect with patients for Virtual Visits (Telemedicine).  Patients are able to view lab/test results, encounter notes, upcoming appointments, etc.  Non-urgent messages can be sent to your provider as well.   To learn more about what you can do with MyChart, go to [email or web address].    Your next appointment:   3 month(s)  The format for your next appointment:   In Person  Provider:   [name], MD   Other Instructions

## 2020-10-30 ENCOUNTER — Other Ambulatory Visit: Payer: Self-pay

## 2020-10-30 DIAGNOSIS — R079 Chest pain, unspecified: Secondary | ICD-10-CM

## 2020-11-04 ENCOUNTER — Other Ambulatory Visit: Payer: Self-pay

## 2020-11-04 ENCOUNTER — Ambulatory Visit (INDEPENDENT_AMBULATORY_CARE_PROVIDER_SITE_OTHER): Payer: Medicare HMO

## 2020-11-04 DIAGNOSIS — R079 Chest pain, unspecified: Secondary | ICD-10-CM | POA: Diagnosis not present

## 2020-11-04 LAB — MYOCARDIAL PERFUSION IMAGING
LV dias vol: 151 mL (ref 62–150)
LV sys vol: 70 mL
Nuc Stress EF: 54 %
Peak HR: 70 {beats}/min
Rest HR: 53 {beats}/min
Rest Nuclear Isotope Dose: 10.1 mCi
SDS: 2
SRS: 3
SSS: 5
Stress Nuclear Isotope Dose: 32 mCi
TID: 1.01

## 2020-11-04 MED ORDER — TECHNETIUM TC 99M TETROFOSMIN IV KIT
10.1000 | PACK | Freq: Once | INTRAVENOUS | Status: AC | PRN
  Administered 2020-11-04: 10.1 via INTRAVENOUS

## 2020-11-04 MED ORDER — REGADENOSON 0.4 MG/5ML IV SOLN
0.4000 mg | Freq: Once | INTRAVENOUS | Status: AC
  Administered 2020-11-04: 0.4 mg via INTRAVENOUS

## 2020-11-04 MED ORDER — TECHNETIUM TC 99M TETROFOSMIN IV KIT
32.0000 | PACK | Freq: Once | INTRAVENOUS | Status: AC | PRN
  Administered 2020-11-04: 32 via INTRAVENOUS

## 2020-11-07 ENCOUNTER — Telehealth: Payer: Self-pay

## 2020-11-07 NOTE — Telephone Encounter (Signed)
-----   Message from Park Liter, MD sent at 11/07/2020  9:18 AM EDT ----- Stress test showing no evidence of ischemia

## 2020-11-07 NOTE — Telephone Encounter (Signed)
Spoke with patient regarding results and recommendation.  Patient verbalizes understanding and is agreeable to plan of care. Advised patient to call back with any issues or concerns.  

## 2020-11-10 ENCOUNTER — Ambulatory Visit: Payer: Medicare HMO | Admitting: Cardiology

## 2020-11-17 ENCOUNTER — Ambulatory Visit: Payer: Medicare HMO | Admitting: Cardiology

## 2020-11-18 ENCOUNTER — Ambulatory Visit: Payer: Medicare HMO | Admitting: Cardiology

## 2020-11-28 DIAGNOSIS — Z936 Other artificial openings of urinary tract status: Secondary | ICD-10-CM | POA: Diagnosis not present

## 2020-12-06 DIAGNOSIS — Z23 Encounter for immunization: Secondary | ICD-10-CM | POA: Diagnosis not present

## 2021-01-07 DIAGNOSIS — C4402 Squamous cell carcinoma of skin of lip: Secondary | ICD-10-CM | POA: Diagnosis not present

## 2021-01-13 DIAGNOSIS — R051 Acute cough: Secondary | ICD-10-CM | POA: Diagnosis not present

## 2021-01-13 DIAGNOSIS — R0981 Nasal congestion: Secondary | ICD-10-CM | POA: Diagnosis not present

## 2021-01-13 DIAGNOSIS — R062 Wheezing: Secondary | ICD-10-CM | POA: Diagnosis not present

## 2021-01-13 DIAGNOSIS — M791 Myalgia, unspecified site: Secondary | ICD-10-CM | POA: Diagnosis not present

## 2021-01-13 DIAGNOSIS — J449 Chronic obstructive pulmonary disease, unspecified: Secondary | ICD-10-CM | POA: Diagnosis not present

## 2021-01-13 DIAGNOSIS — Z20828 Contact with and (suspected) exposure to other viral communicable diseases: Secondary | ICD-10-CM | POA: Diagnosis not present

## 2021-01-22 ENCOUNTER — Telehealth: Payer: Self-pay

## 2021-01-22 NOTE — Telephone Encounter (Signed)
Pt's wife is returning call to Triage

## 2021-01-22 NOTE — Telephone Encounter (Signed)
PAF providers portion of Eliquis form renewal faxed. Patient portion of the renewal form mailed today with request to complete the requirements and form and mail back to [place]. I was unable to reach the patient regarding this inquiry.

## 2021-01-23 NOTE — Telephone Encounter (Signed)
Returned call, no answer, line busy, will try later

## 2021-01-28 NOTE — Telephone Encounter (Signed)
Spoke with Mrs. Laurance Flatten, she stated has not received forms I mailed out but patient does have an appt on Thursday. If nor received by then, patient will bring proof of income and we will complete his portion of the renewal in office.

## 2021-01-29 ENCOUNTER — Other Ambulatory Visit: Payer: Self-pay

## 2021-01-29 ENCOUNTER — Encounter: Payer: Self-pay | Admitting: Cardiology

## 2021-01-29 ENCOUNTER — Telehealth: Payer: Self-pay

## 2021-01-29 ENCOUNTER — Ambulatory Visit: Payer: Medicare HMO | Admitting: Cardiology

## 2021-01-29 VITALS — BP 102/60 | HR 48 | Ht 73.0 in | Wt 178.4 lb

## 2021-01-29 DIAGNOSIS — R0789 Other chest pain: Secondary | ICD-10-CM | POA: Diagnosis not present

## 2021-01-29 DIAGNOSIS — I480 Paroxysmal atrial fibrillation: Secondary | ICD-10-CM

## 2021-01-29 DIAGNOSIS — C44622 Squamous cell carcinoma of skin of right upper limb, including shoulder: Secondary | ICD-10-CM | POA: Diagnosis not present

## 2021-01-29 DIAGNOSIS — E861 Hypovolemia: Secondary | ICD-10-CM | POA: Diagnosis not present

## 2021-01-29 DIAGNOSIS — Z9889 Other specified postprocedural states: Secondary | ICD-10-CM | POA: Diagnosis not present

## 2021-01-29 DIAGNOSIS — J431 Panlobular emphysema: Secondary | ICD-10-CM

## 2021-01-29 DIAGNOSIS — I9589 Other hypotension: Secondary | ICD-10-CM | POA: Diagnosis not present

## 2021-01-29 DIAGNOSIS — Z8679 Personal history of other diseases of the circulatory system: Secondary | ICD-10-CM | POA: Diagnosis not present

## 2021-01-29 NOTE — Progress Notes (Signed)
Cardiology Office Note:    Date:  01/29/2021   ID:  [name], DOB August 04, 1932, MRN [id number]  PCP:  [name], MD  Cardiologist:  [name], MD    Referring MD: [name], MD   Chief Complaint  Patient presents with   mild chest pain     History of Present Illness:    Wayne Mcguire is a 87 y.o. male  with past medical history significant for paroxysmal atrial flutter/atrial fibrillation, status post atrial flutter ablation, essential hypertension, nonsustained ventricular tachycardia.  He requested to be seen because about 2 weeks ago for few days he experienced chest pain.  He described the sensation as tightness in the middle of the chest.  He took nitroglycerin usually 2 nitroglycerin relieved the pain.  He did have mild shortness of breath associated with this sensation but overall it is a difficult to get good history from him.   Today he tells me that this pain happen typically at rest.  Usually when he walks to be interested he can walk on his property walk around stress to stay healthy and it does not happen then.  He did have a stress test done which showed no evidence of ischemia.  Past Medical History:  Diagnosis Date   Acute non-recurrent pansinusitis 04/18/2019   Adrenal adenoma 09/30/2017   Adult situational stress disorder    Anxiety    Arthritis of left glenohumeral joint 06/20/2019   Bladder cancer (HCC)    Bloody stool 11/27/2019   Chest pain in adult 08/23/2016   Stress echo test done 09/05/15 Normal at 7 mets       Chronic anticoagulation 11/15/2016   Chronic anxiety 09/09/2016   Chronic insomnia 09/09/2016   Chronic left shoulder pain 06/09/2020   Chronic pain of both knees 10/06/2017   Chronic pain of both shoulders 09/14/2018   Constipation 10/02/2018   Costochondral chest pain 09/07/2016   Cough with hemoptysis 01/06/2017   Depression    Diarrhea 10/02/2018   Dysuria 01/06/2017   Emphysema lung (HCC)    Mild   Esophagitis    GERD  (gastroesophageal reflux disease)    GERD without esophagitis 09/09/2016   H/O total shoulder replacement, 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   Hematuria 03/18/2020   High risk medication use 08/23/2016   [facility] discharge [facility] 12/24/2019   Hypotension 03/10/2018   Left lower quadrant abdominal pain 11/27/2019   Low back pain 11/06/2018   Lumbar herniated disc    Lung nodule < 6cm on CT 09/30/2017   Malaise and fatigue 09/30/2017   Medicare annual wellness visit, subsequent 09/30/2017   Nonsustained ventricular tachycardia    Orchitis 11/06/2018   PAF (paroxysmal atrial fibrillation) ([place]) 08/23/2016   CHADS2 vasc=2, he has elected not to accept anticoagulation   Paroxysmal atrial flutter ([place]) 01/25/2020   Penile bleeding 10/02/2018   Penile discharge 11/06/2018   Peripheral edema 11/06/2018   Persistent atrial fibrillation ([place]) 01/10/2020   Primary osteoarthritis involving multiple joints 03/29/2019   Productive cough 11/06/2018   Seasonal allergies    Status post ablation of atrial flutter 04/24/2020   Trochanteric bursitis of left hip 10/06/2017   Unsteady gait 04/03/2018   Ventricular tachycardia 03/06/2019   Vertigo    Vitamin D deficiency 09/30/2017   Weight loss 06/09/2020    Past Surgical History:  Procedure Laterality Date   A-FLUTTER ABLATION N/A 04/03/2020   Procedure: A-FLUTTER ABLATION;  Surgeon: Constance Haw, MD;  Location: [place]  CV [facility];  Service: Cardiovascular;  Laterality: N/A;   BLADDER SURGERY     COLONOSCOPY     MOHS SURGERY Right 06/18/2020   ear   SKIN SURGERY Left 06/25/2020   hand   Synchronous Cardioversion  01/16/2020   TOTAL SHOULDER ARTHROPLASTY Left 06/20/2019   Procedure: TOTAL SHOULDER ARTHROPLASTY;  Surgeon: Hiram Gash, MD;  Location: [facility];  Service: Orthopedics;  Laterality: Left;   VEIN SURGERY Left     Current Medications: Current Meds  Medication Sig   acetaminophen (TYLENOL) 500 MG tablet Take 500 mg by mouth in the morning and at bedtime.    albuterol (VENTOLIN HFA) 108 (90 Base) MCG/ACT inhaler Inhale 2 puffs into the lungs as needed for shortness of breath.   apixaban (ELIQUIS) 5 MG TABS tablet Take 1 tablet (5 mg total) by mouth 2 (two) times daily.   Cholecalciferol (VITAMIN D-3) 125 MCG (5000 UT) TABS Take 5,000 Units by mouth daily.   CRANBERRY PO Take 1 tablet by mouth daily. Unknown strength   Dextromethorphan-guaiFENesin (MUCINEX DM PO) Take 1 tablet by mouth 2 (two) times daily.   furosemide (LASIX) 20 MG tablet Take 20 mg by mouth daily as needed for fluid.   isosorbide mononitrate (IMDUR) 30 MG 24 hr tablet Take 1 tablet (30 mg total) by mouth daily.   magnesium oxide (MAG-OX) 400 MG tablet Take 400 mg by mouth daily.   mirtazapine (REMERON) 15 MG tablet Take 15 mg by mouth at bedtime.   Multiple Vitamin (MULTIVITAMIN ADULT PO) Take 1 tablet by mouth in the morning and at bedtime. Unknown strength   Multiple Vitamins-Minerals (HAIR SKIN AND NAILS FORMULA) TABS Take 1 tablet by mouth daily. Unknown strength   Multiple Vitamins-Minerals (ICAPS AREDS 2 PO) Take 2 capsules by mouth daily. Unknown strength   nitroGLYCERIN (NITROSTAT) 0.4 MG SL tablet Place 1 tablet (0.4 mg total) under the tongue every 5 (five) minutes as needed for chest pain.   olopatadine (PATANOL) 0.1 % ophthalmic solution Place 1 drop into both eyes daily as needed for allergies.   Peppermint Oil (IBGARD) 90 MG CPCR Take 1 capsule by mouth in the morning and at bedtime.   Probiotic Product (PROBIOTIC DAILY) CAPS Take 1 capsule by mouth daily. Unknown strength   sulfamethoxazole-trimethoprim (BACTRIM DS) 800-160 MG tablet Take 1 tablet by mouth 2 (two) times daily.   traMADol (ULTRAM) 50 MG tablet Take 50 mg by mouth every 12 (twelve) hours as needed for severe pain.   traZODone (DESYREL) 150 MG tablet Take 150 mg by mouth at bedtime.      Allergies:   Terbinafine and related   Social History   Socioeconomic History   Marital status: Married     Spouse name: Not on file   Number of children: Not on file   Years of education: Not on file   Highest education level: Not on file  Occupational History   Not on file  Tobacco Use   Smoking status: Former    Packs/day: 1.00    Years: 70.00    Pack years: 70.00    Types: Cigarettes    Quit date: 04/2018    Years since quitting: 2.7   Smokeless tobacco: Never  Vaping Use   Vaping Use: Never used  Substance and Sexual Activity   Alcohol use: No   Drug use: No   Sexual activity: Not on file  Other Topics Concern   Not on file  Social History Narrative   Not  on file   Social Determinants of Health   Financial Resource Strain: Not on file  Food Insecurity: Not on file  Transportation Needs: Not on file  Physical Activity: Not on file  Stress: Not on file  Social Connections: Not on file     Family History: The patient's family history includes Heart attack in his brother; Heart failure in his father. ROS:   Please see the history of present illness.    All 14 point review of systems negative except as described per history of present illness  EKGs/Labs/Other Studies Reviewed:      Recent Labs: 03/12/2020: BUN 14; Creatinine, Ser 1.13; Hemoglobin 14.4; Platelets 226; Potassium 4.9; Sodium 140 05/12/2020: ALT 17; TSH 3.190  Recent Lipid Panel No results found for: CHOL, TRIG, HDL, CHOLHDL, VLDL, LDLCALC, LDLDIRECT  Physical Exam:    VS:  BP 102/60 (BP Location: Left Arm, Patient Position: Sitting)   Pulse (!) 48   Ht 6\' 1"  (1.854 m)   Wt 178 lb 6.4 oz (80.9 kg)   SpO2 95%   BMI 23.54 kg/m     Wt Readings from Last 3 Encounters:  01/29/21 178 lb 6.4 oz (80.9 kg)  11/04/20 179 lb (81.2 kg)  10/29/20 179 lb 6.4 oz (81.4 kg)     GEN:  Well nourished, well developed in no acute distress HEENT: Normal NECK: No JVD; No carotid bruits LYMPHATICS: No lymphadenopathy CARDIAC: RRR, no murmurs, no rubs, no gallops RESPIRATORY:  Clear to auscultation without rales,  wheezing or rhonchi  ABDOMEN: Soft, non-tender, non-distended MUSCULOSKELETAL:  No edema; No deformity  SKIN: Warm and dry LOWER EXTREMITIES: no swelling NEUROLOGIC:  Alert and oriented x 3 PSYCHIATRIC:  Normal affect   ASSESSMENT:    1. PAF (paroxysmal atrial fibrillation) ([place])   2. Hypotension due to hypovolemia   3. Panlobular emphysema (HCC)   4. Status post ablation of atrial flutter   5. Atypical chest pain    PLAN:    In order of problems listed above:  Atypical chest pain.  Stress test negative.  Corrected resting of the pain look very noncardiac.  I suspect he may be having some GI issue.  Asked him to start taking Maalox a lot and see if it relieves the sensation.  If that is the case he need to follow-up with his primary care physician or GI doctor, he probably can benefit from proton pump inhibitor. Paroxysmal atrial fibrillation/atrial flutter.  He is anticoagulant Eliquis which I will continue, he denies having a palpitations.  He is somewhat bradycardic today on the physical exam I will do EKG to check to rhythm. Emphysema: Stable. Status post atrial flutter ablation no recurrences    Medication Adjustments/Labs and Tests Ordered: Current medicines are reviewed at length with the patient today.  Concerns regarding medicines are outlined above.  No orders of the defined types were placed in this encounter.  Medication changes: No orders of the defined types were placed in this encounter.   Signed, Park Liter, MD, [facility] 01/29/2021 11:27 AM    [place]

## 2021-01-29 NOTE — Patient Instructions (Signed)

## 2021-01-29 NOTE — Telephone Encounter (Signed)
Patient rortion of renewal form  for [employer] faxed

## 2021-01-29 NOTE — Telephone Encounter (Signed)
Patient dropped of renewal forms for [employer] with income verification. Forms faxed. Provider's portion of the application was faxed on 01/22/21.

## 2021-01-29 NOTE — Addendum Note (Signed)
Addended by: Senaida Ores on: 01/29/2021 01:53 PM   Modules accepted: Orders

## 2021-02-09 ENCOUNTER — Telehealth: Payer: Self-pay | Admitting: Cardiology

## 2021-02-09 DIAGNOSIS — R0602 Shortness of breath: Secondary | ICD-10-CM | POA: Diagnosis not present

## 2021-02-09 NOTE — Telephone Encounter (Signed)
Patient's wife returning call in regards to [facility] form. Please advise.

## 2021-02-10 NOTE — Telephone Encounter (Signed)
Addressed Mrs/ Splitt concerns

## 2021-02-17 DIAGNOSIS — M47816 Spondylosis without myelopathy or radiculopathy, lumbar region: Secondary | ICD-10-CM | POA: Diagnosis not present

## 2021-02-17 DIAGNOSIS — S22080A Wedge compression fracture of T11-T12 vertebra, initial encounter for closed fracture: Secondary | ICD-10-CM | POA: Diagnosis not present

## 2021-02-17 DIAGNOSIS — M545 Low back pain, unspecified: Secondary | ICD-10-CM | POA: Diagnosis not present

## 2021-02-17 DIAGNOSIS — M8588 Other specified disorders of bone density and structure, other site: Secondary | ICD-10-CM | POA: Diagnosis not present

## 2021-02-17 DIAGNOSIS — M5137 Other intervertebral disc degeneration, lumbosacral region: Secondary | ICD-10-CM | POA: Diagnosis not present

## 2021-02-17 DIAGNOSIS — W108XXA Fall (on) (from) other stairs and steps, initial encounter: Secondary | ICD-10-CM | POA: Diagnosis not present

## 2021-02-17 HISTORY — DX: Wedge compression fracture of T11-T12 vertebra, initial encounter for closed fracture: S22.080A

## 2021-03-04 DIAGNOSIS — I8310 Varicose veins of unspecified lower extremity with inflammation: Secondary | ICD-10-CM | POA: Diagnosis not present

## 2021-03-04 DIAGNOSIS — L209 Atopic dermatitis, unspecified: Secondary | ICD-10-CM | POA: Diagnosis not present

## 2021-03-04 DIAGNOSIS — L570 Actinic keratosis: Secondary | ICD-10-CM | POA: Diagnosis not present

## 2021-03-04 DIAGNOSIS — C44622 Squamous cell carcinoma of skin of right upper limb, including shoulder: Secondary | ICD-10-CM | POA: Diagnosis not present

## 2021-03-18 DIAGNOSIS — L720 Epidermal cyst: Secondary | ICD-10-CM | POA: Diagnosis not present

## 2021-03-18 DIAGNOSIS — B372 Candidiasis of skin and nail: Secondary | ICD-10-CM | POA: Diagnosis not present

## 2021-03-18 DIAGNOSIS — C44629 Squamous cell carcinoma of skin of left upper limb, including shoulder: Secondary | ICD-10-CM | POA: Diagnosis not present

## 2021-04-02 DIAGNOSIS — R531 Weakness: Secondary | ICD-10-CM | POA: Diagnosis not present

## 2021-04-02 DIAGNOSIS — Z0000 Encounter for general adult medical examination without abnormal findings: Secondary | ICD-10-CM | POA: Diagnosis not present

## 2021-04-02 DIAGNOSIS — I480 Paroxysmal atrial fibrillation: Secondary | ICD-10-CM | POA: Diagnosis not present

## 2021-04-02 DIAGNOSIS — F320 Major depressive disorder, single episode, mild: Secondary | ICD-10-CM | POA: Diagnosis not present

## 2021-04-02 DIAGNOSIS — R5383 Other fatigue: Secondary | ICD-10-CM | POA: Diagnosis not present

## 2021-04-07 ENCOUNTER — Telehealth: Payer: Self-pay

## 2021-04-07 NOTE — Telephone Encounter (Signed)
-----   Message from [place] sent at 03/19/2021  7:43 AM EST ----- Juluis Rainier

## 2021-04-07 NOTE — Telephone Encounter (Signed)
Per [employer]ont[employer] rep stated, they are missing the provider's portion of the application, the information was cut off during fax transition. They have not received the additional fax I sent on 12/14. This was refax. I tried calling the patient but his  number was occupied.

## 2021-04-08 ENCOUNTER — Other Ambulatory Visit: Payer: Self-pay | Admitting: Cardiology

## 2021-04-08 ENCOUNTER — Telehealth: Payer: Self-pay

## 2021-04-08 DIAGNOSIS — I480 Paroxysmal atrial fibrillation: Secondary | ICD-10-CM

## 2021-04-08 NOTE — Telephone Encounter (Signed)
-----   Message from [place] sent at 03/19/2021  7:43 AM EST ----- Juluis Rainier

## 2021-04-08 NOTE — Telephone Encounter (Signed)
Prescription refill request for Eliquis received. Indication:Afib Last office visit:12/22 Scr:0.9 Age: 88 Weight:80.9 kg  Prescription refilled

## 2021-04-22 DIAGNOSIS — R42 Dizziness and giddiness: Secondary | ICD-10-CM | POA: Diagnosis not present

## 2021-05-25 DIAGNOSIS — L578 Other skin changes due to chronic exposure to nonionizing radiation: Secondary | ICD-10-CM | POA: Diagnosis not present

## 2021-05-25 DIAGNOSIS — C44222 Squamous cell carcinoma of skin of right ear and external auricular canal: Secondary | ICD-10-CM | POA: Diagnosis not present

## 2021-05-25 DIAGNOSIS — C44219 Basal cell carcinoma of skin of left ear and external auricular canal: Secondary | ICD-10-CM | POA: Diagnosis not present

## 2021-05-25 DIAGNOSIS — L570 Actinic keratosis: Secondary | ICD-10-CM | POA: Diagnosis not present

## 2021-06-04 ENCOUNTER — Telehealth: Payer: Self-pay | Admitting: Cardiology

## 2021-06-04 NOTE — Telephone Encounter (Signed)
Called patient to investigate his reports of chest pain. Patient reported getting more and more shaky and short of breath and weak. He was going to shave yesterday when his left arm became numb and he had to sit down. He also had chest pain during this time. He took 2 nitroglycerin and was not sure if the chest pain was resolved or not. When I called today I asked if he was having any chest pain or discomfort and the patient stated that he was. He also has a history of A-fib. Due to the history of A-fib and the current chest pain I recommended that he go to the ER. The patient wanted to make an appointment with Dr. Agustin Cree instead and I urged him to go to the ER. He finally stated that he would go to the ER. ?

## 2021-06-04 NOTE — Telephone Encounter (Signed)
Pt's wife stated that yesterday pt's left arm got "tingly" and numb while shaving. Pt had to sit because he became weak.  ? ?Pt c/o of Chest Pain: STAT if CP now or developed within 24 hours ? ?1. Are you having CP right now? No ? ?2. Are you experiencing any other symptoms (ex. SOB, nausea, vomiting, sweating)? No ? ?3. How long have you been experiencing CP?  ?Yesterday ? ?4. Is your CP continuous or coming and going? Come and go ? ?5. Have you taken Nitroglycerin? Yes - 2 Yesterday ??  ?

## 2021-06-05 DIAGNOSIS — I200 Unstable angina: Secondary | ICD-10-CM | POA: Diagnosis not present

## 2021-06-05 DIAGNOSIS — I10 Essential (primary) hypertension: Secondary | ICD-10-CM | POA: Diagnosis not present

## 2021-06-05 DIAGNOSIS — I4891 Unspecified atrial fibrillation: Secondary | ICD-10-CM | POA: Diagnosis not present

## 2021-06-05 DIAGNOSIS — R079 Chest pain, unspecified: Secondary | ICD-10-CM | POA: Diagnosis not present

## 2021-06-08 ENCOUNTER — Telehealth: Payer: Self-pay | Admitting: Cardiology

## 2021-06-08 NOTE — Telephone Encounter (Signed)
Pt's wife states that pt was seen in the ER over the weekend. Pt's wife states that Hospital wanted pt to take a Stress Test but pt refused. Pt stated he would rather have it done through Dr. Raliegh Ip. Pt's wife would like to know if Dr. Raliegh Ip would place an order for test to be done. Please advise ?

## 2021-06-08 NOTE — Telephone Encounter (Signed)
Appointment rescheduled for 06/10/21. ?

## 2021-06-10 ENCOUNTER — Encounter: Payer: Self-pay | Admitting: Cardiology

## 2021-06-10 ENCOUNTER — Ambulatory Visit: Payer: Medicare HMO | Admitting: Cardiology

## 2021-06-10 VITALS — BP 106/68 | HR 78 | Ht 73.0 in | Wt 169.4 lb

## 2021-06-10 DIAGNOSIS — H52203 Unspecified astigmatism, bilateral: Secondary | ICD-10-CM | POA: Diagnosis not present

## 2021-06-10 DIAGNOSIS — I480 Paroxysmal atrial fibrillation: Secondary | ICD-10-CM

## 2021-06-10 DIAGNOSIS — R0789 Other chest pain: Secondary | ICD-10-CM | POA: Diagnosis not present

## 2021-06-10 DIAGNOSIS — H5212 Myopia, left eye: Secondary | ICD-10-CM | POA: Diagnosis not present

## 2021-06-10 DIAGNOSIS — I4729 Other ventricular tachycardia: Secondary | ICD-10-CM

## 2021-06-10 DIAGNOSIS — F419 Anxiety disorder, unspecified: Secondary | ICD-10-CM

## 2021-06-10 DIAGNOSIS — H5201 Hypermetropia, right eye: Secondary | ICD-10-CM | POA: Diagnosis not present

## 2021-06-10 DIAGNOSIS — Z961 Presence of intraocular lens: Secondary | ICD-10-CM | POA: Diagnosis not present

## 2021-06-10 DIAGNOSIS — H40003 Preglaucoma, unspecified, bilateral: Secondary | ICD-10-CM | POA: Diagnosis not present

## 2021-06-10 DIAGNOSIS — H524 Presbyopia: Secondary | ICD-10-CM | POA: Diagnosis not present

## 2021-06-10 NOTE — H&P (View-Only) (Signed)
?Cardiology Office Note:   ? ?Date:  06/10/2021  ? ?ID:  Wayne Mcguire, DOB 26-Jul-1932, MRN [id number] ? ?PCP:  [name], MD  ?Cardiologist:  [name], MD   ? ?Referring MD: [name], MD  ? ?No chief complaint on file. ?I still have a lot of chest pain ? ?History of Present Illness:   ? ?Wayne Mcguire is a 88 y.o. 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  I have been following him for last few years.  Problem is chest pain also a significant issue is the fact that he is very poor historian he cannot precisely describe characteristic of the pain however lately pain became more frequent pain is relieved by nitroglycerin he is showing pain in the left side of his chest have difficulty grading it.  There is no relieving no aggravating factors.  He did have a stress test in September which showed no evidence of ischemia in spite of that he is still having recurrent chest pain relieved by nitroglycerin.  He is very frustrated his wife is as well.  They want to have an answer if he gets significant heart problem or not so we discussed different option for this situation I ordered that coronary CT angiogram could be suboptimal in quality probably because of calcification that he got for coronary artery therefore I think reached the point that the best option will be to have definite test which will be cardiac catheterization trying to find out if you have significant coronary artery disease.  He also complained of being weak tired and exhausted. ? ?Past Medical History:  ?Diagnosis Date  ? Acute non-recurrent pansinusitis 04/18/2019  ? Adrenal adenoma 09/30/2017  ? Adult situational stress disorder   ? Anxiety   ? Arthritis of left glenohumeral joint 06/20/2019  ? Bladder cancer  ([place])   ? Bloody stool 11/27/2019  ? Chest pain in adult 08/23/2016  ? Stress echo test done 09/05/15 Normal at 7 mets      ? Chronic anticoagulation 11/15/2016  ? Chronic anxiety 09/09/2016  ? Chronic insomnia 09/09/2016  ? Chronic left shoulder pain 06/09/2020  ? Chronic pain of both knees 10/06/2017  ? Chronic pain of both shoulders 09/14/2018  ? Compression fracture of T12 vertebra ([place]) 02/17/2021  ? Constipation 10/02/2018  ? Costochondral chest pain 09/07/2016  ? Cough with hemoptysis 01/06/2017  ? Current mild episode of major depressive disorder ([place]) 09/29/2020  ? Depression   ? Diarrhea 10/02/2018  ? Dysuria 01/06/2017  ? Emphysema lung ([place])   ? Mild  ? Esophagitis   ? GERD (gastroesophageal reflux disease)   ? GERD without esophagitis 09/09/2016  ? H/O total shoulder replacement, 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  ? Hematuria 03/18/2020  ? High risk medication use 08/23/2016  ? Flecanide  ? Hospital discharge follow-up 12/24/2019  ? Hypotension 03/10/2018  ? Left lower quadrant abdominal pain 11/27/2019  ? Low back pain 11/06/2018  ? Lumbar herniated disc   ? Lung nodule < 6cm on CT 09/30/2017  ? Malaise and fatigue 09/30/2017  ? Medicare annual wellness visit, subsequent 09/30/2017  ? Nonsustained ventricular tachycardia ([place])   ? Orchitis 11/06/2018  ? PAF (paroxysmal atrial fibrillation) ([place]) 08/23/2016  ? CHADS2 vasc=2, he has elected not to accept anticoagulation  ? Paroxysmal atrial flutter ([place]) 01/25/2020  ? Penile  bleeding 10/02/2018  ? Penile discharge 11/06/2018  ? Peripheral edema 11/06/2018  ? Persistent atrial fibrillation ([place]) 01/10/2020  ? 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 Productive cough 11/06/2018  ? Seasonal allergies   ? Status post ablation of atrial flutter 04/24/2020  ? Trochanteric bursitis of left hip 10/06/2017  ? Unsteady gait 04/03/2018  ? Ventricular tachycardia ([place]) 03/06/2019  ? Vertigo   ? Vitamin D deficiency 09/30/2017  ? Weight loss 06/09/2020  ? ? ?Past Surgical History:  ?Procedure Laterality Date   ? A-FLUTTER ABLATION N/A 04/03/2020  ? Procedure: A-FLUTTER ABLATION;  Surgeon: Constance Haw, MD;  Location: [place] [facility];  Service: Cardiovascular;  Laterality: N/A;  ? BLADDER SURGERY    ? COLONOSCOPY    ? MOHS SURGERY Right 06/18/2020  ? ear  ? SKIN SURGERY Left 06/25/2020  ? hand  ? Synchronous Cardioversion  01/16/2020  ? TOTAL SHOULDER ARTHROPLASTY Left 06/20/2019  ? Procedure: TOTAL SHOULDER ARTHROPLASTY;  Surgeon: Hiram Gash, MD;  Location: [facility];  Service: Orthopedics;  Laterality: Left;  ? VEIN SURGERY Left   ? ? ?Current Medications: ?Current Meds  ?Medication Sig  ? acetaminophen (TYLENOL) 500 MG tablet Take 500 mg by mouth in the morning and at bedtime.  ? CRANBERRY PO Take 1 tablet by mouth daily. Unknown strength  ? Dextromethorphan-guaiFENesin (MUCINEX DM PO) Take 1 tablet by mouth 2 (two) times daily.  ? ELIQUIS 5 MG TABS tablet TAKE 1 TABLET BY MOUTH 2 TIMES DAILY  ? furosemide (LASIX) 20 MG tablet Take 20 mg by mouth daily as needed for fluid.  ? magnesium oxide (MAG-OX) 400 MG tablet Take 400 mg by mouth daily.  ? mirtazapine (REMERON) 15 MG tablet Take 15 mg by mouth at bedtime.  ? Multiple Vitamin (MULTIVITAMIN ADULT PO) Take 1 tablet by mouth in the morning and at bedtime. Unknown strength  ? Multiple Vitamins-Minerals (HAIR SKIN AND NAILS FORMULA) TABS Take 1 tablet by mouth daily. Unknown strength  ? Multiple Vitamins-Minerals (ICAPS AREDS 2 PO) Take 2 capsules by mouth daily. Unknown strength  ? nitroGLYCERIN (NITROSTAT) 0.4 MG SL tablet Place 1 tablet (0.4 mg total) under the tongue every 5 (five) minutes as needed for chest pain.  ? olopatadine (PATANOL) 0.1 % ophthalmic solution Place 1 drop into both eyes daily as needed for allergies.  ? Peppermint Oil (IBGARD) 90 MG CPCR Take 1 capsule by mouth in the morning and at bedtime.  ? Probiotic Product (PROBIOTIC DAILY) CAPS Take 1 capsule by mouth daily. Unknown strength  ? traMADol (ULTRAM) 50 MG tablet Take 50 mg by mouth  every 12 (twelve) hours as needed for severe pain.  ? traZODone (DESYREL) 150 MG tablet Take 150 mg by mouth at bedtime.   ?  ? ?Allergies:   Terbinafine and related  ? ?Social History  ? ?Socioeconomic History  ? Marital status: Married  ?  Spouse name: Not on file  ? Number of children: Not on file  ? Years of education: Not on file  ? Highest education level: Not on file  ?Occupational History  ? Not on file  ?Tobacco Use  ? Smoking status: Former  ?  Packs/day: 1.00  ?  Years: 70.00  ?  Pack years: 70.00  ?  Types: Cigarettes  ?  Quit date: 04/2018  ?  Years since quitting: 3.1  ? Smokeless tobacco: Never  ?Vaping Use  ? Vaping Use: Never used  ?Substance and Sexual Activity  ?  Alcohol use: No  ? Drug use: No  ? Sexual activity: Not on file  ?Other Topics Concern  ? Not on file  ?Social History Narrative  ? Not on file  ? ?Social Determinants of Health  ? ?Financial Resource Strain: Not on file  ?Food Insecurity: Not on file  ?Transportation Needs: Not on file  ?Physical Activity: Not on file  ?Stress: Not on file  ?Social Connections: Not on file  ?  ? ?Family History: ?The patient's family history includes Heart attack in his brother; Heart failure in his father. ?ROS:   ?Please see the history of present illness.    ?All 14 point review of systems negative except as described per history of present illness ? ?EKGs/Labs/Other Studies Reviewed:   ? ? ? ?Recent Labs: ?No results found for requested labs within last 8760 hours.  ?Recent Lipid Panel ?No results found for: CHOL, TRIG, HDL, CHOLHDL, VLDL, LDLCALC, LDLDIRECT ? ?Physical Exam:   ? ?VS:  BP 106/68   Pulse 78   Ht '6\' 1"'$  (1.854 m)   Wt 169 lb 6.4 oz (76.8 kg)   SpO2 95%   BMI 22.35 kg/m?    ? ?Wt Readings from Last 3 Encounters:  ?06/10/21 169 lb 6.4 oz (76.8 kg)  ?01/29/21 178 lb 6.4 oz (80.9 kg)  ?11/04/20 179 lb (81.2 kg)  ?  ? ?GEN:  Well nourished, well developed in no acute distress ?HEENT: Normal ?NECK: No JVD; No carotid bruits ?LYMPHATICS:  No lymphadenopathy ?CARDIAC: RRR, no murmurs, no rubs, no gallops ?RESPIRATORY:  Clear to auscultation without rales, wheezing or rhonchi  ?ABDOMEN: Soft, non-tender, non-distended ?MUSCULOSKELETAL:  No e

## 2021-06-10 NOTE — Patient Instructions (Signed)
Medication Instructions:  ?Your physician has recommended you make the following change in your medication:  ? ?Take 81 mg coated aspirin daily. ?Use nitroglycerin as needed for chest pain. ? ?*If you need a refill on your cardiac medications before your next appointment, please call your pharmacy* ? ? ?Lab Work: ?Your physician recommends that you have a BMET and CBC today in the office for your upcoming procedure. ? ?If you have labs (blood work) drawn today and your tests are completely normal, you will receive your results only by: ?MyChart Message (if you have MyChart) OR ?A paper copy in the mail ?If you have any lab test that is abnormal or we need to change your treatment, we will call you to review the results. ? ? ?Testing/Procedures: ? ?[facility] ?[place] ?[place] [address] ?Dept: [phone] ?Loc: [phone] ? ?JARETH PARDEE  06/10/2021 ? ?You are scheduled for a Cardiac Catheterization on Tuesday, April 25 with Dr. [name]. ? ?1. Please arrive at the Main Entrance A at [facility]: [place], [place] [address] at 8:30 AM (This time is two hours before your procedure to ensure your preparation). Free valet parking service is available.  ? ?Special note: Every effort is made to have your procedure done on time. Please understand that emergencies sometimes delay scheduled procedures. ? ?2. Diet: Do not eat solid foods after midnight.  You may have clear liquids until 5 AM upon the day of the procedure. ? ?3. Labs: You had your labs done today in the office. ? ?4. Medication instructions in preparation for your procedure: ? ? Contrast Allergy: No ? ?Stop taking Eliquis (Apixiban) on Sunday, April 23. ? ?Stop taking, Lasix (Furosemide)  Tuesday, April 25,, Tramadol Tuesday, April 25, ? ?On the morning of your procedure, take Aspirin and any morning medicines NOT listed above.  You  may use sips of water. ? ?5. Plan to go home the same day, you will only stay overnight if medically necessary. ?6. You MUST have a responsible adult to drive you home. ?7. An adult MUST be with you the first 24 hours after you arrive home. ?8. Bring a current list of your medications, and the last time and date medication taken. ?9. Bring ID and current insurance cards. ?10.Please wear clothes that are easy to get on and off and wear slip-on shoes. ? ?Thank you for allowing [place] to care for you! ?  -- [facility] ? ? ? ?Follow-Up: ?At [facility], you and your health needs are our priority.  As part of our continuing mission to provide you with exceptional heart care, we have created designated Provider Care Teams.  These Care Teams include your primary Cardiologist (physician) and Advanced Practice Providers (APPs -  Physician Assistants and Nurse Practitioners) who all work together to provide you with the care you need, when you need it. ? ?We recommend signing up for the patient portal called "MyChart".  Sign up information is provided on this After Visit Summary.  MyChart is used to connect with patients for Virtual Visits (Telemedicine).  Patients are able to view lab/test results, encounter notes, upcoming appointments, etc.  Non-urgent messages can be sent to your provider as well.   ?To learn more about what you can do with MyChart, go to [email or web address].   ? ?Your next appointment:   ?1 month(s) ? ?The format for your next appointment:   ?  In Person ? ?Provider:   ?[name], MD ? ? ?Other Instructions ? ?Coronary Angiogram With Stent ?Coronary angiogram with stent placement is a procedure to widen or open a narrow blood vessel of the heart (coronary artery). Arteries may become blocked by cholesterol buildup (plaques) in the lining of the artery wall. When a coronary artery becomes partially blocked, blood flow to that area decreases. This may lead to chest  pain or a heart attack (myocardial infarction). ?A stent is a small piece of metal that looks like mesh or spring. Stent placement may be done as treatment after a heart attack, or to prevent a heart attack if a blocked artery is found by a coronary angiogram. ?Let your health care provider know about: ?Any allergies you have, including allergies to medicines or contrast dye. ?All medicines you are taking, including vitamins, herbs, eye drops, creams, and over-the-counter medicines. ?Any problems you or family members have had with anesthetic medicines. ?Any blood disorders you have. ?Any surgeries you have had. ?Any medical conditions you have, including kidney problems or kidney failure. ?Whether you are pregnant or may be pregnant. ?Whether you are breastfeeding. ?What are the risks? ?Generally, this is a safe procedure. However, serious problems may occur, including: ?Damage to nearby structures or organs, such as the heart, blood vessels, or kidneys. ?A return of blockage. ?Bleeding, infection, or bruising at the insertion site. ?A collection of blood under the skin (hematoma) at the insertion site. ?A blood clot in another part of the body. ?Allergic reaction to medicines or dyes. ?Bleeding into the abdomen (retroperitoneal bleeding). ?Stroke (rare). ?Heart attack (rare). ?What happens before the procedure? ?Staying hydrated ?Follow instructions from your health care provider about hydration, which may include: ?Up to 2 hours before the procedure - you may continue to drink clear liquids, such as water, clear fruit juice, black coffee, and plain tea.  ?  ?Eating and drinking restrictions ?Follow instructions from your health care provider about eating and drinking, which may include: ?8 hours before the procedure - stop eating heavy meals or foods, such as meat, fried foods, or fatty foods. ?6 hours before the procedure - stop eating light meals or foods, such as toast or cereal. ?2 hours before the procedure  - stop drinking clear liquids. ?Medicines ?Ask your health care provider about: ?Changing or stopping your regular medicines. This is especially important if you are taking diabetes medicines or blood thinners. ?Taking medicines such as aspirin and ibuprofen. These medicines can thin your blood. Do not take these medicines unless your health care provider tells you to take them. ?Generally, aspirin is recommended before a thin tube, called a catheter, is passed through a blood vessel and inserted into the heart (cardiac catheterization). ?Taking over-the-counter medicines, vitamins, herbs, and supplements. ?General instructions ?Do not use any products that contain nicotine or tobacco for at least 4 weeks before the procedure. These products include cigarettes, e-cigarettes, and chewing tobacco. If you need help quitting, ask your health care provider. ?Plan to have someone take you home from the hospital or clinic. ?If you will be going home right after the procedure, plan to have someone with you for 24 hours. ?You may have tests and imaging procedures. ?Ask your health care provider: ?How your insertion site will be marked. Ask which artery will be used for the procedure. ?What steps will be taken to help prevent infection. These may include: ?Removing hair at the insertion site. ?Washing skin with a germ-killing soap. ?Taking antibiotic medicine. ?  What happens during the procedure? ?An IV will be inserted into one of your veins. ?Electrodes may be placed on your chest to monitor your heart rate during the procedure. ?You will be given one or more of the following: ?A medicine to help you relax (sedative). ?A medicine to numb the area (local anesthetic) for catheter insertion. ?A small incision will be made for catheter insertion. ?The catheter will be inserted into an artery using a guide wire. The location may be in your groin, your wrist, or the fold of your arm (near your elbow). ?An X-ray procedure  (fluoroscopy) will be used to help guide the catheter to the opening of the heart arteries. ?A dye will be injected into the catheter. X-rays will be taken. The dye helps to show where any narrowing or blocka

## 2021-06-10 NOTE — Progress Notes (Signed)
?Cardiology Office Note:   ? ?Date:  06/10/2021  ? ?ID:  Wayne Mcguire, DOB [birth year], MRN [id number] ? ?PCP:  [name], MD  ?Cardiologist:  [name], MD   ? ?Referring MD: [name], MD  ? ?No chief complaint on file. ?I still have a lot of chest pain ? ?History of Present Illness:   ? ?Wayne Mcguire is a 88 y.o. 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  I have been following him for last few years.  Problem is chest pain also a significant issue is the fact that he is very poor historian he cannot precisely describe characteristic of the pain however lately pain became more frequent pain is relieved by nitroglycerin he is showing pain in the left side of his chest have difficulty grading it.  There is no relieving no aggravating factors.  He did have a stress test in September which showed no evidence of ischemia in spite of that he is still having recurrent chest pain relieved by nitroglycerin.  He is very frustrated his wife is as well.  They want to have an answer if he gets significant heart problem or not so we discussed different option for this situation I ordered that coronary CT angiogram could be suboptimal in quality probably because of calcification that he got for coronary artery therefore I think reached the point that the best option will be to have definite test which will be cardiac catheterization trying to find out if you have significant coronary artery disease.  He also complained of being weak tired and exhausted. ? ?Past Medical History:  ?Diagnosis Date  ? Acute non-recurrent pansinusitis 04/18/2019  ? Adrenal adenoma 09/30/2017  ? Adult situational stress disorder   ? Anxiety   ? Arthritis of left glenohumeral joint 06/20/2019  ? Bladder cancer  ([place])   ? Bloody stool 11/27/2019  ? Chest pain in adult 08/23/2016  ? Stress echo test done 09/05/15 Normal at 7 mets      ? Chronic anticoagulation 11/15/2016  ? Chronic anxiety 09/09/2016  ? Chronic insomnia 09/09/2016  ? Chronic left shoulder pain 06/09/2020  ? Chronic pain of both knees 10/06/2017  ? Chronic pain of both shoulders 09/14/2018  ? Compression fracture of T12 vertebra ([place]) 02/17/2021  ? Constipation 10/02/2018  ? Costochondral chest pain 09/07/2016  ? Cough with hemoptysis 01/06/2017  ? Current mild episode of major depressive disorder ([place]) 09/29/2020  ? Depression   ? Diarrhea 10/02/2018  ? Dysuria 01/06/2017  ? Emphysema lung ([place])   ? Mild  ? Esophagitis   ? GERD (gastroesophageal reflux disease)   ? GERD without esophagitis 09/09/2016  ? H/O total shoulder replacement, 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  ? Hematuria 03/18/2020  ? High risk medication use 08/23/2016  ? Flecanide  ? Hospital discharge follow-up 12/24/2019  ? Hypotension 03/10/2018  ? Left lower quadrant abdominal pain 11/27/2019  ? Low back pain 11/06/2018  ? Lumbar herniated disc   ? Lung nodule < 6cm on CT 09/30/2017  ? Malaise and fatigue 09/30/2017  ? Medicare annual wellness visit, subsequent 09/30/2017  ? Nonsustained ventricular tachycardia ([place])   ? Orchitis 11/06/2018  ? PAF (paroxysmal atrial fibrillation) ([place]) 08/23/2016  ? CHADS2 vasc=2, he has elected not to accept anticoagulation  ? Paroxysmal atrial flutter ([place]) 01/25/2020  ? Penile  bleeding 10/02/2018  ? Penile discharge 11/06/2018  ? Peripheral edema 11/06/2018  ? Persistent atrial fibrillation ([place]) 01/10/2020  ? 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 Productive cough 11/06/2018  ? Seasonal allergies   ? Status post ablation of atrial flutter 04/24/2020  ? Trochanteric bursitis of left hip 10/06/2017  ? Unsteady gait 04/03/2018  ? Ventricular tachycardia ([place]) 03/06/2019  ? Vertigo   ? Vitamin D deficiency 09/30/2017  ? Weight loss 06/09/2020  ? ? ?Past Surgical History:  ?Procedure Laterality Date   ? A-FLUTTER ABLATION N/A 04/03/2020  ? Procedure: A-FLUTTER ABLATION;  Surgeon: Constance Haw, MD;  Location: [facility];  Service: Cardiovascular;  Laterality: N/A;  ? BLADDER SURGERY    ? COLONOSCOPY    ? MOHS SURGERY Right 06/18/2020  ? ear  ? SKIN SURGERY Left 06/25/2020  ? hand  ? Synchronous Cardioversion  01/16/2020  ? TOTAL SHOULDER ARTHROPLASTY Left 06/20/2019  ? Procedure: TOTAL SHOULDER ARTHROPLASTY;  Surgeon: Hiram Gash, MD;  Location: [facility];  Service: Orthopedics;  Laterality: Left;  ? VEIN SURGERY Left   ? ? ?Current Medications: ?Current Meds  ?Medication Sig  ? acetaminophen (TYLENOL) 500 MG tablet Take 500 mg by mouth in the morning and at bedtime.  ? CRANBERRY PO Take 1 tablet by mouth daily. Unknown strength  ? Dextromethorphan-guaiFENesin (MUCINEX DM PO) Take 1 tablet by mouth 2 (two) times daily.  ? ELIQUIS 5 MG TABS tablet TAKE 1 TABLET BY MOUTH 2 TIMES DAILY  ? furosemide (LASIX) 20 MG tablet Take 20 mg by mouth daily as needed for fluid.  ? magnesium oxide (MAG-OX) 400 MG tablet Take 400 mg by mouth daily.  ? mirtazapine (REMERON) 15 MG tablet Take 15 mg by mouth at bedtime.  ? Multiple Vitamin (MULTIVITAMIN ADULT PO) Take 1 tablet by mouth in the morning and at bedtime. Unknown strength  ? Multiple Vitamins-Minerals (HAIR SKIN AND NAILS FORMULA) TABS Take 1 tablet by mouth daily. Unknown strength  ? Multiple Vitamins-Minerals (ICAPS AREDS 2 PO) Take 2 capsules by mouth daily. Unknown strength  ? nitroGLYCERIN (NITROSTAT) 0.4 MG SL tablet Place 1 tablet (0.4 mg total) under the tongue every 5 (five) minutes as needed for chest pain.  ? olopatadine (PATANOL) 0.1 % ophthalmic solution Place 1 drop into both eyes daily as needed for allergies.  ? Peppermint Oil (IBGARD) 90 MG CPCR Take 1 capsule by mouth in the morning and at bedtime.  ? Probiotic Product (PROBIOTIC DAILY) CAPS Take 1 capsule by mouth daily. Unknown strength  ? traMADol (ULTRAM) 50 MG tablet Take 50 mg by mouth  every 12 (twelve) hours as needed for severe pain.  ? traZODone (DESYREL) 150 MG tablet Take 150 mg by mouth at bedtime.   ?  ? ?Allergies:   Terbinafine and related  ? ?Social History  ? ?Socioeconomic History  ? Marital status: Married  ?  Spouse name: Not on file  ? Number of children: Not on file  ? Years of education: Not on file  ? Highest education level: Not on file  ?Occupational History  ? Not on file  ?Tobacco Use  ? Smoking status: Former  ?  Packs/day: 1.00  ?  Years: 70.00  ?  Pack years: 70.00  ?  Types: Cigarettes  ?  Quit date: 04/2018  ?  Years since quitting: 3.1  ? Smokeless tobacco: Never  ?Vaping Use  ? Vaping Use: Never used  ?Substance and Sexual Activity  ?  Alcohol use: No  ? Drug use: No  ? Sexual activity: Not on file  ?Other Topics Concern  ? Not on file  ?Social History Narrative  ? Not on file  ? ?Social Determinants of Health  ? ?Financial Resource Strain: Not on file  ?Food Insecurity: Not on file  ?Transportation Needs: Not on file  ?Physical Activity: Not on file  ?Stress: Not on file  ?Social Connections: Not on file  ?  ? ?Family History: ?The patient's family history includes Heart attack in his brother; Heart failure in his father. ?ROS:   ?Please see the history of present illness.    ?All 14 point review of systems negative except as described per history of present illness ? ?EKGs/Labs/Other Studies Reviewed:   ? ? ? ?Recent Labs: ?No results found for requested labs within last 8760 hours.  ?Recent Lipid Panel ?No results found for: CHOL, TRIG, HDL, CHOLHDL, VLDL, LDLCALC, LDLDIRECT ? ?Physical Exam:   ? ?VS:  BP 106/68   Pulse 78   Ht '6\' 1"'$  (1.854 m)   Wt 169 lb 6.4 oz (76.8 kg)   SpO2 95%   BMI 22.35 kg/m?    ? ?Wt Readings from Last 3 Encounters:  ?06/10/21 169 lb 6.4 oz (76.8 kg)  ?01/29/21 178 lb 6.4 oz (80.9 kg)  ?11/04/20 179 lb (81.2 kg)  ?  ? ?GEN:  Well nourished, well developed in no acute distress ?HEENT: Normal ?NECK: No JVD; No carotid bruits ?LYMPHATICS:  No lymphadenopathy ?CARDIAC: RRR, no murmurs, no rubs, no gallops ?RESPIRATORY:  Clear to auscultation without rales, wheezing or rhonchi  ?ABDOMEN: Soft, non-tender, non-distended ?MUSCULOSKELETAL:  No e

## 2021-06-11 LAB — CBC WITH DIFFERENTIAL/PLATELET
Basophils Absolute: 0.1 10*3/uL (ref 0.0–0.2)
Basos: 0 %
EOS (ABSOLUTE): 0.4 10*3/uL (ref 0.0–0.4)
Eos: 3 %
Hematocrit: 39.7 % (ref 37.5–51.0)
Hemoglobin: 13.2 g/dL (ref 13.0–17.7)
Immature Grans (Abs): 0 10*3/uL (ref 0.0–0.1)
Immature Granulocytes: 0 %
Lymphocytes Absolute: 1.7 10*3/uL (ref 0.7–3.1)
Lymphs: 13 %
MCH: 31.1 pg (ref 26.6–33.0)
MCHC: 33.2 g/dL (ref 31.5–35.7)
MCV: 94 fL (ref 79–97)
Monocytes Absolute: 1.4 10*3/uL — ABNORMAL HIGH (ref 0.1–0.9)
Monocytes: 10 %
Neutrophils Absolute: 9.8 10*3/uL — ABNORMAL HIGH (ref 1.4–7.0)
Neutrophils: 74 %
Platelets: 288 10*3/uL (ref 150–450)
RBC: 4.24 x10E6/uL (ref 4.14–5.80)
RDW: 13 % (ref 11.6–15.4)
WBC: 13.3 10*3/uL — ABNORMAL HIGH (ref 3.4–10.8)

## 2021-06-11 LAB — BASIC METABOLIC PANEL
BUN/Creatinine Ratio: 16 (ref 10–24)
BUN: 16 mg/dL (ref 8–27)
CO2: 26 mmol/L (ref 20–29)
Calcium: 10.5 mg/dL — ABNORMAL HIGH (ref 8.6–10.2)
Chloride: 102 mmol/L (ref 96–106)
Creatinine, Ser: 1 mg/dL (ref 0.76–1.27)
Glucose: 94 mg/dL (ref 70–99)
Potassium: 4.1 mmol/L (ref 3.5–5.2)
Sodium: 140 mmol/L (ref 134–144)
eGFR: 73 mL/min/{1.73_m2} (ref >59)

## 2021-06-15 ENCOUNTER — Telehealth: Payer: Self-pay | Admitting: *Deleted

## 2021-06-15 NOTE — Telephone Encounter (Addendum)
Cardiac Catheterization scheduled at [facility] for: Tuesday June 16, 2021 10:30 AM ?Arrival time and place: [facility] Main Entrance A at: 8:30 AM ? ? ?No solid food after midnight prior to cath, clear liquids until 5 AM day of procedure. ? ?Medication instructions: ?-Hold: ? Eliquis-none 06/14/21 until post procedure ? Lasix-AM of procedure ?-Except hold medications usual morning medications can be taken with sips of water including aspirin 81 mg. ? ?Confirmed patient has responsible adult to drive home post procedure and be with patient first 24 hours after arriving home. ? ?Patient reports no new symptoms concerning for COVID-19/no exposure to COVID-19 in the past 10 days. ? ?Reviewed procedure instructions with patient's wife (DPR).  ? ?

## 2021-06-15 NOTE — Telephone Encounter (Signed)
Reviewed procedure instructions with patient's wife (DPR).  

## 2021-06-15 NOTE — Telephone Encounter (Signed)
No answer, voicemail message. 

## 2021-06-16 ENCOUNTER — Other Ambulatory Visit: Payer: Self-pay

## 2021-06-16 ENCOUNTER — Encounter (HOSPITAL_COMMUNITY)
Admission: RE | Disposition: A | Discharge status: Home / Self Care | Payer: Self-pay | Source: Home / Self Care | Attending: Cardiovascular Disease

## 2021-06-16 ENCOUNTER — Ambulatory Visit (HOSPITAL_COMMUNITY)
Admission: RE | Admit: 2021-06-16 | Discharge: 2021-06-16 | Disposition: A | Discharge status: Home / Self Care | Payer: Medicare HMO | Attending: Cardiovascular Disease | Admitting: Cardiovascular Disease

## 2021-06-16 DIAGNOSIS — I480 Paroxysmal atrial fibrillation: Secondary | ICD-10-CM | POA: Diagnosis not present

## 2021-06-16 DIAGNOSIS — F419 Anxiety disorder, unspecified: Secondary | ICD-10-CM | POA: Insufficient documentation

## 2021-06-16 DIAGNOSIS — Z87891 Personal history of nicotine dependence: Secondary | ICD-10-CM | POA: Insufficient documentation

## 2021-06-16 DIAGNOSIS — Z79899 Other long term (current) drug therapy: Secondary | ICD-10-CM | POA: Diagnosis not present

## 2021-06-16 DIAGNOSIS — R079 Chest pain, unspecified: Secondary | ICD-10-CM

## 2021-06-16 DIAGNOSIS — I472 Ventricular tachycardia, unspecified: Secondary | ICD-10-CM | POA: Diagnosis not present

## 2021-06-16 DIAGNOSIS — I25119 Atherosclerotic heart disease of native coronary artery with unspecified angina pectoris: Secondary | ICD-10-CM | POA: Insufficient documentation

## 2021-06-16 DIAGNOSIS — I10 Essential (primary) hypertension: Secondary | ICD-10-CM | POA: Diagnosis not present

## 2021-06-16 DIAGNOSIS — I2510 Atherosclerotic heart disease of native coronary artery without angina pectoris: Secondary | ICD-10-CM

## 2021-06-16 DIAGNOSIS — Z933 Colostomy status: Secondary | ICD-10-CM | POA: Insufficient documentation

## 2021-06-16 HISTORY — PX: LEFT HEART CATH AND CORONARY ANGIOGRAPHY: CATH118249

## 2021-06-16 SURGERY — LEFT HEART CATH AND CORONARY ANGIOGRAPHY
Anesthesia: LOCAL

## 2021-06-16 MED ORDER — VERAPAMIL HCL 2.5 MG/ML IV SOLN
INTRAVENOUS | Status: AC
  Filled 2021-06-16: qty 2

## 2021-06-16 MED ORDER — SODIUM CHLORIDE 0.9% FLUSH: 3.0000 mL | INTRAVENOUS | Status: DC | PRN

## 2021-06-16 MED ORDER — HEPARIN (PORCINE) IN NACL 1000-0.9 UT/500ML-% IV SOLN
INTRAVENOUS | Status: AC
  Filled 2021-06-16: qty 500

## 2021-06-16 MED ORDER — SODIUM CHLORIDE 0.9% FLUSH: 3.0000 mL | Freq: Two times a day (BID) | INTRAVENOUS | Status: DC

## 2021-06-16 MED ORDER — SODIUM CHLORIDE 0.9 % IV SOLN: INTRAVENOUS | Status: AC

## 2021-06-16 MED ORDER — VERAPAMIL HCL 2.5 MG/ML IV SOLN
INTRAVENOUS | Status: DC | PRN
  Administered 2021-06-16: 10 mL via INTRA_ARTERIAL

## 2021-06-16 MED ORDER — FENTANYL CITRATE (PF) 100 MCG/2ML IJ SOLN
INTRAMUSCULAR | Status: DC | PRN
  Administered 2021-06-16: 25 ug via INTRAVENOUS

## 2021-06-16 MED ORDER — IOHEXOL 350 MG/ML SOLN
INTRAVENOUS | Status: DC | PRN
  Administered 2021-06-16: 80 mL

## 2021-06-16 MED ORDER — ACETAMINOPHEN 325 MG PO TABS: 650.0000 mg | ORAL_TABLET | ORAL | Status: DC | PRN

## 2021-06-16 MED ORDER — LABETALOL HCL 5 MG/ML IV SOLN: 10.0000 mg | INTRAVENOUS | Status: DC | PRN

## 2021-06-16 MED ORDER — LIDOCAINE HCL (PF) 1 % IJ SOLN
INTRAMUSCULAR | Status: DC | PRN
  Administered 2021-06-16: 5 mL

## 2021-06-16 MED ORDER — SODIUM CHLORIDE 0.9 % WEIGHT BASED INFUSION
3.0000 mL/kg/h | INTRAVENOUS | Status: AC
  Administered 2021-06-16: 3 mL/kg/h via INTRAVENOUS

## 2021-06-16 MED ORDER — SODIUM CHLORIDE 0.9 % WEIGHT BASED INFUSION: 1.0000 mL/kg/h | INTRAVENOUS | Status: DC

## 2021-06-16 MED ORDER — SODIUM CHLORIDE 0.9 % IV SOLN: 250.0000 mL | INTRAVENOUS | Status: DC | PRN

## 2021-06-16 MED ORDER — LIDOCAINE HCL (PF) 1 % IJ SOLN
INTRAMUSCULAR | Status: AC
  Filled 2021-06-16: qty 30

## 2021-06-16 MED ORDER — ONDANSETRON HCL 4 MG/2ML IJ SOLN: 4.0000 mg | Freq: Four times a day (QID) | INTRAMUSCULAR | Status: DC | PRN

## 2021-06-16 MED ORDER — HEPARIN (PORCINE) IN NACL 1000-0.9 UT/500ML-% IV SOLN
INTRAVENOUS | Status: DC | PRN
  Administered 2021-06-16 (×2): 500 mL

## 2021-06-16 MED ORDER — HEPARIN SODIUM (PORCINE) 1000 UNIT/ML IJ SOLN
INTRAMUSCULAR | Status: AC
  Filled 2021-06-16: qty 10

## 2021-06-16 MED ORDER — MIDAZOLAM HCL 2 MG/2ML IJ SOLN
INTRAMUSCULAR | Status: AC
  Filled 2021-06-16: qty 2

## 2021-06-16 MED ORDER — FENTANYL CITRATE (PF) 100 MCG/2ML IJ SOLN
INTRAMUSCULAR | Status: AC
  Filled 2021-06-16: qty 2

## 2021-06-16 MED ORDER — MIDAZOLAM HCL 2 MG/2ML IJ SOLN
INTRAMUSCULAR | Status: DC | PRN
  Administered 2021-06-16: 1 mg via INTRAVENOUS

## 2021-06-16 MED ORDER — ASPIRIN 81 MG PO CHEW: 81.0000 mg | CHEWABLE_TABLET | ORAL | Status: DC

## 2021-06-16 MED ORDER — HYDRALAZINE HCL 20 MG/ML IJ SOLN: 10.0000 mg | INTRAMUSCULAR | Status: DC | PRN

## 2021-06-16 MED ORDER — HEPARIN SODIUM (PORCINE) 1000 UNIT/ML IJ SOLN
INTRAMUSCULAR | Status: DC | PRN
Start: 2021-06-16 — End: 2021-06-16
  Administered 2021-06-16: 4000 [IU] via INTRAVENOUS

## 2021-06-16 SURGICAL SUPPLY — 9 items

## 2021-06-16 NOTE — Discharge Instructions (Signed)
Resume Eliquis tomorrow (06/17/21) if no bleeding from right wrist cath site.  ?

## 2021-06-16 NOTE — Interval H&P Note (Signed)
History and Physical Interval Note: ? ?06/16/2021 ?10:01 AM ? ?Wayne Mcguire  has presented today for surgery, with the diagnosis of angina.  The various methods of treatment have been discussed with the patient and family. After consideration of risks, benefits and other options for treatment, the patient has consented to  Procedure(s): ?LEFT HEART CATH AND CORONARY ANGIOGRAPHY (N/A) as a surgical intervention.  The patient's history has been reviewed, patient examined, no change in status, stable for surgery.  I have reviewed the patient's chart and labs.  Questions were answered to the patient's satisfaction.   ? ?Cath Lab Visit (complete for each Cath Lab visit) ? ?Clinical Evaluation Leading to the Procedure:  ? ?ACS: No. ? ?Non-ACS:   ? ?Anginal Classification: CCS III ? ?Anti-ischemic medical therapy: Minimal Therapy (1 class of medications) ? ?Non-Invasive Test Results: No non-invasive testing performed ? ?Prior CABG: No previous CABG ? ? ? ? ? ? ? ?Wayne Mcguire ? ? ?

## 2021-06-17 ENCOUNTER — Encounter (HOSPITAL_COMMUNITY): Payer: Self-pay | Admitting: Cardiovascular Disease

## 2021-06-18 DIAGNOSIS — C44229 Squamous cell carcinoma of skin of left ear and external auricular canal: Secondary | ICD-10-CM | POA: Diagnosis not present

## 2021-06-18 DIAGNOSIS — C44222 Squamous cell carcinoma of skin of right ear and external auricular canal: Secondary | ICD-10-CM | POA: Diagnosis not present

## 2021-06-30 ENCOUNTER — Ambulatory Visit: Payer: Medicare HMO | Admitting: Cardiology

## 2021-07-06 ENCOUNTER — Encounter: Payer: Self-pay | Admitting: Cardiology

## 2021-07-06 ENCOUNTER — Ambulatory Visit: Payer: Medicare HMO | Admitting: Cardiology

## 2021-07-06 VITALS — BP 94/58 | HR 57 | Ht 73.0 in | Wt 176.0 lb

## 2021-07-06 DIAGNOSIS — M79601 Pain in right arm: Secondary | ICD-10-CM

## 2021-07-06 DIAGNOSIS — E861 Hypovolemia: Secondary | ICD-10-CM | POA: Diagnosis not present

## 2021-07-06 DIAGNOSIS — R0789 Other chest pain: Secondary | ICD-10-CM | POA: Diagnosis not present

## 2021-07-06 DIAGNOSIS — I9589 Other hypotension: Secondary | ICD-10-CM | POA: Diagnosis not present

## 2021-07-06 DIAGNOSIS — I480 Paroxysmal atrial fibrillation: Secondary | ICD-10-CM | POA: Diagnosis not present

## 2021-07-06 MED ORDER — MIDODRINE HCL 2.5 MG PO TABS: ORAL_TABLET | ORAL | 6 refills | Status: DC

## 2021-07-06 NOTE — Progress Notes (Signed)
?Cardiology Office Note:   ? ?Date:  07/06/2021  ? ?ID:  Wayne Mcguire, DOB 08/26/1932, MRN [id number] ? ?PCP:  [name], MD  ?Cardiologist:  [name], MD   ? ?Referring MD: [name], MD  ? ?Chief Complaint  ?Patient presents with  ? follo up cath  ? ? ?History of Present Illness:   ? ?Wayne Mcguire is a 88 y.o. male with past medical history significant for paroxysmal atrial fibrillation, atrial flutter, status post atrial flutter ablation done in February 2022, essential hypertension, nonsustained ventricular tachycardia he has been coming to me because of atypical symptoms including chest pain.  Quite extensive evaluation has been performed stress test negative in spite of that still having symptoms quite worsening.  Eventually cardiac catheterization was done which showed hemodynamically insignificant lesion that clearly does not explain his symptomatology.  He comes today 2 months of follow-up overall cardiac wise seems to be doing better.  He was given some proton pump inhibitor which seems to be helping with his symptomatology, concern however is about pain in his right hand after cardiac catheterization puncture wound after cardiac catheterization healed well, he apparently went to urgent care and they gave him antibiotic for potential infection however I do not see much signs and symptoms of it.  Capillary refill is very brisk I have difficulty palpating ulnar artery however radial seems to be good.  Again capillary refill is good.  Hand is nice and warm.  He does not have any pain right now but he said he was complaining of having pain earlier this morning.  On top of that he complains also of having dizziness weakness fatigue with low blood pressure. ? ?Past Medical History:  ?Diagnosis Date  ? Acute non-recurrent pansinusitis 04/18/2019  ? Adrenal adenoma 09/30/2017  ? Adult situational stress disorder   ? Anxiety   ? Arthritis of left glenohumeral joint 06/20/2019  ? Bladder cancer  ([place])   ? Bloody stool 11/27/2019  ? Chest pain in adult 08/23/2016  ? Stress echo test done 09/05/15 Normal at 7 mets      ? Chronic anticoagulation 11/15/2016  ? Chronic anxiety 09/09/2016  ? Chronic insomnia 09/09/2016  ? Chronic left shoulder pain 06/09/2020  ? Chronic pain of both knees 10/06/2017  ? Chronic pain of both shoulders 09/14/2018  ? Compression fracture of T12 vertebra ([place]) 02/17/2021  ? Constipation 10/02/2018  ? Costochondral chest pain 09/07/2016  ? Cough with hemoptysis 01/06/2017  ? Current mild episode of major depressive disorder ([place]) 09/29/2020  ? Depression   ? Diarrhea 10/02/2018  ? Dysuria 01/06/2017  ? Emphysema lung ([place])   ? Mild  ? Esophagitis   ? GERD (gastroesophageal reflux disease)   ? GERD without esophagitis 09/09/2016  ? H/O total shoulder replacement, 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  ? Hematuria 03/18/2020  ? High risk medication use 08/23/2016  ? Flecanide  ? Hospital discharge follow-up 12/24/2019  ? Hypotension 03/10/2018  ? Left lower quadrant abdominal pain 11/27/2019  ? Low back pain 11/06/2018  ? Lumbar herniated disc   ? Lung nodule < 6cm on CT 09/30/2017  ? Malaise and fatigue 09/30/2017  ? Medicare annual wellness visit, subsequent 09/30/2017  ? Nonsustained ventricular tachycardia ([place])   ? Orchitis 11/06/2018  ? PAF (paroxysmal atrial fibrillation) ([place]) 08/23/2016  ? CHADS2 vasc=2, he has elected not to accept anticoagulation  ? Paroxysmal atrial flutter ([place]) 01/25/2020  ? Penile bleeding 10/02/2018  ? Penile discharge 11/06/2018  ? Peripheral  edema 11/06/2018  ? Persistent atrial fibrillation ([place]) 01/10/2020  ? 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 Productive cough 11/06/2018  ? Seasonal allergies   ? Status post ablation of atrial flutter 04/24/2020  ? Trochanteric bursitis of left hip 10/06/2017  ? Unsteady gait 04/03/2018  ? Ventricular tachycardia ([place]) 03/06/2019  ? Vertigo   ? Vitamin D deficiency 09/30/2017  ? Weight loss 06/09/2020  ? ? ?Past Surgical History:  ?Procedure Laterality Date   ? A-FLUTTER ABLATION N/A 04/03/2020  ? Procedure: A-FLUTTER ABLATION;  Surgeon: Constance Haw, MD;  Location: [place] [facility];  Service: Cardiovascular;  Laterality: N/A;  ? BLADDER SURGERY    ? COLONOSCOPY    ? LEFT HEART CATH AND CORONARY ANGIOGRAPHY N/A 06/16/2021  ? Procedure: LEFT HEART CATH AND CORONARY ANGIOGRAPHY;  Surgeon: Burnell Blanks, MD;  Location: [place] [facility];  Service: Cardiovascular;  Laterality: N/A;  ? MOHS SURGERY Right 06/18/2020  ? ear  ? SKIN SURGERY Left 06/25/2020  ? hand  ? Synchronous Cardioversion  01/16/2020  ? TOTAL SHOULDER ARTHROPLASTY Left 06/20/2019  ? Procedure: TOTAL SHOULDER ARTHROPLASTY;  Surgeon: Hiram Gash, MD;  Location: [facility];  Service: Orthopedics;  Laterality: Left;  ? VEIN SURGERY Left   ? ? ?Current Medications: ?Current Meds  ?Medication Sig  ? acetaminophen (TYLENOL) 500 MG tablet Take 500 mg by mouth in the morning and at bedtime.  ? CRANBERRY PO Take 1 tablet by mouth daily. Unknown strength  ? Dextromethorphan-guaiFENesin (MUCINEX DM PO) Take 1 tablet by mouth 2 (two) times daily.  ? ELIQUIS 5 MG TABS tablet TAKE 1 TABLET BY MOUTH 2 TIMES DAILY  ? furosemide (LASIX) 20 MG tablet Take 20 mg by mouth daily as needed for fluid.  ? linaclotide (LINZESS) 72 MCG capsule Take 72 mcg by mouth daily before breakfast.  ? magnesium oxide (MAG-OX) 400 MG tablet Take 400 mg by mouth daily.  ? mirtazapine (REMERON) 15 MG tablet Take 15 mg by mouth at bedtime.  ? Multiple Vitamin (MULTIVITAMIN ADULT PO) Take 1 tablet by mouth in the morning and at bedtime. Unknown strength  ? Multiple Vitamins-Minerals (HAIR SKIN AND NAILS FORMULA) TABS Take 1 tablet by mouth 2 (two) times daily. Unknown strength  ? Multiple Vitamins-Minerals (ICAPS AREDS 2 PO) Take 1 capsule by mouth daily. Unknown strength  ? nitroGLYCERIN (NITROSTAT) 0.4 MG SL tablet Place 1 tablet (0.4 mg total) under the tongue every 5 (five) minutes as needed for chest pain.  ? olopatadine  (PATANOL) 0.1 % ophthalmic solution Place 1 drop into both eyes 2 (two) times daily.  ? Peppermint Oil (IBGARD) 90 MG CPCR Take 1 capsule by mouth in the morning and at bedtime.  ? Probiotic Product (PROBIOTIC DAILY) CAPS Take 1 capsule by mouth daily. Unknown strength  ? traMADol (ULTRAM) 50 MG tablet Take 50 mg by mouth at bedtime as needed for severe pain.  ? traZODone (DESYREL) 150 MG tablet Take 150 mg by mouth at bedtime.   ?  ? ?Allergies:   Terbinafine and related  ? ?Social History  ? ?Socioeconomic History  ? Marital status: Married  ?  Spouse name: Not on file  ? Number of children: Not on file  ? Years of education: Not on file  ? Highest education level: Not on file  ?Occupational History  ? Not on file  ?Tobacco Use  ? Smoking status: Former  ?  Packs/day: 1.00  ?  Years: 70.00  ?  Pack years: 70.00  ?  Types: Cigarettes  ?  Quit date: 04/2018  ?  Years since quitting: 3.2  ? Smokeless tobacco: Never  ?Vaping Use  ? Vaping Use: Never used  ?Substance and Sexual Activity  ? Alcohol use: No  ? Drug use: No  ? Sexual activity: Not on file  ?Other Topics Concern  ? Not on file  ?Social History Narrative  ? Not on file  ? ?Social Determinants of Health  ? ?Financial Resource Strain: Not on file  ?Food Insecurity: Not on file  ?Transportation Needs: Not on file  ?Physical Activity: Not on file  ?Stress: Not on file  ?Social Connections: Not on file  ?  ? ?Family History: ?The patient's family history includes Heart attack in his brother; Heart failure in his father. ?ROS:   ?Please see the history of present illness.    ?All 14 point review of systems negative except as described per history of present illness ? ?EKGs/Labs/Other Studies Reviewed:   ? ? ? ?Recent Labs: ?06/10/2021: BUN 16; Creatinine, Ser 1.00; Hemoglobin 13.2; Platelets 288; Potassium 4.1; Sodium 140  ?Recent Lipid Panel ?No results found for: CHOL, TRIG, HDL, CHOLHDL, VLDL, LDLCALC, LDLDIRECT ? ?Physical Exam:   ? ?VS:  BP (!) 94/58 (BP  Location: Left Arm, Patient Position: Sitting)   Pulse (!) 57   Ht '6\' 1"'$  (1.854 m)   Wt 176 lb (79.8 kg)   SpO2 98%   BMI 23.22 kg/m?    ? ?Wt Readings from Last 3 Encounters:  ?07/06/21 176 lb (79.8 kg)  ?04/2

## 2021-07-06 NOTE — Addendum Note (Signed)
Addended by: Jacobo Forest D on: 07/06/2021 02:40 PM ? ? Modules accepted: Orders ? ?

## 2021-07-06 NOTE — Patient Instructions (Addendum)
Medication Instructions:  ?Your physician has recommended you make the following change in your medication:  ? ?START: Midodrine 2.'5mg'$  1 tablet every 8 hours as needed  ? ? ?Lab Work: ?None Ordered ?If you have labs (blood work) drawn today and your tests are completely normal, you will receive your results only by: ?MyChart Message (if you have MyChart) OR ?A paper copy in the mail ?If you have any lab test that is abnormal or we need to change your treatment, we will call you to review the results. ? ? ?Testing/Procedures: ?Your physician has requested that you have a upper extremity arterial duplex. This test is an ultrasound of the arteries in the arms. It looks at arterial blood flow in the arms. Allow one hour for Upper Arterial scans. There are no restrictions or special instructions  ? ? ?Follow-Up: ?At [facility], you and your health needs are our priority.  As part of our continuing mission to provide you with exceptional heart care, we have created designated Provider Care Teams.  These Care Teams include your primary Cardiologist (physician) and Advanced Practice Providers (APPs -  Physician Assistants and Nurse Practitioners) who all work together to provide you with the care you need, when you need it. ? ?We recommend signing up for the patient portal called "MyChart".  Sign up information is provided on this After Visit Summary.  MyChart is used to connect with patients for Virtual Visits (Telemedicine).  Patients are able to view lab/test results, encounter notes, upcoming appointments, etc.  Non-urgent messages can be sent to your provider as well.   ?To learn more about what you can do with MyChart, go to [email or web address].   ? ?Your next appointment:   ?3 month(s) ? ?The format for your next appointment:   ?In Person ? ?Provider:   ?[name], MD  ? ? ?Other Instructions ?NA  ?

## 2021-07-08 ENCOUNTER — Ambulatory Visit: Payer: Medicare HMO | Admitting: Cardiology

## 2021-07-08 ENCOUNTER — Ambulatory Visit (INDEPENDENT_AMBULATORY_CARE_PROVIDER_SITE_OTHER): Payer: Medicare HMO

## 2021-07-08 DIAGNOSIS — M79601 Pain in right arm: Secondary | ICD-10-CM | POA: Diagnosis not present

## 2021-07-15 ENCOUNTER — Telehealth: Payer: Self-pay

## 2021-07-15 NOTE — Telephone Encounter (Signed)
Spoke with pts wife Jana Half- per DPR- Results reviewed with pt's wife as per Dr. Wendy Poet note. Spouse stated that the pt continues to have arm pain and wanted to know what the next steps are to determine the reason for his pain. Message sent to Dr. Agustin Cree.

## 2021-07-16 ENCOUNTER — Telehealth: Payer: Self-pay

## 2021-07-16 NOTE — Telephone Encounter (Signed)
Per Dr. Wendy Poet note- spoke with pt's spouse martha- per DPR- advised to follow up with PCP for continued arm pain. Spouse was agreeable and had no further questions.

## 2021-07-22 ENCOUNTER — Telehealth: Payer: Self-pay

## 2021-07-22 ENCOUNTER — Encounter: Payer: Self-pay | Admitting: Cardiology

## 2021-07-22 ENCOUNTER — Ambulatory Visit: Payer: Medicare HMO | Admitting: Cardiology

## 2021-07-22 VITALS — BP 90/64 | HR 74 | Ht 73.0 in | Wt 172.0 lb

## 2021-07-22 DIAGNOSIS — I480 Paroxysmal atrial fibrillation: Secondary | ICD-10-CM | POA: Diagnosis not present

## 2021-07-22 DIAGNOSIS — M79641 Pain in right hand: Secondary | ICD-10-CM

## 2021-07-22 DIAGNOSIS — J431 Panlobular emphysema: Secondary | ICD-10-CM

## 2021-07-22 NOTE — Progress Notes (Unsigned)
Cardiology Office Note:    Date:  07/22/2021   ID:  Wayne Mcguire, DOB Jun 14, 1932, MRN [id number]  PCP:  [name], MD  Cardiologist:  [name], MD    Referring MD: [name], MD   Chief Complaint  Patient presents with   Follow-up  I still have a pain in my right hand  History of Present Illness:    Wayne Mcguire is a 88 y.o. male with past medical history significant paroxysmal atrial fibrillation, atrial flutter status post atrial flutter ablation done in February 2022, essential hypertension, nonsustained ventricular tachycardia, quite extensive ablation has been performed including stress test his symptomatology was still somewhat concerning eventually cardiac catheterization was done which showed hemodynamically insignificant lesion that clearly does not explain his symptomatology.  Cardiac catheterization was performed on 06/16/2021.  Since the time of cardiac catheterization he has been complaining of having right hand pain.  He said pain is worse in the morning when he gets up he have to move his arm and fingers then pain will get better.  He did have arterial evaluation with ultrasounds of carotids or ulnar and radial artery and that looks good.  Past Medical History:  Diagnosis Date   Acute non-recurrent pansinusitis 04/18/2019   Adrenal adenoma 09/30/2017   Adult situational stress disorder    Anxiety    Arthritis of left glenohumeral joint 06/20/2019   Bladder cancer ([place])    Bloody stool 11/27/2019   Chest pain in adult 08/23/2016   Stress echo test done 09/05/15 Normal at 7 mets       Chronic anticoagulation 11/15/2016   Chronic anxiety 09/09/2016   Chronic insomnia 09/09/2016   Chronic left shoulder pain 06/09/2020   Chronic pain of both knees 10/06/2017   Chronic pain of both shoulders 09/14/2018   Compression fracture of T12 vertebra ([place]) 02/17/2021   Constipation 10/02/2018   Costochondral chest pain 09/07/2016   Cough with hemoptysis 01/06/2017    Current mild episode of major depressive disorder ([place]) 09/29/2020   Depression    Diarrhea 10/02/2018   Dysuria 01/06/2017   Emphysema lung ([facility])    Mild   Esophagitis    GERD (gastroesophageal reflux disease)    GERD without esophagitis 09/09/2016   H/O total shoulder replacement, 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   Hematuria 03/18/2020   High risk medication use 08/23/2016   [facility] discharge follow[facility]up 12/24/2019   Hypotension 03/10/2018   Left lower quadrant abdominal pain 11/27/2019   Low back pain 11/06/2018   Lumbar herniated disc    Lung nodule < 6cm on CT 09/30/2017   Malaise and fatigue 09/30/2017   Medicare annual wellness visit, subsequent 09/30/2017   Nonsustained ventricular tachycardia ([place])    Orchitis 11/06/2018   PAF (paroxysmal atrial fibrillation) ([place]) 08/23/2016   CHADS2 vasc=2, he has elected not to accept anticoagulation   Paroxysmal atrial flutter ([place]) 01/25/2020   Penile bleeding 10/02/2018   Penile discharge 11/06/2018   Peripheral edema 11/06/2018   Persistent atrial fibrillation ([place]) 01/10/2020   Primary osteoarthritis involving multiple joints 03/29/2019   Productive cough 11/06/2018   Seasonal allergies    Status post ablation of atrial flutter 04/24/2020   Trochanteric bursitis of left hip 10/06/2017   Unsteady gait 04/03/2018   Ventricular tachycardia ([place]) 03/06/2019   Vertigo    Vitamin D deficiency 09/30/2017   Weight loss 06/09/2020    Past Surgical History:  Procedure Laterality Date   A-FLUTTER ABLATION N/A 04/03/2020   Procedure:  A-FLUTTER ABLATION;  Surgeon: Constance Haw, MD;  Location: [place] [facility];  Service: Cardiovascular;  Laterality: N/A;   BLADDER SURGERY     COLONOSCOPY     LEFT HEART CATH AND CORONARY ANGIOGRAPHY N/A 06/16/2021   Procedure: LEFT HEART CATH AND CORONARY ANGIOGRAPHY;  Surgeon: Burnell Blanks, MD;  Location: [place] [facility];  Service: Cardiovascular;  Laterality: N/A;   MOHS SURGERY Right 06/18/2020   ear   SKIN  SURGERY Left 06/25/2020   hand   Synchronous Cardioversion  01/16/2020   TOTAL SHOULDER ARTHROPLASTY Left 06/20/2019   Procedure: TOTAL SHOULDER ARTHROPLASTY;  Surgeon: Hiram Gash, MD;  Location: [facility];  Service: Orthopedics;  Laterality: Left;   VEIN SURGERY Left     Current Medications: Current Meds  Medication Sig   acetaminophen (TYLENOL) 500 MG tablet Take 500 mg by mouth in the morning and at bedtime.   CRANBERRY PO Take 1 tablet by mouth daily. Unknown strength   Dextromethorphan-guaiFENesin (MUCINEX DM PO) Take 1 tablet by mouth 2 (two) times daily.   ELIQUIS 5 MG TABS tablet TAKE 1 TABLET BY MOUTH 2 TIMES DAILY   furosemide (LASIX) 20 MG tablet Take 20 mg by mouth daily as needed for fluid.   linaclotide (LINZESS) 72 MCG capsule Take 72 mcg by mouth daily before breakfast.   magnesium oxide (MAG-OX) 400 MG tablet Take 400 mg by mouth daily.   midodrine (PROAMATINE) 2.5 MG tablet Take 1 tablet every 8 hours as needed   mirtazapine (REMERON) 15 MG tablet Take 15 mg by mouth at bedtime.   Multiple Vitamin (MULTIVITAMIN ADULT PO) Take 1 tablet by mouth in the morning and at bedtime. Unknown strength   Multiple Vitamins-Minerals (HAIR SKIN AND NAILS FORMULA) TABS Take 1 tablet by mouth 2 (two) times daily. Unknown strength   Multiple Vitamins-Minerals (ICAPS AREDS 2 PO) Take 1 capsule by mouth daily. Unknown strength   nitroGLYCERIN (NITROSTAT) 0.4 MG SL tablet Place 1 tablet (0.4 mg total) under the tongue every 5 (five) minutes as needed for chest pain.   olopatadine (PATANOL) 0.1 % ophthalmic solution Place 1 drop into both eyes 2 (two) times daily.   Peppermint Oil (IBGARD) 90 MG CPCR Take 1 capsule by mouth in the morning and at bedtime.   Probiotic Product (PROBIOTIC DAILY) CAPS Take 1 capsule by mouth daily. Unknown strength   traMADol (ULTRAM) 50 MG tablet Take 50 mg by mouth at bedtime as needed for severe pain.   traZODone (DESYREL) 150 MG tablet Take 150 mg by mouth at  bedtime.      Allergies:   Terbinafine and related   Social History   Socioeconomic History   Marital status: Married    Spouse name: Not on file   Number of children: Not on file   Years of education: Not on file   Highest education level: Not on file  Occupational History   Not on file  Tobacco Use   Smoking status: Every Day    Packs/day: 1.00    Years: 70.00    Pack years: 70.00    Types: Cigarettes   Smokeless tobacco: Never  Vaping Use   Vaping Use: Never used  Substance and Sexual Activity   Alcohol use: No   Drug use: No   Sexual activity: Not on file  Other Topics Concern   Not on file  Social History Narrative   Not on file   Social Determinants of Health   Financial Resource  Strain: Not on file  Food Insecurity: Not on file  Transportation Needs: Not on file  Physical Activity: Not on file  Stress: Not on file  Social Connections: Not on file     Family History: The patient's family history includes Heart attack in his brother; Heart failure in his father. ROS:   Please see the history of present illness.    All 14 point review of systems negative except as described per history of present illness  EKGs/Labs/Other Studies Reviewed:      Recent Labs: 06/10/2021: BUN 16; Creatinine, Ser 1.00; Hemoglobin 13.2; Platelets 288; Potassium 4.1; Sodium 140  Recent Lipid Panel No results found for: CHOL, TRIG, HDL, CHOLHDL, VLDL, LDLCALC, LDLDIRECT  Physical Exam:    VS:  BP 90/64 (BP Location: Left Arm, Patient Position: Sitting, Cuff Size: Normal)   Pulse 74   Ht '6\' 1"'$  (1.854 m)   Wt 172 lb (78 kg)   SpO2 96%   BMI 22.69 kg/m     Wt Readings from Last 3 Encounters:  07/22/21 172 lb (78 kg)  07/06/21 176 lb (79.8 kg)  06/16/21 169 lb (76.7 kg)     GEN:  Well nourished, well developed in no acute distress HEENT: Normal NECK: No JVD; No carotid bruits LYMPHATICS: No lymphadenopathy CARDIAC: RRR, no murmurs, no rubs, no gallops RESPIRATORY:   Clear to auscultation without rales, wheezing or rhonchi  ABDOMEN: Soft, non-tender, non-distended MUSCULOSKELETAL:  No edema; No deformity  SKIN: Warm and dry LOWER EXTREMITIES: no swelling NEUROLOGIC:  Alert and oriented x 3 PSYCHIATRIC:  Normal affect   ASSESSMENT:    1. Pain of right hand   2. PAF (paroxysmal atrial fibrillation) ([facility])   3. Panlobular emphysema ([facility]CC)    PLAN:    In order of problems listed above:  Pain in the right hand.  Capillary refill is excellent.  I can palpate radial pulse distally to puncture of cardiac catheterization wound almost healed completely he does have sensation intact but he still complain of having some tingling in his fingers.  I do not think it is a vascular problem with dealing with.  Of course interesting why it was not triggered by cardiac catheterization.  He may have some component of carpal tunnel syndrome.  He is scheduled to see his primary care physician first and then if it is unrevealing he may need to see orthopedic doctor trying to establish with the pain comes from. Paroxysmal atrial fibrillation.  Stable Emphysema: Still continues to smoke   Medication Adjustments/Labs and Tests Ordered: Current medicines are reviewed at length with the patient today.  Concerns regarding medicines are outlined above.  No orders of the defined types were placed in this encounter.  Medication changes: No orders of the defined types were placed in this encounter.   Signed, Park Liter, MD, [facility] 07/22/2021 12:26 PM    [place]

## 2021-07-22 NOTE — Telephone Encounter (Signed)
Called to check on pts arm pain. Spouse stated that the pt is having increased arm pain. Appt made to come in today 07/22/21 per Dr. Agustin Cree.

## 2021-07-23 DIAGNOSIS — M25562 Pain in left knee: Secondary | ICD-10-CM | POA: Diagnosis not present

## 2021-07-23 DIAGNOSIS — G8929 Other chronic pain: Secondary | ICD-10-CM | POA: Diagnosis not present

## 2021-07-23 DIAGNOSIS — M25511 Pain in right shoulder: Secondary | ICD-10-CM | POA: Diagnosis not present

## 2021-07-31 DIAGNOSIS — H5712 Ocular pain, left eye: Secondary | ICD-10-CM | POA: Diagnosis not present

## 2021-08-12 DIAGNOSIS — Z936 Other artificial openings of urinary tract status: Secondary | ICD-10-CM | POA: Diagnosis not present

## 2021-08-20 DIAGNOSIS — R5383 Other fatigue: Secondary | ICD-10-CM | POA: Diagnosis not present

## 2021-08-20 DIAGNOSIS — S22080A Wedge compression fracture of T11-T12 vertebra, initial encounter for closed fracture: Secondary | ICD-10-CM | POA: Diagnosis not present

## 2021-08-20 DIAGNOSIS — F320 Major depressive disorder, single episode, mild: Secondary | ICD-10-CM | POA: Diagnosis not present

## 2021-08-20 DIAGNOSIS — D3500 Benign neoplasm of unspecified adrenal gland: Secondary | ICD-10-CM | POA: Diagnosis not present

## 2021-08-20 DIAGNOSIS — R42 Dizziness and giddiness: Secondary | ICD-10-CM | POA: Diagnosis not present

## 2021-08-20 DIAGNOSIS — R5381 Other malaise: Secondary | ICD-10-CM | POA: Diagnosis not present

## 2021-08-20 DIAGNOSIS — R634 Abnormal weight loss: Secondary | ICD-10-CM | POA: Diagnosis not present

## 2021-09-11 DIAGNOSIS — F3341 Major depressive disorder, recurrent, in partial remission: Secondary | ICD-10-CM | POA: Diagnosis not present

## 2021-09-11 DIAGNOSIS — F0390 Unspecified dementia without behavioral disturbance: Secondary | ICD-10-CM | POA: Insufficient documentation

## 2021-09-11 DIAGNOSIS — R031 Nonspecific low blood-pressure reading: Secondary | ICD-10-CM | POA: Diagnosis not present

## 2021-09-29 ENCOUNTER — Other Ambulatory Visit: Payer: Self-pay | Admitting: Orthopaedic Surgery

## 2021-09-29 DIAGNOSIS — M19011 Primary osteoarthritis, right shoulder: Secondary | ICD-10-CM | POA: Diagnosis not present

## 2021-09-29 DIAGNOSIS — Z01818 Encounter for other preprocedural examination: Secondary | ICD-10-CM

## 2021-10-06 ENCOUNTER — Telehealth: Payer: Self-pay

## 2021-10-06 NOTE — Telephone Encounter (Signed)
   [facility] Pre-operative Risk Assessment    Request for surgical clearance:  What type of surgery is being performed? Right Reverse total shoulder replacement    When is this surgery scheduled? TBD   What type of clearance is required (medical clearance vs. Pharmacy clearance to hold med vs. Both)? Both  Are there any medications that need to be held prior to surgery and how long? Not specifies    Practice name and name of physician performing surgery? [facility]  Dr. Ophelia Charter   What is your office phone number: [phone] ext [phone]    7.   What is your office fax number: [phone] and [phone]  8.   Anesthesia type (None, local, MAC, general) ? Choice   Wayne Mcguire 10/06/2021, 6:13 PM  _________________________________________________________________   (provider comments below)

## 2021-10-07 NOTE — Telephone Encounter (Signed)
   Name: Wayne Mcguire  DOB: [birth year]  MRN: [id number]  Primary Cardiologist: Shirlee More, MD   Preoperative team, please contact this patient and set up a phone call appointment for further preoperative risk assessment. Please obtain consent and complete medication review. Thank you for your help.  I confirm that guidance regarding antiplatelet and oral anticoagulation therapy has been completed and, if necessary, noted below.  Patient with diagnosis of afib on Eliquis for anticoagulation.     Procedure: Right Reverse total shoulder replacement   Date of procedure: TBD   CHA2DS2-VASc Score = 3  This indicates a 3.2% annual risk of stroke. The patient's score is based upon: CHF History: 0 HTN History: 0 Diabetes History: 0 Stroke History: 0 Vascular Disease History: 1 Age Score: 2 Gender Score: 0   CrCl 39m/min Platelet count 189K   Per office protocol, patient can hold Eliquis for 3 days prior to procedure.      ELenna Sciara NP 10/07/2021, 2:35 PM [place]

## 2021-10-07 NOTE — Telephone Encounter (Signed)
Patient with diagnosis of afib on Eliquis for anticoagulation.    Procedure: Right Reverse total shoulder replacement   Date of procedure: TBD  CHA2DS2-VASc Score = 3  This indicates a 3.2% annual risk of stroke. The patient's score is based upon: CHF History: 0 HTN History: 0 Diabetes History: 0 Stroke History: 0 Vascular Disease History: 1 Age Score: 2 Gender Score: 0   CrCl 49m/min Platelet count 189K  Per office protocol, patient can hold Eliquis for 3 days prior to procedure.    **This guidance is not considered finalized until pre-operative APP has relayed final recommendations.**

## 2021-10-08 ENCOUNTER — Telehealth: Payer: Self-pay | Admitting: *Deleted

## 2021-10-08 NOTE — Telephone Encounter (Signed)
Pt has been scheduled for a tele visit, 10/13/21 2:20.  They did want to keep the in-person appointment 10/21/21 with Dr. Agustin Cree, but wanted to go ahead and do the tele visit to get the clearance underway so they could get a surgery date.

## 2021-10-08 NOTE — Telephone Encounter (Signed)
Pt has been scheduled for a tele visit, 10/13/21 2:20.  Consent on file / medications reconciled.    Patient Consent for Virtual Visit   3      Wayne Mcguire has provided verbal consent on 10/08/2021 for a virtual visit (video or telephone).   CONSENT FOR VIRTUAL VISIT FOR:  Wayne Mcguire  By participating in this virtual visit I agree to the following:  I hereby voluntarily request, consent and authorize [facility] and its employed or contracted physicians, physician assistants, nurse practitioners or other licensed health care professionals (the Practitioner), to provide me with telemedicine health care services (the "Services") as deemed necessary by the treating Practitioner. I acknowledge and consent to receive the Services by the Practitioner via telemedicine. I understand that the telemedicine visit will involve communicating with the Practitioner through live audiovisual communication technology and the disclosure of certain medical information by electronic transmission. I acknowledge that I have been given the opportunity to request an in-person assessment or other available alternative prior to the telemedicine visit and am voluntarily participating in the telemedicine visit.  I understand that I have the right to withhold or withdraw my consent to the use of telemedicine in the course of my care at any time, without affecting my right to future care or treatment, and that the Practitioner or I may terminate the telemedicine visit at any time. I understand that I have the right to inspect all information obtained and/or recorded in the course of the telemedicine visit and may receive copies of available information for a reasonable fee.  I understand that some of the potential risks of receiving the Services via telemedicine include:  Delay or interruption in medical evaluation due to technological equipment failure or disruption; Information transmitted may not be sufficient (e.g.  poor resolution of images) to allow for appropriate medical decision making by the Practitioner; and/or  In rare instances, security protocols could fail, causing a breach of personal health information.  Furthermore, I acknowledge that it is my responsibility to provide information about my medical history, conditions and care that is complete and accurate to the best of my ability. I acknowledge that Practitioner's advice, recommendations, and/or decision may be based on factors not within their control, such as incomplete or inaccurate data provided by me or distortions of diagnostic images or specimens that may result from electronic transmissions. I understand that the practice of medicine is not an exact science and that Practitioner makes no warranties or guarantees regarding treatment outcomes. I acknowledge that a copy of this consent can be made available to me via my patient portal ([facility]), or I can request a printed copy by calling the office of [facility].    I understand that my insurance will be billed for this visit.   I have read or had this consent read to me. I understand the contents of this consent, which adequately explains the benefits and risks of the Services being provided via telemedicine.  I have been provided ample opportunity to ask questions regarding this consent and the Services and have had my questions answered to my satisfaction. I give my informed consent for the services to be provided through the use of telemedicine in my medical care

## 2021-10-09 DIAGNOSIS — M25511 Pain in right shoulder: Secondary | ICD-10-CM | POA: Diagnosis not present

## 2021-10-09 DIAGNOSIS — M25611 Stiffness of right shoulder, not elsewhere classified: Secondary | ICD-10-CM | POA: Diagnosis not present

## 2021-10-09 DIAGNOSIS — M19011 Primary osteoarthritis, right shoulder: Secondary | ICD-10-CM | POA: Diagnosis not present

## 2021-10-09 DIAGNOSIS — Z01818 Encounter for other preprocedural examination: Secondary | ICD-10-CM | POA: Diagnosis not present

## 2021-10-09 DIAGNOSIS — I700 Atherosclerosis of aorta: Secondary | ICD-10-CM | POA: Diagnosis not present

## 2021-10-13 ENCOUNTER — Telehealth: Payer: Medicare HMO

## 2021-10-13 ENCOUNTER — Other Ambulatory Visit: Payer: Medicare HMO

## 2021-10-14 ENCOUNTER — Ambulatory Visit (INDEPENDENT_AMBULATORY_CARE_PROVIDER_SITE_OTHER): Payer: Medicare HMO | Admitting: General Practice

## 2021-10-14 DIAGNOSIS — Z01810 Encounter for preprocedural cardiovascular examination: Secondary | ICD-10-CM | POA: Diagnosis not present

## 2021-10-14 NOTE — Progress Notes (Signed)
Virtual Visit via Telephone Note   Because of Wayne Mcguire's co-morbid illnesses, he is at least at moderate risk for complications without adequate follow up.  This format is felt to be most appropriate for this patient at this time.  The patient did not have access to video technology/had technical difficulties with video requiring transitioning to audio format only (telephone).  All issues noted in this document were discussed and addressed.  No physical exam could be performed with this format.  Please refer to the patient's chart for his consent to telehealth for [facility].  Evaluation Performed:  Preoperative cardiovascular risk assessment _____________   Date:  10/14/2021   Patient ID:  Wayne Mcguire, DOB 08-Oct-1932, MRN [id number] Patient Location:  Home Provider location:   Office  Primary Care Provider:  [name], MD Primary Cardiologist:  Wayne More, MD  Chief Complaint / Patient Profile   88 y.o. y/o male with a h/o anxiety, GERD, paroxysmal atrial fibrillation who is pending right reverse total shoulder replacement and presents today for telephonic preoperative cardiovascular risk assessment.  Past Medical History    Past Medical History:  Diagnosis Date   Acute non-recurrent pansinusitis 04/18/2019   Adrenal adenoma 09/30/2017   Adult situational stress disorder    Anxiety    Arthritis of left glenohumeral joint 06/20/2019   Bladder cancer ([place])    Bloody stool 11/27/2019   Chest pain in adult 08/23/2016   Stress echo test done 09/05/15 Normal at 7 mets       Chronic anticoagulation 11/15/2016   Chronic anxiety 09/09/2016   Chronic insomnia 09/09/2016   Chronic left shoulder pain 06/09/2020   Chronic pain of both knees 10/06/2017   Chronic pain of both shoulders 09/14/2018   Compression fracture of T12 vertebra ([place]) 02/17/2021   Constipation 10/02/2018   Costochondral chest pain 09/07/2016   Cough with hemoptysis 01/06/2017   Current mild episode of  major depressive disorder ([place]) 09/29/2020   Depression    Diarrhea 10/02/2018   Dysuria 01/06/2017   Emphysema lung (HCC)    Mild   Esophagitis    GERD (gastroesophageal reflux disease)    GERD without esophagitis 09/09/2016   H/O total shoulder replacement, 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   Hematuria 03/18/2020   High risk medication use 08/23/2016   [facility] discharge follow-up 12/24/2019   Hypotension 03/10/2018   Left lower quadrant abdominal pain 11/27/2019   Low back pain 11/06/2018   Lumbar herniated disc    Lung nodule < 6cm on CT 09/30/2017   Malaise and fatigue 09/30/2017   Medicare annual wellness visit, subsequent 09/30/2017   Nonsustained ventricular tachycardia ([place])    Orchitis 11/06/2018   PAF (paroxysmal atrial fibrillation) ([place]) 08/23/2016   CHADS2 vasc=2, he has elected not to accept anticoagulation   Paroxysmal atrial flutter ([place]) 01/25/2020   Penile bleeding 10/02/2018   Penile discharge 11/06/2018   Peripheral edema 11/06/2018   Persistent atrial fibrillation ([place]) 01/10/2020   Primary osteoarthritis involving multiple joints 03/29/2019   Productive cough 11/06/2018   Seasonal allergies    Status post ablation of atrial flutter 04/24/2020   Trochanteric bursitis of left hip 10/06/2017   Unsteady gait 04/03/2018   Ventricular tachycardia ([place]) 03/06/2019   Vertigo    Vitamin D deficiency 09/30/2017   Weight loss 06/09/2020   Past Surgical History:  Procedure Laterality Date   A-FLUTTER ABLATION N/A 04/03/2020   Procedure: A-FLUTTER ABLATION;  Surgeon: Constance Haw, MD;  Location: [facility]  INVASIVE CV LAB;  Service: Cardiovascular;  Laterality: N/A;   BLADDER SURGERY     COLONOSCOPY     LEFT HEART CATH AND CORONARY ANGIOGRAPHY N/A 06/16/2021   Procedure: LEFT HEART CATH AND CORONARY ANGIOGRAPHY;  Surgeon: Burnell Blanks, MD;  Location: [facility] CV LAB;  Service: Cardiovascular;  Laterality: N/A;   MOHS SURGERY Right 06/18/2020   ear   SKIN SURGERY Left 06/25/2020   hand    Synchronous Cardioversion  01/16/2020   TOTAL SHOULDER ARTHROPLASTY Left 06/20/2019   Procedure: TOTAL SHOULDER ARTHROPLASTY;  Surgeon: Hiram Gash, MD;  Location: [facility] ORS;  Service: Orthopedics;  Laterality: Left;   VEIN SURGERY Left     Allergies  Allergies  Allergen Reactions   Terbinafine And Related Other (See Comments)    Dizziness and blurry vision, may cause irregular heart beat.    History of Present Illness    Wayne Mcguire is a 88 y.o. male who presents via audio/video conferencing for a telehealth visit today.  Pt was last seen in cardiology clinic on 07/22/2021 by Dr. Agustin Cree.  At that time Wayne Mcguire was doing well .  The patient is now pending procedure as outlined above. Since his last visit, he remains stable from a cardiac standpoint.  Today he denies chest pain, shortness of breath, lower extremity edema, fatigue, palpitations, melena, hematuria, hemoptysis, diaphoresis, weakness, presyncope, syncope, orthopnea, and PND.    Home Medications    Prior to Admission medications   Medication Sig Start Date End Date Taking? Authorizing Provider  acetaminophen (TYLENOL) 500 MG tablet Take 500 mg by mouth in the morning and at bedtime.    [provider]  cetirizine (ZYRTEC) 10 MG tablet Take 10 mg by mouth daily.    [provider]  CRANBERRY PO Take 1 tablet by mouth daily. Unknown strength    [provider]  Dextromethorphan-guaiFENesin (MUCINEX DM PO) Take 1 tablet by mouth 2 (two) times daily.    [provider]  ELIQUIS 5 MG TABS tablet TAKE 1 TABLET BY MOUTH 2 TIMES DAILY 04/08/21   Camnitz, Ocie Doyne, MD  furosemide (LASIX) 20 MG tablet Take 20 mg by mouth daily as needed for fluid. 07/27/19   [provider]  linaclotide (LINZESS) 72 MCG capsule Take 72 mcg by mouth daily before breakfast.    [provider]  magnesium oxide (MAG-OX) 400 MG tablet Take 400 mg by mouth daily.    [provider]   Melatonin 10 MG TABS Take 1 tablet by mouth at bedtime.    [provider]  midodrine (PROAMATINE) 2.5 MG tablet Take 1 tablet every 8 hours as needed 07/06/21   Park Liter, MD  mirtazapine (REMERON) 15 MG tablet Take 15 mg by mouth at bedtime. 09/29/20   [provider]  Multiple Vitamin (MULTIVITAMIN ADULT PO) Take 1 tablet by mouth in the morning and at bedtime. Unknown strength    [provider]  Multiple Vitamins-Minerals (HAIR SKIN AND NAILS FORMULA) TABS Take 1 tablet by mouth 2 (two) times daily. Unknown strength    [provider]  Multiple Vitamins-Minerals (ICAPS AREDS 2 PO) Take 1 capsule by mouth daily. Unknown strength    [provider]  nitroGLYCERIN (NITROSTAT) 0.4 MG SL tablet Place 1 tablet (0.4 mg total) under the tongue every 5 (five) minutes as needed for chest pain. 10/29/20   Park Liter, MD  Peppermint Oil (IBGARD) 90 MG CPCR Take 1 capsule by  mouth in the morning and at bedtime.    [provider]  Probiotic Product (PROBIOTIC DAILY) CAPS Take 1 capsule by mouth daily. Unknown strength    [provider]  traMADol (ULTRAM) 50 MG tablet Take 50 mg by mouth at bedtime as needed for severe pain.    [provider]  traZODone (DESYREL) 150 MG tablet Take 150 mg by mouth at bedtime.  03/08/16   [provider]    Physical Exam    Vital Signs:  Wayne Mcguire does not have vital signs available for review today.  Given telephonic nature of communication, physical exam is limited. AAOx3. NAD. Normal affect.  Speech and respirations are unlabored.  Accessory Clinical Findings    None  Assessment & Plan    1.  Preoperative Cardiovascular Risk Assessment: Right reverse total shoulder replacement; Dr. Ophelia Charter, [facility]     Primary Cardiologist: Wayne More, MD  Chart reviewed as part of pre-operative protocol coverage. Given past medical history and time  since last visit, based on ACC/AHA guidelines, Wayne Mcguire would be at acceptable risk for the planned procedure without further cardiovascular testing.   Patient was advised that if he develops new symptoms prior to surgery to contact our office to arrange a follow-up appointment.  He verbalized understanding.   Patient with diagnosis of afib on Eliquis for anticoagulation.     Procedure: Right Reverse total shoulder replacement   Date of procedure: TBD   CHA2DS2-VASc Score = 3  This indicates a 3.2% annual risk of stroke. The patient's score is based upon: CHF History: 0 HTN History: 0 Diabetes History: 0 Stroke History: 0 Vascular Disease History: 1 Age Score: 2 Gender Score: 0   CrCl 76m/min Platelet count 189K   Per office protocol, patient can hold Eliquis for 3 days prior to procedure.     His RCRI is a class 1 risk, 0.4% risk of major cardiac event.  He is able to complete greater than 4 METS of physical activity.  A copy of this note will be routed to requesting surgeon.  Time:   Today, I have spent 6 minutes with the patient with telehealth technology discussing medical history, symptoms, and management plan.     JDeberah Pelton NP  10/14/2021, 11:06 AM

## 2021-10-19 DIAGNOSIS — H47393 Other disorders of optic disc, bilateral: Secondary | ICD-10-CM | POA: Diagnosis not present

## 2021-10-19 DIAGNOSIS — H40003 Preglaucoma, unspecified, bilateral: Secondary | ICD-10-CM | POA: Diagnosis not present

## 2021-10-19 DIAGNOSIS — H35372 Puckering of macula, left eye: Secondary | ICD-10-CM | POA: Diagnosis not present

## 2021-10-21 ENCOUNTER — Ambulatory Visit: Payer: Medicare HMO | Admitting: Cardiology

## 2021-10-21 ENCOUNTER — Ambulatory Visit: Payer: Medicare HMO | Attending: Cardiology | Admitting: Cardiology

## 2021-10-21 ENCOUNTER — Encounter: Payer: Self-pay | Admitting: Cardiology

## 2021-10-21 VITALS — BP 108/60 | HR 58 | Ht 73.0 in | Wt 170.8 lb

## 2021-10-21 DIAGNOSIS — I472 Ventricular tachycardia, unspecified: Secondary | ICD-10-CM | POA: Diagnosis not present

## 2021-10-21 DIAGNOSIS — R5383 Other fatigue: Secondary | ICD-10-CM

## 2021-10-21 DIAGNOSIS — Z9889 Other specified postprocedural states: Secondary | ICD-10-CM | POA: Diagnosis not present

## 2021-10-21 DIAGNOSIS — I480 Paroxysmal atrial fibrillation: Secondary | ICD-10-CM | POA: Diagnosis not present

## 2021-10-21 DIAGNOSIS — R5381 Other malaise: Secondary | ICD-10-CM

## 2021-10-21 DIAGNOSIS — Z8679 Personal history of other diseases of the circulatory system: Secondary | ICD-10-CM | POA: Diagnosis not present

## 2021-10-21 NOTE — Patient Instructions (Signed)

## 2021-10-21 NOTE — Progress Notes (Signed)
Cardiology Office Note:    Date:  10/21/2021   ID:  Wayne Mcguire, DOB Aug 13, 1932, MRN [id number]  PCP:  [name], MD  Cardiologist:  [name], MD    Referring MD: [name], MD   Chief Complaint  Patient presents with   Follow-up    History of Present Illness:    Wayne Mcguire is a 88 y.o. male  with past medical history significant paroxysmal atrial fibrillation, atrial flutter status post atrial flutter ablation done in February 2022, essential hypertension, nonsustained ventricular tachycardia, quite extensive ablation has been performed including stress test his symptomatology was still somewhat concerning eventually cardiac catheterization was done which showed hemodynamically insignificant lesion that clearly does not explain his symptomatology.  Cardiac catheterization was performed on 06/16/2021.  Since the time of cardiac catheterization he has been complaining of having right hand pain. He comes to my office for follow-up.  Overall he seems to be doing well when asking about how he is feeling he said no trouble but his wife tell me he still complain of having a lot of pain in the wrist and he does have a right shoulder problem and he is getting ready to have shoulder replacement surgery.  He denies have any cardiac complaints.  There is no shortness of breath chest pain tightness squeezing pressure burning chest  Past Medical History:  Diagnosis Date   Acute non-recurrent pansinusitis 04/18/2019   Adrenal adenoma 09/30/2017   Adult situational stress disorder    Anxiety    Arthritis of left glenohumeral joint 06/20/2019   Bladder cancer ([place])    Bloody stool 11/27/2019   Chest pain in adult 08/23/2016   Stress echo test done 09/05/15 Normal at 7 mets       Chronic anticoagulation 11/15/2016   Chronic anxiety 09/09/2016   Chronic insomnia 09/09/2016   Chronic left shoulder pain 06/09/2020   Chronic pain of both knees 10/06/2017   Chronic pain of both  shoulders 09/14/2018   Compression fracture of T12 vertebra ([place]) 02/17/2021   Constipation 10/02/2018   Costochondral chest pain 09/07/2016   Cough with hemoptysis 01/06/2017   Current mild episode of major depressive disorder ([place]) 09/29/2020   Depression    Diarrhea 10/02/2018   Dysuria 01/06/2017   Emphysema lung (HCC)    Mild   Esophagitis    GERD (gastroesophageal reflux disease)    GERD without esophagitis 09/09/2016   H/O total shoulder replacement, 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   Hematuria 03/18/2020   High risk medication use 08/23/2016   [facility] discharge follow-up 12/24/2019   Hypotension 03/10/2018   Left lower quadrant abdominal pain 11/27/2019   Low back pain 11/06/2018   Lumbar herniated disc    Lung nodule < 6cm on CT 09/30/2017   Malaise and fatigue 09/30/2017   Medicare annual wellness visit, subsequent 09/30/2017   Nonsustained ventricular tachycardia ([place])    Orchitis 11/06/2018   PAF (paroxysmal atrial fibrillation) ([place]) 08/23/2016   CHADS2 vasc=2, he has elected not to accept anticoagulation   Paroxysmal atrial flutter ([place]) 01/25/2020   Penile bleeding 10/02/2018   Penile discharge 11/06/2018   Peripheral edema 11/06/2018   Persistent atrial fibrillation ([place]) 01/10/2020   Primary osteoarthritis involving multiple joints 03/29/2019   Productive cough 11/06/2018   Seasonal allergies    Status post ablation of atrial flutter 04/24/2020   Trochanteric bursitis of left hip 10/06/2017   Unsteady gait 04/03/2018   Ventricular tachycardia ([place]) 03/06/2019   Vertigo  Vitamin D deficiency 09/30/2017   Weight loss 06/09/2020    Past Surgical History:  Procedure Laterality Date   A-FLUTTER ABLATION N/A 04/03/2020   Procedure: A-FLUTTER ABLATION;  Surgeon: Constance Haw, MD;  Location: [facility] CV LAB;  Service: Cardiovascular;  Laterality: N/A;   BLADDER SURGERY     COLONOSCOPY     LEFT HEART CATH AND CORONARY ANGIOGRAPHY N/A 06/16/2021   Procedure: LEFT HEART CATH AND CORONARY  ANGIOGRAPHY;  Surgeon: Burnell Blanks, MD;  Location: [facility] CV LAB;  Service: Cardiovascular;  Laterality: N/A;   MOHS SURGERY Right 06/18/2020   ear   SKIN SURGERY Left 06/25/2020   hand   Synchronous Cardioversion  01/16/2020   TOTAL SHOULDER ARTHROPLASTY Left 06/20/2019   Procedure: TOTAL SHOULDER ARTHROPLASTY;  Surgeon: Hiram Gash, MD;  Location: [facility];  Service: Orthopedics;  Laterality: Left;   VEIN SURGERY Left     Current Medications: Current Meds  Medication Sig   acetaminophen (TYLENOL) 500 MG tablet Take 500 mg by mouth in the morning and at bedtime.   cetirizine (ZYRTEC) 10 MG tablet Take 10 mg by mouth daily.   CRANBERRY PO Take 1 tablet by mouth daily. Unknown strength   Dextromethorphan-guaiFENesin (MUCINEX DM PO) Take 1 tablet by mouth 2 (two) times daily.   ELIQUIS 5 MG TABS tablet TAKE 1 TABLET BY MOUTH 2 TIMES DAILY (Patient taking differently: Take 5 mg by mouth 2 (two) times daily.)   furosemide (LASIX) 20 MG tablet Take 20 mg by mouth daily as needed for fluid.   linaclotide (LINZESS) 72 MCG capsule Take 72 mcg by mouth daily before breakfast.   magnesium oxide (MAG-OX) 400 MG tablet Take 400 mg by mouth daily.   Melatonin 10 MG TABS Take 1 tablet by mouth at bedtime.   midodrine (PROAMATINE) 2.5 MG tablet Take 1 tablet every 8 hours as needed (Patient taking differently: Take 2.5 mg by mouth every 8 (eight) hours as needed (hypertension). Take 1 tablet every 8 hours as needed)   mirtazapine (REMERON) 15 MG tablet Take 15 mg by mouth at bedtime.   Multiple Vitamin (MULTIVITAMIN ADULT PO) Take 1 tablet by mouth in the morning and at bedtime. Unknown strength   Multiple Vitamins-Minerals (HAIR SKIN AND NAILS FORMULA) TABS Take 1 tablet by mouth 2 (two) times daily. Unknown strength   Multiple Vitamins-Minerals (ICAPS AREDS 2 PO) Take 1 capsule by mouth daily. Unknown strength   nitroGLYCERIN (NITROSTAT) 0.4 MG SL tablet Place 1 tablet (0.4 mg total)  under the tongue every 5 (five) minutes as needed for chest pain.   Peppermint Oil (IBGARD) 90 MG CPCR Take 1 capsule by mouth in the morning and at bedtime.   Probiotic Product (PROBIOTIC DAILY) CAPS Take 1 capsule by mouth daily. Unknown strength   traMADol (ULTRAM) 50 MG tablet Take 50 mg by mouth at bedtime as needed for severe pain.   traZODone (DESYREL) 150 MG tablet Take 150 mg by mouth at bedtime.      Allergies:   Terbinafine and related   Social History   Socioeconomic History   Marital status: Married    Spouse name: Not on file   Number of children: Not on file   Years of education: Not on file   Highest education level: Not on file  Occupational History   Not on file  Tobacco Use   Smoking status: Every Day    Packs/day: 1.00    Years: 70.00    Total  pack years: 70.00    Types: Cigarettes   Smokeless tobacco: Never  Vaping Use   Vaping Use: Never used  Substance and Sexual Activity   Alcohol use: No   Drug use: No   Sexual activity: Not on file  Other Topics Concern   Not on file  Social History Narrative   Not on file   Social Determinants of Health   Financial Resource Strain: Not on file  Food Insecurity: Not on file  Transportation Needs: Not on file  Physical Activity: Not on file  Stress: Not on file  Social Connections: Not on file     Family History: The patient's family history includes Heart attack in his brother; Heart failure in his father. ROS:   Please see the history of present illness.    All 14 point review of systems negative except as described per history of present illness  EKGs/Labs/Other Studies Reviewed:      Recent Labs: 06/10/2021: BUN 16; Creatinine, Ser 1.00; Hemoglobin 13.2; Platelets 288; Potassium 4.1; Sodium 140  Recent Lipid Panel No results found for: "CHOL", "TRIG", "HDL", "CHOLHDL", "VLDL", "LDLCALC", "LDLDIRECT"  Physical Exam:    VS:  BP 108/60 (BP Location: Left Arm, Patient Position: Sitting)   Pulse  (!) 58   Ht '6\' 1"'$  (1.854 m)   Wt 170 lb 12.8 oz (77.5 kg)   SpO2 94%   BMI 22.53 kg/m     Wt Readings from Last 3 Encounters:  10/21/21 170 lb 12.8 oz (77.5 kg)  07/22/21 172 lb (78 kg)  07/06/21 176 lb (79.8 kg)     GEN:  Well nourished, well developed in no acute distress HEENT: Normal NECK: No JVD; No carotid bruits LYMPHATICS: No lymphadenopathy CARDIAC: RRR, no murmurs, no rubs, no gallops RESPIRATORY:  Clear to auscultation without rales, wheezing or rhonchi  ABDOMEN: Soft, non-tender, non-distended MUSCULOSKELETAL:  No edema; No deformity  SKIN: Warm and dry LOWER EXTREMITIES: no swelling NEUROLOGIC:  Alert and oriented x 3 PSYCHIATRIC:  Normal affect   ASSESSMENT:    1. PAF (paroxysmal atrial fibrillation) ([place])   2. Ventricular tachycardia (HCC)   3. Status post ablation of atrial flutter   4. Malaise and fatigue    PLAN:    In order of problems listed above:  Paroxysmal atrial fibrillation he is anticoagulated denies having any symptoms no palpitations continue present management Ventricle tachycardia denies have any dizziness, overall low risk scenario with a recent cardiac catheterization showing hemodynamically insignificant lesions History of atrial flutter ablation doing well from that point review Preop evaluation for shoulder surgery should be no problem from cardiac standpoint reviewed to do that.  Will be fine to withhold anticoagulation for 2 to 3 days.   Medication Adjustments/Labs and Tests Ordered: Current medicines are reviewed at length with the patient today.  Concerns regarding medicines are outlined above.  No orders of the defined types were placed in this encounter.  Medication changes: No orders of the defined types were placed in this encounter.   Signed, Park Liter, MD, [facility] 10/21/2021 2:05 PM    [place]

## 2021-10-22 ENCOUNTER — Telehealth: Payer: Self-pay | Admitting: Cardiology

## 2021-10-22 NOTE — Telephone Encounter (Signed)
Returned call and left message not sure about CT. Looks like ov note yesterday pt was cleared.

## 2021-10-22 NOTE — Telephone Encounter (Signed)
Pt returning nurse's call. Please advise

## 2021-10-22 NOTE — Telephone Encounter (Signed)
I s/w the pt's wife and she tells me that the pt had a CT done and they were told by Dr. Rich Fuchs office that he has to have a repeat CT. 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 I asked her if Dr. Griffin Basil is requesting a repeat CT and are asking for a cardiac CT they will need to send over an order. This will to be approved by the insurance and the CT scheduler will call them once approved by insurance. I assured the pt's wife that I will send this message to our pre op provider as FYI as well as to Dr. Agustin Cree.   I will send this note to Dr. Griffin Basil surgery scheduler as well. I feel this does not need to come back to pre op at this time. Further orders at this time should be followed by Dr. Agustin Cree and his nurse/CMA. If there is to be a repeat CT this will then be followed by MD for results and pre op clearance from MD after reviewing the results.

## 2021-10-22 NOTE — Telephone Encounter (Signed)
Pt spouse calling back stating that [facility]  Dr. Ophelia Charter, faxed over a request for a CT before the pt's upcoming surgery.

## 2021-10-27 ENCOUNTER — Telehealth: Payer: Self-pay | Admitting: Cardiology

## 2021-10-27 NOTE — Telephone Encounter (Signed)
Attempted to call patient. No-one answered the phone and there was a busy signal so unable to leave a message.

## 2021-10-27 NOTE — Telephone Encounter (Signed)
Called patient's wife and she was having trouble scheduling her husbands shoulder surgery. She would like to know if a chest CT would be necessary prior to giving clearance for the patient's surgery.

## 2021-10-27 NOTE — Telephone Encounter (Signed)
Caller would like to know if Dr. Agustin Cree feels a chest CT would be necessary prior to giving clearance for patient's surgery.

## 2021-10-28 NOTE — Telephone Encounter (Signed)
Caller stated that in preparation for this surgery their shoulder CT found an ascending thoracic aortic aneurysm.  Caller would like to know if cardiologist would want a CT of the chest and would like a call back on how to proceed.  Caller stated she will fax over their report.

## 2021-10-30 NOTE — Telephone Encounter (Signed)
Left message for the patient to call back.

## 2021-10-30 NOTE — Telephone Encounter (Signed)
Received a call from [place] at [facility] and she is faxing over the results of the patient's shoulder CT that showed an aortic anuerysm.

## 2021-10-30 NOTE — Telephone Encounter (Signed)
Once CT has been read by Dr. Agustin Cree and pt has been cleared by MD; nurse/cma for Dr. Agustin Cree can fax over the clearance notes to surgeon office. See clearance request in the chart for information where and to who to send clearance too.

## 2021-10-30 NOTE — Telephone Encounter (Signed)
Lmtcb 9/8

## 2021-11-02 ENCOUNTER — Encounter: Payer: Self-pay | Admitting: Cardiology

## 2021-11-02 NOTE — Telephone Encounter (Signed)
I will forward to pre op provider to see notes from Dr. Griffin Basil

## 2021-11-02 NOTE — Telephone Encounter (Signed)
Spoke with pt.'s spouse to let her know that Dr. Agustin Cree had looked at [facility] CT scan and he stated that it was ok for him to go ahead with his surgery. Also spoke with [place] Mc[place]. with Dr. Baron Hamper office with the same information.

## 2021-11-02 NOTE — Telephone Encounter (Signed)
Error

## 2021-11-02 NOTE — Telephone Encounter (Signed)
Patient's wife called wanting if know if Dr. Agustin Cree has reviewed the CT results and let the surgery center know it's okay for him to have surgery.  If someone can let her know when this happens.

## 2021-11-03 NOTE — Telephone Encounter (Signed)
See telephone encounter dated 10/27/21. CT has been reviewed by Dr. Agustin Cree who noted patient may continue with shoulder surgery as scheduled. Documentation additionally made on clearance request dated 10/06/21.

## 2021-11-05 DIAGNOSIS — L814 Other melanin hyperpigmentation: Secondary | ICD-10-CM | POA: Diagnosis not present

## 2021-11-05 DIAGNOSIS — D225 Melanocytic nevi of trunk: Secondary | ICD-10-CM | POA: Diagnosis not present

## 2021-11-05 DIAGNOSIS — L820 Inflamed seborrheic keratosis: Secondary | ICD-10-CM | POA: Diagnosis not present

## 2021-11-05 DIAGNOSIS — L570 Actinic keratosis: Secondary | ICD-10-CM | POA: Diagnosis not present

## 2021-11-11 ENCOUNTER — Encounter (HOSPITAL_BASED_OUTPATIENT_CLINIC_OR_DEPARTMENT_OTHER): Payer: Self-pay | Admitting: Anesthesiology

## 2021-11-11 ENCOUNTER — Other Ambulatory Visit: Payer: Self-pay

## 2021-11-11 ENCOUNTER — Encounter (HOSPITAL_BASED_OUTPATIENT_CLINIC_OR_DEPARTMENT_OTHER): Payer: Self-pay | Admitting: Orthopaedic Surgery

## 2021-11-11 NOTE — Progress Notes (Signed)
Patient's chart and recent cardiac clearance done by Dr Agustin Cree and copy of MRI of shoulder done 10-08-21 showing possible ascending thoracic aortic aneurysm all reviewed with Dr Royce Macadamia. OK for [facility] and Dr Royce Macadamia wants patient's LD of Eliquis to be Sunday 10-15-21. I will relay this to patient on PAT call.

## 2021-11-12 DIAGNOSIS — H35373 Puckering of macula, bilateral: Secondary | ICD-10-CM | POA: Diagnosis not present

## 2021-11-12 DIAGNOSIS — H43813 Vitreous degeneration, bilateral: Secondary | ICD-10-CM | POA: Diagnosis not present

## 2021-11-12 DIAGNOSIS — H3581 Retinal edema: Secondary | ICD-10-CM | POA: Diagnosis not present

## 2021-11-13 ENCOUNTER — Encounter (HOSPITAL_BASED_OUTPATIENT_CLINIC_OR_DEPARTMENT_OTHER)
Admission: RE | Admit: 2021-11-13 | Discharge: 2021-11-13 | Disposition: A | Discharge status: Home / Self Care | Payer: Medicare HMO | Source: Ambulatory Visit | Attending: Orthopaedic Surgery | Admitting: Orthopaedic Surgery

## 2021-11-13 DIAGNOSIS — Z01812 Encounter for preprocedural laboratory examination: Secondary | ICD-10-CM | POA: Insufficient documentation

## 2021-11-13 LAB — SURGICAL PCR SCREEN
MRSA, PCR: NEGATIVE
Staphylococcus aureus: NEGATIVE

## 2021-11-13 NOTE — Progress Notes (Signed)
Surgical soap given with instructions, pt verbalized understanding.  Benzoyl peroxide gel given with written instructions, pt verbalized understanding.  

## 2021-11-13 NOTE — Progress Notes (Signed)
   11/11/21 1447  PAT Phone Screen  Do You Have Diabetes? No  Do You Have Hypertension? No  Have You Ever Been to the ER for Asthma? No  Have You Taken Oral Steroids in the Past 3 Months? No  Do you Take Phenteramine or any Other Diet Drugs? No  Recent  Lab Work, EKG, CXR? Yes  Where was this test performed? 06-10-21 EKG-[facility]  Do you have a history of heart problems? (S)  Yes;Cardiologist (Dr Agustin Cree for PAF)  Have You Ever Had Tests on Your Heart? Yes  Where? 06-16-21 heart cath EF 55-65%, mild CAD  Any Recent Hospitalizations? No  Height '6\' 1"'$  (1.854 m)  Weight 79.4 kg  [employer] (S)  Yes (coming in for PAT)

## 2021-11-19 ENCOUNTER — Ambulatory Visit (HOSPITAL_BASED_OUTPATIENT_CLINIC_OR_DEPARTMENT_OTHER): Admission: RE | Admit: 2021-11-19 | Payer: Medicare HMO | Source: Home / Self Care | Admitting: Orthopaedic Surgery

## 2021-11-19 DIAGNOSIS — Z01818 Encounter for other preprocedural examination: Secondary | ICD-10-CM

## 2021-11-19 SURGERY — ARTHROPLASTY, SHOULDER, TOTAL, REVERSE
Anesthesia: General | Site: Shoulder | Laterality: Right

## 2021-11-25 DIAGNOSIS — M25511 Pain in right shoulder: Secondary | ICD-10-CM | POA: Diagnosis not present

## 2021-11-25 DIAGNOSIS — M19011 Primary osteoarthritis, right shoulder: Secondary | ICD-10-CM | POA: Diagnosis not present

## 2021-11-26 DIAGNOSIS — M19011 Primary osteoarthritis, right shoulder: Secondary | ICD-10-CM | POA: Diagnosis not present

## 2021-11-26 DIAGNOSIS — M19012 Primary osteoarthritis, left shoulder: Secondary | ICD-10-CM | POA: Diagnosis not present

## 2021-12-10 DIAGNOSIS — Z936 Other artificial openings of urinary tract status: Secondary | ICD-10-CM | POA: Diagnosis not present

## 2021-12-15 DIAGNOSIS — K219 Gastro-esophageal reflux disease without esophagitis: Secondary | ICD-10-CM | POA: Diagnosis not present

## 2021-12-15 DIAGNOSIS — Z79899 Other long term (current) drug therapy: Secondary | ICD-10-CM | POA: Diagnosis not present

## 2021-12-15 DIAGNOSIS — H35372 Puckering of macula, left eye: Secondary | ICD-10-CM | POA: Diagnosis not present

## 2021-12-15 DIAGNOSIS — F17210 Nicotine dependence, cigarettes, uncomplicated: Secondary | ICD-10-CM | POA: Diagnosis not present

## 2021-12-15 DIAGNOSIS — Z7901 Long term (current) use of anticoagulants: Secondary | ICD-10-CM | POA: Diagnosis not present

## 2021-12-21 DIAGNOSIS — M79605 Pain in left leg: Secondary | ICD-10-CM | POA: Diagnosis not present

## 2021-12-21 DIAGNOSIS — M79662 Pain in left lower leg: Secondary | ICD-10-CM | POA: Diagnosis not present

## 2021-12-23 ENCOUNTER — Encounter (HOSPITAL_BASED_OUTPATIENT_CLINIC_OR_DEPARTMENT_OTHER): Payer: Self-pay | Admitting: Orthopaedic Surgery

## 2021-12-28 ENCOUNTER — Encounter (HOSPITAL_BASED_OUTPATIENT_CLINIC_OR_DEPARTMENT_OTHER)
Admission: RE | Admit: 2021-12-28 | Discharge: 2021-12-28 | Disposition: A | Discharge status: Home / Self Care | Payer: Medicare HMO | Source: Ambulatory Visit | Attending: Orthopaedic Surgery | Admitting: Orthopaedic Surgery

## 2021-12-28 DIAGNOSIS — Z01812 Encounter for preprocedural laboratory examination: Secondary | ICD-10-CM | POA: Diagnosis not present

## 2021-12-28 LAB — SURGICAL PCR SCREEN
MRSA, PCR: NEGATIVE
Staphylococcus aureus: NEGATIVE

## 2021-12-30 NOTE — Discharge Instructions (Signed)
Ophelia Charter MD, MPH Noemi Chapel, PA-C [place] [address] [phone] (tel)   [phone] (fax)   [place] may leave the operative dressing in place until your follow-up appointment. KEEP THE INCISIONS CLEAN AND DRY. There may be a small amount of fluid/bleeding leaking at the surgical site. This is normal after surgery.  If it fills with liquid or blood please call us immediately to change it for you. Use the provided ice machine or Ice packs as often as possible for the first 3-4 days, then as needed for pain relief.   Keep a layer of cloth or a shirt between your skin and the cooling unit to prevent frost bite as it can get very cold.  SHOWERING: - You may shower on Post-Op Day #2.  - The dressing is water resistant but do not scrub it as it may start to peel up.   - You may remove the sling for showering - Gently pat the area dry.  - Do not soak the shoulder in water.  - Do not go swimming in the pool or ocean until your incision has completely healed (about 4-6 weeks after surgery) - KEEP THE INCISIONS CLEAN AND DRY.  EXERCISES Wear the sling at all times  You may remove the sling for showering, but keep the arm across the chest or in a secondary sling.    Accidental/Purposeful External Rotation and shoulder flexion (reaching behind you) is to be avoided at all costs for the first month. It is ok to come out of your sling if your are sitting and have assistance for eating.   Do not lift anything heavier than 1 pound until we discuss it further in clinic.  It is normal for your fingers/hand to become more swollen after surgery and discolored from bruising.   This will resolve over the first few weeks usually after surgery. Please continue to ambulate and do not stay sitting or lying for too long.  Perform foot and wrist pumps to assist in circulation.  PHYSICAL  THERAPY - You will begin physical therapy soon after surgery (unless otherwise specified) - Please call to set up an appointment, if you do not already have one   - A PT referral was sent to [facility] in [place] - No therapy for 4 weeks after surgery  REGIONAL ANESTHESIA ([place]) The anesthesia team may have performed a nerve block for you this is a great tool used to minimize pain.   The block may start wearing off overnight (between 8-24 hours postop) When the block wears off, your pain may go from nearly zero to the pain you would have had postop without the block. This is an abrupt transition but nothing dangerous is happening.   This can be a challenging period but utilize your as needed pain medications to try and manage this period. We suggest you use the pain medication the first night prior to going to bed, to ease this transition.  You may take an extra dose of narcotic when this happens if needed   POST-OP MEDICATIONS- Multimodal approach to pain control In general your pain will be controlled with a combination of substances.  Prescriptions unless otherwise discussed are electronically sent to your pharmacy.  This is a carefully made plan we use to minimize narcotic use.     Acetaminophen - Non-narcotic pain medicine taken on a scheduled basis  Oxycodone -  This is a strong narcotic, to be used only on an "as needed" basis for SEVERE pain. Zofran -  take as needed for nausea  - Keflex - this is an antibiotic for your finger injury  - You may resume Eliquis 24 hours after surgery   FOLLOW-UP If you develop a Fever (>101.5), Redness or Drainage from the surgical incision site, please call our office to arrange for an evaluation. Please call the office to schedule a follow-up appointment for a wound check, 7-10 days post-operatively.  IF YOU HAVE ANY QUESTIONS, PLEASE FEEL FREE TO CALL OUR OFFICE.  HELPFUL INFORMATION  Your arm will be in a sling following  surgery. You will be in this sling for the next 4 weeks.   You may be more comfortable sleeping in a semi-seated position the first few nights following surgery.  Keep a pillow propped under the elbow and forearm for comfort.  If you have a recliner type of chair it might be beneficial.  If not that is fine too, but it would be helpful to sleep propped up with pillows behind your operated shoulder as well under your elbow and forearm.  This will reduce pulling on the suture lines.  When dressing, put your operative arm in the sleeve first.  When getting undressed, take your operative arm out last.  Loose fitting, button-down shirts are recommended.  In most states it is against the law to drive while your arm is in a sling. And certainly against the law to drive while taking narcotics.  You may return to work/school in the next couple of days when you feel up to it. Desk work and typing in the sling is fine.  We suggest you use the pain medication the first night prior to going to bed, in order to ease any pain when the anesthesia wears off. You should avoid taking pain medications on an empty stomach as it will make you nauseous.  You should wean off your narcotic medicines as soon as you are able.     Most patients will be off or using minimal narcotics before their first postop appointment.   Do not drink alcoholic beverages or take illicit drugs when taking pain medications.  Pain medication may make you constipated.  Below are a few solutions to try in this order: Decrease the amount of pain medication if you aren't having pain. Drink lots of decaffeinated fluids. Drink prune juice and/or each dried prunes  If the first 3 don't work start with additional solutions Take Colace - an over-the-counter stool softener Take Senokot - an over-the-counter laxative Take Miralax - a stronger over-the-counter laxative   Dental Antibiotics:  In most cases prophylactic antibiotics for Dental  procdeures after total joint surgery are not necessary.  Exceptions are as follows:  1. History of prior total joint infection  2. Severely immunocompromised (Organ Transplant, cancer chemotherapy, Rheumatoid biologic meds such as [place])  3. Poorly controlled diabetes (A1C &gt; 8.0, blood glucose over 200)  If you have one of these conditions, contact your surgeon for an antibiotic prescription, prior to your dental procedure.   For more information including helpful videos and documents visit our website:   [email or web address]   May take Tylenol after 12:50 pm, if needed.   Post Anesthesia Home Care Instructions  Activity: Get plenty of rest for the remainder of the day. A responsible individual must stay with you for 24 hours following the procedure.  For the next 24 hours, DO NOT: -Drive  a car -Paediatric nurse -Drink alcoholic beverages -Take any medication unless instructed by your physician -Make any legal decisions or sign important papers.  Meals: Start with liquid foods such as gelatin or soup. Progress to regular foods as tolerated. Avoid greasy, spicy, heavy foods. If nausea and/or vomiting occur, drink only clear liquids until the nausea and/or vomiting subsides. Call your physician if vomiting continues.  Special Instructions/Symptoms: Your throat may feel dry or sore from the anesthesia or the breathing tube placed in your throat during surgery. If this causes discomfort, gargle with warm salt water. The discomfort should disappear within 24 hours.  If you had a scopolamine patch placed behind your ear for the management of post- operative nausea and/or vomiting:  1. The medication in the patch is effective for 72 hours, after which it should be removed.  Wrap patch in a tissue and discard in the trash. Wash hands thoroughly with soap and water. 2. You may remove the patch earlier than 72 hours if you experience unpleasant  side effects which may include dry mouth, dizziness or visual disturbances. 3. Avoid touching the patch. Wash your hands with soap and water after contact with the patch.    Regional Anesthesia Blocks  1. Numbness or the inability to move the "blocked" extremity may last from 3-48 hours after placement. The length of time depends on the medication injected and your individual response to the medication. If the numbness is not going away after 48 hours, call your surgeon.  2. The extremity that is blocked will need to be protected until the numbness is gone and the  Strength has returned. Because you cannot feel it, you will need to take extra care to avoid injury. Because it may be weak, you may have difficulty moving it or using it. You may not know what position it is in without looking at it while the block is in effect.  3. For blocks in the legs and feet, returning to weight bearing and walking needs to be done carefully. You will need to wait until the numbness is entirely gone and the strength has returned. You should be able to move your leg and foot normally before you try and bear weight or walk. You will need someone to be with you when you first try to ensure you do not fall and possibly risk injury.  4. Bruising and tenderness at the needle site are common side effects and will resolve in a few days.  5. Persistent numbness or new problems with movement should be communicated to the surgeon or the [place] [phone] [place] [phone]).

## 2021-12-30 NOTE — Anesthesia Preprocedure Evaluation (Signed)
Anesthesia Evaluation  Patient identified by MRN, date of birth, ID band Patient awake    Reviewed: Allergy & Precautions, NPO status , Patient's Chart, lab work & pertinent test results  History of Anesthesia Complications Negative for: history of anesthetic complications  Airway Mallampati: II  TM Distance: >3 FB Neck ROM: Full    Dental no notable dental hx.    Pulmonary COPD, Current Smoker and Patient abstained from smoking.   Pulmonary exam normal        Cardiovascular Normal cardiovascular exam+ dysrhythmias (on Eliquis) Atrial Fibrillation and Ventricular Tachycardia   LHC 05/2021:    Mid LAD lesion is 40% stenosed.   The left ventricular systolic function is normal.   LV end diastolic pressure is normal.   The left ventricular ejection fraction is 55-65% by visual estimate.   Mild non-obstructive CAD 40% calcified mid LAD stenosis No angiographic evidence of disease in the large, dominant Circumflex or small non-dominant RCA Normal LV systolic function Normal LVEDP    Neuro/Psych   Anxiety Depression   Dementia    GI/Hepatic Neg liver ROS,GERD  ,,  Endo/Other  negative endocrine ROS    Renal/GU negative Renal ROS     Musculoskeletal  (+) Arthritis ,    Abdominal   Peds  Hematology negative hematology ROS (+)   Anesthesia Other Findings Day of surgery medications reviewed with patient.  Reproductive/Obstetrics                              Anesthesia Physical Anesthesia Plan  ASA: 3  Anesthesia Plan: General   Post-op Pain Management: Regional block* and Tylenol PO (pre-op)*   Induction: Intravenous  PONV Risk Score and Plan: 1 and Treatment may vary due to age or medical condition, Dexamethasone and Ondansetron  Airway Management Planned: Oral ETT  Additional Equipment: None  Intra-op Plan:   Post-operative Plan: Extubation in OR  Informed Consent: I have  reviewed the patients History and Physical, chart, labs and discussed the procedure including the risks, benefits and alternatives for the proposed anesthesia with the patient or authorized representative who has indicated his/her understanding and acceptance.     Dental advisory given  Plan Discussed with: CRNA  Anesthesia Plan Comments:          Anesthesia Quick Evaluation

## 2021-12-30 NOTE — H&P (Signed)
PREOPERATIVE H&P  Chief Complaint: djd right shoulder  HPI: Wayne Mcguire is a 88 y.o. male who is scheduled for Procedure(s): REVERSE SHOULDER ARTHROPLASTY.   Patient has a past medical history significant for paroxysmal atrial fibrillation, GERD, and mild aortic valve stenosis .   Patient has had right shoulder pain for many years. He has tried injections, medications and nonoperative measures and has not made much progress with this.  He is having significant limitations and is unhappy with his function.  His motion has been limited.   He had a left reverse total shoulder arthroplasty on  06/20/2019 with Dr. Griffin Basil and did very well with this.   Symptoms are rated as moderate to severe, and have been worsening.  This is significantly impairing activities of daily living.    Please see clinic note for further details on this patient's care.    He has elected for surgical management.   Past Medical History:  Diagnosis Date   Acute non-recurrent pansinusitis 04/18/2019   Adrenal adenoma 09/30/2017   Adult situational stress disorder    Anxiety    Arthritis of left glenohumeral joint 06/20/2019   Bladder cancer (HCC)    Bloody stool 11/27/2019   Chest pain in adult 08/23/2016   Stress echo test done 09/05/15 Normal at 7 mets       Chronic anticoagulation 11/15/2016   Chronic anxiety 09/09/2016   Chronic insomnia 09/09/2016   Chronic left shoulder pain 06/09/2020   Chronic pain of both knees 10/06/2017   Chronic pain of both shoulders 09/14/2018   Compression fracture of T12 vertebra ([place]) 02/17/2021   Constipation 10/02/2018   Costochondral chest pain 09/07/2016   Cough with hemoptysis 01/06/2017   Current mild episode of major depressive disorder ([place]) 09/29/2020   Depression    Diarrhea 10/02/2018   Dysuria 01/06/2017   Emphysema lung ([place])    current smoker   Esophagitis    GERD (gastroesophageal reflux disease)    GERD without esophagitis 09/09/2016   H/O  total shoulder replacement, 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   Hematuria 03/18/2020   High risk medication use 08/23/2016   [facility] discharge follow-up 12/24/2019   Hypotension 03/10/2018   Left lower quadrant abdominal pain 11/27/2019   Low back pain 11/06/2018   Lumbar herniated disc    Lung nodule < 6cm on CT 09/30/2017   Malaise and fatigue 09/30/2017   Medicare annual wellness visit, subsequent 09/30/2017   Nonsustained ventricular tachycardia ([place])    Orchitis 11/06/2018   PAF (paroxysmal atrial fibrillation) ([place]) 08/23/2016   CHADS2 vasc=2, he has elected not to accept anticoagulation   Paroxysmal atrial flutter ([place]) 01/25/2020   Penile bleeding 10/02/2018   Penile discharge 11/06/2018   Peripheral edema 11/06/2018   Persistent atrial fibrillation ([place]) 01/10/2020   Primary osteoarthritis involving multiple joints 03/29/2019   Productive cough 11/06/2018   Seasonal allergies    Status post ablation of atrial flutter 04/24/2020   Trochanteric bursitis of left hip 10/06/2017   Unsteady gait 04/03/2018   Ventricular tachycardia ([place]) 03/06/2019   Vertigo    Vitamin D deficiency 09/30/2017   Weight loss 06/09/2020   Past Surgical History:  Procedure Laterality Date   A-FLUTTER ABLATION N/A 04/03/2020   Procedure: A-FLUTTER ABLATION;  Surgeon: Constance Haw, MD;  Location: [facility];  Service: Cardiovascular;  Laterality: N/A;   BLADDER SURGERY     COLONOSCOPY     LEFT HEART CATH AND CORONARY ANGIOGRAPHY N/A 06/16/2021  Procedure: LEFT HEART CATH AND CORONARY ANGIOGRAPHY;  Surgeon: Burnell Blanks, MD;  Location: [facility];  Service: Cardiovascular;  Laterality: N/A;   MOHS SURGERY Right 06/18/2020   ear   SKIN SURGERY Left 06/25/2020   hand   Synchronous Cardioversion  01/16/2020   TOTAL SHOULDER ARTHROPLASTY Left 06/20/2019   Procedure: TOTAL SHOULDER ARTHROPLASTY;  Surgeon: Hiram Gash, MD;  Location: [facility];  Service: Orthopedics;   Laterality: Left;   VEIN SURGERY Left    Social History   Socioeconomic History   Marital status: Married    Spouse name: Not on file   Number of children: Not on file   Years of education: Not on file   Highest education level: Not on file  Occupational History   Not on file  Tobacco Use   Smoking status: Every Day    Packs/day: 1.00    Years: 70.00    Total pack years: 70.00    Types: Cigarettes   Smokeless tobacco: Never  Vaping Use   Vaping Use: Never used  Substance and Sexual Activity   Alcohol use: No   Drug use: No   Sexual activity: Not Currently  Other Topics Concern   Not on file  Social History Narrative   Not on file   Social Determinants of Health   Financial Resource Strain: Not on file  Food Insecurity: Not on file  Transportation Needs: Not on file  Physical Activity: Not on file  Stress: Not on file  Social Connections: Not on file   Family History  Problem Relation Age of Onset   Heart attack Brother    Heart failure Father    Allergies  Allergen Reactions   Terbinafine And Related Other (See Comments)    Dizziness and blurry vision, may cause irregular heart beat.   Prior to Admission medications   Medication Sig Start Date End Date Taking? Authorizing Provider  acetaminophen (TYLENOL) 500 MG tablet Take 500 mg by mouth in the morning and at bedtime.   Yes [provider]  CRANBERRY PO Take 1 tablet by mouth daily. Unknown strength   Yes [provider]  Dextromethorphan-guaiFENesin (MUCINEX DM PO) Take 1 tablet by mouth 2 (two) times daily.   Yes [provider]  ELIQUIS 5 MG TABS tablet TAKE 1 TABLET BY MOUTH 2 TIMES DAILY Patient taking differently: Take 5 mg by mouth 2 (two) times daily. 04/08/21  Yes Camnitz, Will Hassell Done, MD  furosemide (LASIX) 20 MG tablet Take 20 mg by mouth daily as needed for fluid. 07/27/19  Yes [provider]  linaclotide (LINZESS) 72 MCG capsule Take 72 mcg by mouth daily  before breakfast.   Yes [provider]  magnesium oxide (MAG-OX) 400 MG tablet Take 400 mg by mouth daily.   Yes [provider]  Melatonin 10 MG TABS Take 1 tablet by mouth at bedtime.   Yes [provider]  mirtazapine (REMERON) 15 MG tablet Take 15 mg by mouth at bedtime. 09/29/20  Yes [provider]  Multiple Vitamin (MULTIVITAMIN ADULT PO) Take 1 tablet by mouth in the morning and at bedtime. Unknown strength   Yes [provider]  Multiple Vitamins-Minerals (HAIR SKIN AND NAILS FORMULA) TABS Take 1 tablet by mouth 2 (two) times daily. Unknown strength   Yes [provider]  Multiple Vitamins-Minerals (ICAPS AREDS 2 PO) Take 1 capsule by mouth daily. Unknown strength   Yes [provider]  Peppermint Oil (IBGARD) 90 MG  CPCR Take 1 capsule by mouth in the morning and at bedtime.   Yes [provider]  Probiotic Product (PROBIOTIC DAILY) CAPS Take 1 capsule by mouth daily. Unknown strength   Yes [provider]  traMADol (ULTRAM) 50 MG tablet Take 50 mg by mouth at bedtime as needed for severe pain.   Yes [provider]  traZODone (DESYREL) 150 MG tablet Take 150 mg by mouth at bedtime.  03/08/16  Yes [provider]  nitroGLYCERIN (NITROSTAT) 0.4 MG SL tablet Place 1 tablet (0.4 mg total) under the tongue every 5 (five) minutes as needed for chest pain. 10/29/20   Park Liter, MD    ROS: All other systems have been reviewed and were otherwise negative with the exception of those mentioned in the HPI and as above.  Physical Exam: General: Alert, no acute distress Cardiovascular: No pedal edema Respiratory: No cyanosis, no use of accessory musculature GI: No organomegaly, abdomen is soft and non-tender Skin: No lesions in the area of chief complaint Neurologic: Sensation intact distally Psychiatric: Patient is competent for consent with normal mood and affect Lymphatic: No axillary or  cervical lymphadenopathy  MUSCULOSKELETAL:  His examination is essentially unchanged with about 60 degrees of forward flexion. Negative 20 in external rotation. Internal rotation to the front pocket.   Imaging: Three views of the right shoulder were obtained and show bone on bone osteoarthritis of the right glenohumeral joint. There is a large inferior osteophyte on the humeral head.   BMI: Body mass index is 21.77 kg/m.  Lab Results  Component Value Date   ALBUMIN 4.1 05/12/2020   Diabetes: Patient does not have a diagnosis of diabetes.     Smoking Status: Social History   Tobacco Use  Smoking Status Every Day   Packs/day: 1.00   Years: 70.00   Total pack years: 70.00   Types: Cigarettes  Smokeless Tobacco Never   Ready to quit: Not Answered Counseling given: Not Answered  The patient has participated in a 4-week cessation program.           Assessment: djd right shoulder  Plan: Plan for Procedure(s): REVERSE SHOULDER ARTHROPLASTY  The risks benefits and alternatives were discussed with the patient including but not limited to the risks of nonoperative treatment, versus surgical intervention including infection, bleeding, nerve injury,  blood clots, cardiopulmonary complications, morbidity, mortality, among others, and they were willing to proceed.   We additionally specifically discussed risks of axillary nerve injury, infection, periprosthetic fracture, continued pain and longevity of implants prior to beginning procedure.    Patient will be closely monitored in PACU for medical stabilization and pain control. If found stable in PACU, patient may be discharged home with outpatient follow-up. If any concerns regarding patient's stabilization patient will be admitted for observation after surgery. The patient is planning to be discharged home with outpatient PT.   The patient acknowledged the explanation, agreed to proceed with the plan and consent was signed.    He received operative clearance from his PCP, Dr. Unk Lightning, and his cardiologist, Dr. Agustin Cree  Operative Plan: Right reverse total shoulder arthroplasty Discharge Medications: Standard DVT Prophylaxis: Resume eliquis Physical Therapy: [facility] in [place] Discharge needs: [place]. [place], PA-C  12/30/2021 7:37 AM

## 2021-12-31 ENCOUNTER — Ambulatory Visit (HOSPITAL_BASED_OUTPATIENT_CLINIC_OR_DEPARTMENT_OTHER): Payer: Medicare HMO | Admitting: Anesthesiology

## 2021-12-31 ENCOUNTER — Other Ambulatory Visit: Payer: Self-pay

## 2021-12-31 ENCOUNTER — Encounter (HOSPITAL_BASED_OUTPATIENT_CLINIC_OR_DEPARTMENT_OTHER)
Admission: RE | Disposition: A | Discharge status: Home / Self Care | Payer: Self-pay | Source: Home / Self Care | Attending: Orthopaedic Surgery

## 2021-12-31 ENCOUNTER — Ambulatory Visit (HOSPITAL_COMMUNITY): Payer: Medicare HMO

## 2021-12-31 ENCOUNTER — Encounter (HOSPITAL_BASED_OUTPATIENT_CLINIC_OR_DEPARTMENT_OTHER): Payer: Self-pay | Admitting: Orthopaedic Surgery

## 2021-12-31 ENCOUNTER — Ambulatory Visit (HOSPITAL_BASED_OUTPATIENT_CLINIC_OR_DEPARTMENT_OTHER)
Admission: RE | Admit: 2021-12-31 | Discharge: 2021-12-31 | Disposition: A | Discharge status: Home / Self Care | Payer: Medicare HMO | Attending: Orthopaedic Surgery | Admitting: Orthopaedic Surgery

## 2021-12-31 DIAGNOSIS — G8918 Other acute postprocedural pain: Secondary | ICD-10-CM | POA: Diagnosis not present

## 2021-12-31 DIAGNOSIS — M19011 Primary osteoarthritis, right shoulder: Secondary | ICD-10-CM

## 2021-12-31 DIAGNOSIS — F0390 Unspecified dementia without behavioral disturbance: Secondary | ICD-10-CM | POA: Insufficient documentation

## 2021-12-31 DIAGNOSIS — Z96611 Presence of right artificial shoulder joint: Secondary | ICD-10-CM | POA: Diagnosis not present

## 2021-12-31 DIAGNOSIS — Z96612 Presence of left artificial shoulder joint: Secondary | ICD-10-CM | POA: Insufficient documentation

## 2021-12-31 DIAGNOSIS — J449 Chronic obstructive pulmonary disease, unspecified: Secondary | ICD-10-CM | POA: Diagnosis not present

## 2021-12-31 DIAGNOSIS — I350 Nonrheumatic aortic (valve) stenosis: Secondary | ICD-10-CM | POA: Insufficient documentation

## 2021-12-31 DIAGNOSIS — F17210 Nicotine dependence, cigarettes, uncomplicated: Secondary | ICD-10-CM | POA: Insufficient documentation

## 2021-12-31 DIAGNOSIS — Z7901 Long term (current) use of anticoagulants: Secondary | ICD-10-CM | POA: Diagnosis not present

## 2021-12-31 DIAGNOSIS — I4819 Other persistent atrial fibrillation: Secondary | ICD-10-CM | POA: Insufficient documentation

## 2021-12-31 DIAGNOSIS — F419 Anxiety disorder, unspecified: Secondary | ICD-10-CM | POA: Diagnosis not present

## 2021-12-31 DIAGNOSIS — F32A Depression, unspecified: Secondary | ICD-10-CM | POA: Diagnosis not present

## 2021-12-31 DIAGNOSIS — Z01818 Encounter for other preprocedural examination: Secondary | ICD-10-CM

## 2021-12-31 DIAGNOSIS — I4891 Unspecified atrial fibrillation: Secondary | ICD-10-CM | POA: Diagnosis not present

## 2021-12-31 DIAGNOSIS — Z471 Aftercare following joint replacement surgery: Secondary | ICD-10-CM | POA: Diagnosis not present

## 2021-12-31 DIAGNOSIS — F17200 Nicotine dependence, unspecified, uncomplicated: Secondary | ICD-10-CM | POA: Diagnosis not present

## 2021-12-31 HISTORY — DX: Cardiac arrhythmia, unspecified: I49.9

## 2021-12-31 HISTORY — PX: REVERSE SHOULDER ARTHROPLASTY: SHX5054

## 2021-12-31 SURGERY — ARTHROPLASTY, SHOULDER, TOTAL, REVERSE
Anesthesia: General | Site: Shoulder | Laterality: Right

## 2021-12-31 MED ORDER — TRANEXAMIC ACID-NACL 1000-0.7 MG/100ML-% IV SOLN
1000.0000 mg | INTRAVENOUS | Status: AC
  Administered 2021-12-31: 1000 mg via INTRAVENOUS

## 2021-12-31 MED ORDER — OXYCODONE HCL 5 MG PO TABS: ORAL_TABLET | ORAL | 0 refills | Status: AC

## 2021-12-31 MED ORDER — BUPIVACAINE LIPOSOME 1.3 % IJ SUSP
INTRAMUSCULAR | Status: DC | PRN
  Administered 2021-12-31: 10 mL via PERINEURAL

## 2021-12-31 MED ORDER — CEFAZOLIN SODIUM-DEXTROSE 2-4 GM/100ML-% IV SOLN
2.0000 g | INTRAVENOUS | Status: AC
  Administered 2021-12-31: 2 g via INTRAVENOUS

## 2021-12-31 MED ORDER — FENTANYL CITRATE (PF) 100 MCG/2ML IJ SOLN
100.0000 ug | Freq: Once | INTRAMUSCULAR | Status: AC
  Administered 2021-12-31: 50 ug via INTRAVENOUS

## 2021-12-31 MED ORDER — PHENYLEPHRINE HCL (PRESSORS) 10 MG/ML IV SOLN
INTRAVENOUS | Status: AC
  Filled 2021-12-31: qty 1

## 2021-12-31 MED ORDER — EPHEDRINE 5 MG/ML INJ
INTRAVENOUS | Status: AC
  Filled 2021-12-31: qty 5

## 2021-12-31 MED ORDER — VANCOMYCIN HCL 1000 MG IV SOLR
INTRAVENOUS | Status: DC | PRN
  Administered 2021-12-31: 1000 mg via TOPICAL

## 2021-12-31 MED ORDER — ACETAMINOPHEN 500 MG PO TABS
1000.0000 mg | ORAL_TABLET | Freq: Once | ORAL | Status: AC
  Administered 2021-12-31: 1000 mg via ORAL

## 2021-12-31 MED ORDER — PHENYLEPHRINE 80 MCG/ML (10ML) SYRINGE FOR IV PUSH (FOR BLOOD PRESSURE SUPPORT)
PREFILLED_SYRINGE | INTRAVENOUS | Status: AC
  Filled 2021-12-31: qty 10

## 2021-12-31 MED ORDER — LACTATED RINGERS IV SOLN: INTRAVENOUS | Status: DC

## 2021-12-31 MED ORDER — ROCURONIUM BROMIDE 100 MG/10ML IV SOLN
INTRAVENOUS | Status: DC | PRN
  Administered 2021-12-31: 50 mg via INTRAVENOUS

## 2021-12-31 MED ORDER — OXYCODONE HCL 5 MG PO TABS
ORAL_TABLET | ORAL | Status: AC
  Filled 2021-12-31: qty 1

## 2021-12-31 MED ORDER — OXYCODONE HCL 5 MG PO TABS
5.0000 mg | ORAL_TABLET | Freq: Once | ORAL | Status: AC | PRN
  Administered 2021-12-31: 5 mg via ORAL

## 2021-12-31 MED ORDER — PROPOFOL 10 MG/ML IV BOLUS
INTRAVENOUS | Status: AC
  Filled 2021-12-31: qty 20

## 2021-12-31 MED ORDER — LIDOCAINE 2% (20 MG/ML) 5 ML SYRINGE
INTRAMUSCULAR | Status: AC
  Filled 2021-12-31: qty 5

## 2021-12-31 MED ORDER — PHENYLEPHRINE HCL-NACL 20-0.9 MG/250ML-% IV SOLN
INTRAVENOUS | Status: DC | PRN
  Administered 2021-12-31: 50 ug/min via INTRAVENOUS

## 2021-12-31 MED ORDER — ONDANSETRON HCL 4 MG/2ML IJ SOLN
INTRAMUSCULAR | Status: DC | PRN
  Administered 2021-12-31: 4 mg via INTRAVENOUS

## 2021-12-31 MED ORDER — SUGAMMADEX SODIUM 200 MG/2ML IV SOLN
INTRAVENOUS | Status: DC | PRN
  Administered 2021-12-31: 148.8 mg via INTRAVENOUS

## 2021-12-31 MED ORDER — FENTANYL CITRATE (PF) 100 MCG/2ML IJ SOLN
INTRAMUSCULAR | Status: AC
  Filled 2021-12-31: qty 2

## 2021-12-31 MED ORDER — GABAPENTIN 300 MG PO CAPS: 300.0000 mg | ORAL_CAPSULE | Freq: Once | ORAL | Status: DC

## 2021-12-31 MED ORDER — LIDOCAINE HCL (CARDIAC) PF 100 MG/5ML IV SOSY
PREFILLED_SYRINGE | INTRAVENOUS | Status: DC | PRN
  Administered 2021-12-31: 40 mg via INTRAVENOUS

## 2021-12-31 MED ORDER — ONDANSETRON HCL 4 MG/2ML IJ SOLN: INTRAMUSCULAR | Status: DC | PRN

## 2021-12-31 MED ORDER — 0.9 % SODIUM CHLORIDE (POUR BTL) OPTIME
TOPICAL | Status: DC | PRN
  Administered 2021-12-31: 2000 mL

## 2021-12-31 MED ORDER — EPHEDRINE SULFATE (PRESSORS) 50 MG/ML IJ SOLN
INTRAMUSCULAR | Status: DC | PRN
  Administered 2021-12-31: 15 mg via INTRAVENOUS

## 2021-12-31 MED ORDER — LACTATED RINGERS IV BOLUS: 250.0000 mL | Freq: Once | INTRAVENOUS | Status: DC

## 2021-12-31 MED ORDER — BUPIVACAINE-EPINEPHRINE (PF) 0.5% -1:200000 IJ SOLN
INTRAMUSCULAR | Status: DC | PRN
  Administered 2021-12-31: 15 mL via PERINEURAL

## 2021-12-31 MED ORDER — ACETAMINOPHEN 500 MG PO TABS: 1000.0000 mg | ORAL_TABLET | Freq: Three times a day (TID) | ORAL | 0 refills | Status: AC

## 2021-12-31 MED ORDER — FENTANYL CITRATE (PF) 100 MCG/2ML IJ SOLN
INTRAMUSCULAR | Status: DC | PRN
  Administered 2021-12-31: 50 ug via INTRAVENOUS

## 2021-12-31 MED ORDER — DEXAMETHASONE SODIUM PHOSPHATE 10 MG/ML IJ SOLN
INTRAMUSCULAR | Status: AC
  Filled 2021-12-31: qty 1

## 2021-12-31 MED ORDER — ROCURONIUM BROMIDE 10 MG/ML (PF) SYRINGE
PREFILLED_SYRINGE | INTRAVENOUS | Status: AC
  Filled 2021-12-31: qty 10

## 2021-12-31 MED ORDER — CEPHALEXIN 500 MG PO CAPS: 500.0000 mg | ORAL_CAPSULE | Freq: Two times a day (BID) | ORAL | 0 refills | Status: AC

## 2021-12-31 MED ORDER — PROPOFOL 10 MG/ML IV BOLUS
INTRAVENOUS | Status: DC | PRN
  Administered 2021-12-31: 100 mg via INTRAVENOUS

## 2021-12-31 MED ORDER — LACTATED RINGERS IV BOLUS: 500.0000 mL | Freq: Once | INTRAVENOUS | Status: DC

## 2021-12-31 MED ORDER — ACETAMINOPHEN 500 MG PO TABS: 1000.0000 mg | ORAL_TABLET | Freq: Once | ORAL | Status: AC

## 2021-12-31 MED ORDER — ONDANSETRON HCL 4 MG/2ML IJ SOLN
INTRAMUSCULAR | Status: AC
  Filled 2021-12-31: qty 2

## 2021-12-31 MED ORDER — ONDANSETRON HCL 4 MG PO TABS: 4.0000 mg | ORAL_TABLET | Freq: Three times a day (TID) | ORAL | 0 refills | Status: AC | PRN

## 2021-12-31 MED ORDER — DEXAMETHASONE SODIUM PHOSPHATE 4 MG/ML IJ SOLN
INTRAMUSCULAR | Status: DC | PRN
  Administered 2021-12-31: 10 mg via INTRAVENOUS

## 2021-12-31 MED ORDER — FENTANYL CITRATE (PF) 100 MCG/2ML IJ SOLN: 25.0000 ug | INTRAMUSCULAR | Status: DC | PRN

## 2021-12-31 MED ORDER — VANCOMYCIN HCL 1000 MG IV SOLR
INTRAVENOUS | Status: AC
  Filled 2021-12-31: qty 20

## 2021-12-31 MED ORDER — OXYCODONE HCL 5 MG/5ML PO SOLN: 5.0000 mg | Freq: Once | ORAL | Status: AC | PRN

## 2021-12-31 SURGICAL SUPPLY — 68 items
AID PSTN UNV HD RSTRNT DISP (MISCELLANEOUS) ×1
APL PRP STRL LF DISP 70% ISPRP (MISCELLANEOUS) ×1
AUG BASEPLATE 15DEG 25 WEDGE (Joint) ×1 IMPLANT
AUGMENT BASEPLATE 15DEG 25 WDG (Joint) IMPLANT
BIT DRILL 3.2 PERIPHERAL SCREW (BIT) IMPLANT
BLADE SAW SGTL 73X25 THK (BLADE) ×1 IMPLANT
BLADE SURG 10 STRL SS (BLADE) IMPLANT
BLADE SURG 15 STRL LF DISP TIS (BLADE) IMPLANT
BLADE SURG 15 STRL SS (BLADE)
BRUSH SCRUB EZ PLAIN DRY (MISCELLANEOUS) ×1 IMPLANT
BSPLAT GLND 15D 25 FULL WDG (Joint) ×1 IMPLANT
CHLORAPREP W/TINT 26 (MISCELLANEOUS) ×1 IMPLANT
CLSR STERI-STRIP ANTIMIC 1/2X4 (GAUZE/BANDAGES/DRESSINGS) ×1 IMPLANT
COOLER ICEMAN CLASSIC (MISCELLANEOUS) ×1 IMPLANT
COVER BACK TABLE 60X90IN (DRAPES) ×1 IMPLANT
COVER MAYO STAND STRL (DRAPES) ×1 IMPLANT
DRAPE IMP U-DRAPE 54X76 (DRAPES) IMPLANT
DRAPE INCISE IOBAN 66X45 STRL (DRAPES) ×1 IMPLANT
DRAPE POUCH INSTRU U-SHP 10X18 (DRAPES) ×1 IMPLANT
DRAPE U-SHAPE 76X120 STRL (DRAPES) ×2 IMPLANT
DRSG AQUACEL AG ADV 3.5X 6 (GAUZE/BANDAGES/DRESSINGS) ×1 IMPLANT
ELECT BLADE 4.0 EZ CLEAN MEGAD (MISCELLANEOUS) ×1
ELECT REM PT RETURN 9FT ADLT (ELECTROSURGICAL) ×1
ELECTRODE BLDE 4.0 EZ CLN MEGD (MISCELLANEOUS) ×1 IMPLANT
ELECTRODE REM PT RTRN 9FT ADLT (ELECTROSURGICAL) ×1 IMPLANT
FACESHIELD WRAPAROUND (MASK) ×2 IMPLANT
FACESHIELD WRAPAROUND OR TEAM (MASK) ×2 IMPLANT
GLENOSPHERE STANDARD 39 (Joint) ×1 IMPLANT
GLENOSPHERE STD 39 (Joint) IMPLANT
GLOVE BIO SURGEON STRL SZ 6.5 (GLOVE) ×2 IMPLANT
GLOVE BIOGEL PI IND STRL 6.5 (GLOVE) ×1 IMPLANT
GLOVE BIOGEL PI IND STRL 8 (GLOVE) ×1 IMPLANT
GLOVE ECLIPSE 8.0 STRL XLNG CF (GLOVE) ×2 IMPLANT
GOWN STRL REUS W/ TWL LRG LVL3 (GOWN DISPOSABLE) ×2 IMPLANT
GOWN STRL REUS W/TWL LRG LVL3 (GOWN DISPOSABLE) ×2
GOWN STRL REUS W/TWL XL LVL3 (GOWN DISPOSABLE) ×1 IMPLANT
GUIDE PIN 3X75 SHOULDER (PIN) ×1
GUIDEWIRE GLENOID 2.5X220 (WIRE) IMPLANT
HANDPIECE INTERPULSE COAX TIP (DISPOSABLE) ×1
INSERT HUM PERF 3/4 39 +0 (Insert) IMPLANT
KIT STABILIZATION SHOULDER (MISCELLANEOUS) ×1 IMPLANT
MANIFOLD NEPTUNE II (INSTRUMENTS) ×1 IMPLANT
PACK BASIN DAY SURGERY FS (CUSTOM PROCEDURE TRAY) ×1 IMPLANT
PACK SHOULDER (CUSTOM PROCEDURE TRAY) ×1 IMPLANT
PAD COLD SHLDR WRAP-ON (PAD) ×1 IMPLANT
PAD ORTHO SHOULDER 7X19 LRG (SOFTGOODS) IMPLANT
PIN GUIDE 3X75 SHOULDER (PIN) IMPLANT
RESTRAINT HEAD UNIVERSAL NS (MISCELLANEOUS) ×1 IMPLANT
SCREW 5.5X26 (Screw) IMPLANT
SCREW BONE THREAD 6.5X35 (Screw) IMPLANT
SCREW PERIPHERAL 30 (Screw) IMPLANT
SET HNDPC FAN SPRY TIP SCT (DISPOSABLE) ×1 IMPLANT
SHEET MEDIUM DRAPE 40X70 STRL (DRAPES) ×1 IMPLANT
SLEEVE SCD COMPRESS KNEE MED (STOCKING) ×1 IMPLANT
SPIKE FLUID TRANSFER (MISCELLANEOUS) IMPLANT
SPONGE T-LAP 18X18 ~~LOC~~+RFID (SPONGE) ×1 IMPLANT
STEM HUMERAL PLUS LONG SZ3 (Orthopedic Implant) IMPLANT
SUT ETHIBOND 2 V 37 (SUTURE) ×1 IMPLANT
SUT ETHIBOND NAB CT1 #1 30IN (SUTURE) ×1 IMPLANT
SUT FIBERWIRE #5 38 CONV NDL (SUTURE) ×4
SUT MNCRL AB 4-0 PS2 18 (SUTURE) ×1 IMPLANT
SUT VIC AB 0 CT1 27 (SUTURE) ×1
SUT VIC AB 0 CT1 27XBRD ANBCTR (SUTURE) IMPLANT
SUT VIC AB 3-0 SH 27 (SUTURE) ×1
SUT VIC AB 3-0 SH 27X BRD (SUTURE) ×1 IMPLANT
SUTURE FIBERWR #5 38 CONV NDL (SUTURE) ×4 IMPLANT
TOWEL GREEN STERILE FF (TOWEL DISPOSABLE) ×3 IMPLANT
TUBE SUCTION HIGH CAP CLEAR NV (SUCTIONS) ×1 IMPLANT

## 2021-12-31 NOTE — Interval H&P Note (Signed)
All questions answered, patient wants to proceed with procedure. ? ?

## 2021-12-31 NOTE — Anesthesia Procedure Notes (Signed)
Anesthesia Regional Block: Interscalene brachial plexus block   Pre-Anesthetic Checklist: , timeout performed,  Correct Patient, Correct Site, Correct Laterality,  Correct Procedure, Correct Position, site marked,  Risks and benefits discussed,  Pre-op evaluation,  At surgeon's request and post-op pain management  Laterality: Right  Prep: Maximum Sterile Barrier Precautions used, chloraprep       Needles:  Injection technique: Single-shot  Needle Type: Echogenic Stimulator Needle     Needle Length: 4cm  Needle Gauge: 22     Additional Needles:   Procedures:,,,, ultrasound used (permanent image in chart),,    Narrative:  Start time: 12/31/2021 7:17 AM End time: 12/31/2021 7:20 AM Injection made incrementally with aspirations every 5 mL.  Performed by: Personally  Anesthesiologist: Brennan Bailey, MD  Additional Notes: Risks, benefits, and alternative discussed. Patient gave consent for procedure. Patient prepped and draped in sterile fashion. Sedation administered, patient remains easily responsive to voice. Relevant anatomy identified with ultrasound guidance. Local anesthetic given in 5cc increments with no signs or symptoms of intravascular injection. No pain or paraesthesias with injection. Patient monitored throughout procedure with signs of LAST or immediate complications. Tolerated well. Ultrasound image placed in chart.  Tawny Asal, MD

## 2021-12-31 NOTE — Anesthesia Postprocedure Evaluation (Signed)
Anesthesia Post Note  Patient: Wayne Mcguire  Procedure(s) Performed: REVERSE SHOULDER ARTHROPLASTY (Right: Shoulder)     Patient location during evaluation: PACU Anesthesia Type: General Level of consciousness: awake and alert Pain management: pain level controlled Vital Signs Assessment: post-procedure vital signs reviewed and stable Respiratory status: spontaneous breathing, nonlabored ventilation and respiratory function stable Cardiovascular status: blood pressure returned to baseline Postop Assessment: no apparent nausea or vomiting Anesthetic complications: no   No notable events documented.  Last Vitals:  Vitals:   12/31/21 1000 12/31/21 1028  BP: 131/64 127/80  Pulse: 61 66  Resp: 12 18  Temp:  (!) 36.3 C  SpO2: 95% 96%    Last Pain:  Vitals:   12/31/21 1028  TempSrc:   PainSc: [place]

## 2021-12-31 NOTE — Progress Notes (Signed)
Assisted Dr. Daiva Huge with right, interscalene , ultrasound guided block. Side rails up, monitors on throughout procedure. See vital signs in flow sheet. Tolerated Procedure well.

## 2021-12-31 NOTE — Anesthesia Procedure Notes (Signed)
Procedure Name: Intubation Date/Time: 12/31/2021 7:45 AM  Performed by: Ezequiel Kayser, CRNAPre-anesthesia Checklist: Patient identified, Emergency Drugs available, Suction available and Patient being monitored Patient Re-evaluated:Patient Re-evaluated prior to induction Oxygen Delivery Method: Circle System Utilized Preoxygenation: Pre-oxygenation with 100% oxygen Induction Type: IV induction Ventilation: Mask ventilation without difficulty Laryngoscope Size: Mac and 4 Grade View: Grade I Tube type: Oral Tube size: 7.0 mm Number of attempts: 1 Airway Equipment and Method: Stylet and Oral airway Placement Confirmation: ETT inserted through vocal cords under direct vision, positive ETCO2 and breath sounds checked- equal and bilateral Secured at: 22 cm Tube secured with: Tape Dental Injury: Teeth and Oropharynx as per pre-operative assessment

## 2021-12-31 NOTE — Op Note (Signed)
Orthopaedic Surgery Operative Note (CSN: [id number])  Wayne Mcguire  01-Feb-1933 Date of Surgery: 12/31/2021   Diagnoses:  End stage primary right glenohumeral arthritis  Procedure: Right reverse augmented lateralized Total Shoulder Arthroplasty   Operative Finding Successful completion of planned procedure.  Patient's posterior wear was more significant than the initial CT seem to state.  We used a wedged augment.  Tug test was normal, cuff was torn in regards to the subscapularis and was thin and an anatomic total shoulder would not of been a good procedure for this patient.  Post-operative plan: The patient will be NWB in sling.  The patient will be will be discharged from PACU if continues to be stable as was plan prior to surgery.  DVT prophylaxis Aspirin 81 mg twice daily for 6 weeks.  Pain control with PRN pain medication preferring oral medicines.  Follow up plan will be scheduled in approximately 7 days for incision check and XR.  Physical therapy to start after 4 weeks.  Patient's bone quality was relatively poor so we are holding on therapy for 4 weeks and use longstem's to try and augment this.  Implants: Tornier 3 long perform humeral stem, 0 polyethylene, 39 standard glenosphere, 25 full wedge baseplate with a 35 center screw and 4 peripheral screws  Post-Op Diagnosis: Same Surgeons:Primary: Hiram Gash, MD Assistants:Caroline McBane PA-C Location: [facility] ROOM 6 Anesthesia: General with Exparel Interscalene Antibiotics: Ancef 2g preop, Vancomycin '1000mg'$  locally Tourniquet time: None Estimated Blood Loss: 938 Complications: None Specimens: None Implants: Implant Name Type Inv. Item Serial No. Manufacturer Lot No. LRB No. Used Action  AUG BASEPLATE 15DEG 25 WEDGE - [id number] Joint AUG BASEPLATE 15DEG 25 WEDGE [id number] [employer]  Right 1 Implanted  GLENOSPHERE STANDARD 39 - [id number] Joint GLENOSPHERE STANDARD 39 [id number] [employer]  Right 1 Implanted  SCREW BONE  THREAD 6.5X35 - [id number] Screw SCREW BONE THREAD 6.5X35  [employer] ON STERILE TRAY Right 1 Implanted  SCREW 5.5X26 - [id number] Screw SCREW 5.5X26  [employer] ON STERILE TRAY Right 3 Implanted  SCREW PERIPHERAL 30 - [id number] Screw SCREW PERIPHERAL 30  [employer] ON STERILE TRAY Right 1 Implanted  INSERT HUM PERF 3/4 39 +0 - [id number] Insert INSERT HUM PERF 3/4 39 +0 [id number] [employer]  Right 1 Implanted  Humeral Stem Plus, Long 3+ Tornier Perform   [id number] [employer]  Right 1 Implanted    Indications for Surgery:   Wayne Mcguire is a 88 y.o. male with end-stage glenohumeral arthritis with attritional cuff tear in regards to subscapularis.  Benefits and risks of operative and nonoperative management were discussed prior to surgery with patient/guardian(s) and informed consent form was completed.  Infection and need for further surgery were discussed as was prosthetic stability and cuff issues.  We additionally specifically discussed risks of axillary nerve injury, infection, periprosthetic fracture, continued pain and longevity of implants prior to beginning procedure.      Procedure:   The patient was identified in the preoperative holding area where the surgical site was marked. Block placed by anesthesia with exparel.  The patient was taken to the OR where a procedural timeout was called and the above noted anesthesia was induced.  The patient was positioned beachchair on allen table with spider arm positioner.  Preoperative antibiotics were dosed.  The patient's right shoulder was prepped and draped in the usual sterile fashion.  A second preoperative timeout was called.       Standard deltopectoral approach was  performed with a #10 blade. We dissected down to the subcutaneous tissues and the cephalic vein was taken laterally with the deltoid. Clavipectoral fascia was incised in line with the incision. Deep retractors were placed. The long of the biceps tendon was  identified and there was significant tenosynovitis present.  Tenodesis was performed to the pectoralis tendon with #2 Ethibond. The remaining biceps was followed up into the rotator interval where it was released.   The subscapularis was taken down in a full thickness layer with capsule along the humeral neck extending inferiorly around the humeral head. We continued releasing the capsule directly off of the osteophytes inferiorly all the way around the corner. This allowed [place] to dislocate the humeral head.   The humeral head had evidence of severe osteoarthritic wear with full-thickness cartilage loss and exposed subchondral bone. There was significant flattening of the humeral head.   The rotator cuff was carefully examined and noted to be irreperably torn.  The decision was confirmed that a reverse total shoulder was indicated for this patient.  There were osteophytes along the inferior humeral neck. The osteophytes were removed with an osteotome and a rongeur.  Osteophytes were removed with a rongeur and an osteotome and the anatomic neck was well visualized.     A humeral cutting guide was used extra medullary with a pin to help control version. The version was set at 20 of retroversion. Humeral osteotomy was performed with an oscillating saw. The head fragment was passed off the back table.  A cut protector plate was placed.  The subscapularis was again identified and immediately we took care to palpate the axillary nerve anteriorly and verify its position with gentle palpation as well as the tug test.  We then released the SGHL with bovie cautery prior to placing a curved mayo at the junction of the anterior glenoid well above the axillary nerve and bluntly dissecting the subscapularis from the capsule.  We then carefully protected the axillary nerve as we gently released the inferior capsule to fully mobilize the subscapularis.  An anterior deltoid retractor was then placed as well as a small  Hohmann retractor superiorly.   The glenoid was inspected and had evidence of severe osteoarthritic wear with full-thickness cartilage loss and exposed subchondral bone.   The remaining labrum was removed circumferentially taking great care not to disrupt the posterior capsule.   At this point we felt based on templating that a full wedge augment was necessary.  We began by using a full wedge guide to place our center pin as was templated.  We had good position of this pin and we proceeded with our starter center drill.  This allowed for [place] to use the 15 degree full wedge reamer obtaining circumferential witness marks and good bone preparation for ingrowth.  At this point we proceeded with our center drill and had an intact vault.  We then drilled our center screw to a length of 35 mm.    We selected a 6.5 mm x 35 mm screw and the full wedge baseplate which was placed in the same orientation as our reaming.  We double checked that we had good apposition of the base plate to bone and then proceeded to place 3 locking screws and one nonlocking screw as is typical.  Next a 39 mm glenosphere was selected and impacted onto the baseplate. The center screw was tightened.  We turned attention back to the humeral side. The cut protector was removed.  We used  the perform humeral sizing block to select the appropriate size which for this patient was a 3.  We then placed our center pin and reamed over it concentrically obtaining appropriate inset.  We then used our lateralizing chisel to prepare the lateral aspect of the humerus.  At that point we selected the appropriate implant trialing a 3 long as the patient's bone quality was relatively poor.  Using this trial implant we trialed multiple polyethylene sizes settling on a 0 which provided good stability and range of motion without excess soft tissue tension. The offset was dialed in to match the normal anatomy. The shoulder was trialed.  There was good ROM in all  planes and the shoulder was stable with no inferior translation.  The real humeral implants were opened after again confirming sizes.  The trial was removed. #5 Fiberwire x4 sutures passed through the humeral neck for subscap repair. The humeral component was press-fit obtaining a secure fit. The joint was reduced and thoroughly irrigated with pulsatile lavage. Subscap was repaired back with #5 Fiberwire sutures through bone tunnels. Hemostasis was obtained. The deltopectoral interval was reapproximated with #1 Ethibond. The subcutaneous tissues were closed with 2-0 Vicryl and the skin was closed with running monocryl.    The wounds were cleaned and dried and an Aquacel dressing was placed. The drapes taken down. The arm was placed into sling with abduction pillow. Patient was awakened, extubated, and transferred to the recovery room in stable condition. There were no intraoperative complications. The sponge, needle, and attention counts were  correct at the end of the case.     Noemi Chapel, PA-C, present and scrubbed throughout the case, critical for completion in a timely fashion, and for retraction, instrumentation, closure.

## 2021-12-31 NOTE — Transfer of Care (Signed)
Immediate Anesthesia Transfer of Care Note  Patient: Wayne Mcguire  Procedure(s) Performed: REVERSE SHOULDER ARTHROPLASTY (Right: Shoulder)  Patient Location: PACU  Anesthesia Type:General and Regional  Level of Consciousness: drowsy  Airway & Oxygen Therapy: Patient Spontanous Breathing and Patient connected to face mask oxygen  Post-op Assessment: Report given to RN and Post -op Vital signs reviewed and stable  Post vital signs: Reviewed and stable  Last Vitals:  Vitals Value Taken Time  BP 134/62 12/31/21 0907  Temp 97.3   Pulse 65 12/31/21 0908  Resp 16 12/31/21 0908  SpO2 100 % 12/31/21 0908  Vitals shown include unvalidated device data.  Last Pain:  Vitals:   12/31/21 0644  TempSrc: [place]  PainSc: 2       Patients Stated Pain Goal: 2 (71/95/97 4718)  Complications: No notable events documented.

## 2022-01-01 ENCOUNTER — Encounter (HOSPITAL_BASED_OUTPATIENT_CLINIC_OR_DEPARTMENT_OTHER): Payer: Self-pay | Admitting: Orthopaedic Surgery

## 2022-01-01 NOTE — Progress Notes (Signed)
Line busy

## 2022-01-04 DIAGNOSIS — I83893 Varicose veins of bilateral lower extremities with other complications: Secondary | ICD-10-CM | POA: Diagnosis not present

## 2022-01-04 DIAGNOSIS — I83892 Varicose veins of left lower extremities with other complications: Secondary | ICD-10-CM | POA: Diagnosis not present

## 2022-01-25 DIAGNOSIS — I83892 Varicose veins of left lower extremities with other complications: Secondary | ICD-10-CM | POA: Diagnosis not present

## 2022-02-08 DIAGNOSIS — K219 Gastro-esophageal reflux disease without esophagitis: Secondary | ICD-10-CM | POA: Diagnosis not present

## 2022-02-08 DIAGNOSIS — H33012 Retinal detachment with single break, left eye: Secondary | ICD-10-CM | POA: Diagnosis not present

## 2022-02-08 DIAGNOSIS — I4891 Unspecified atrial fibrillation: Secondary | ICD-10-CM | POA: Diagnosis not present

## 2022-02-08 DIAGNOSIS — H33002 Unspecified retinal detachment with retinal break, left eye: Secondary | ICD-10-CM | POA: Diagnosis not present

## 2022-02-16 DIAGNOSIS — M25511 Pain in right shoulder: Secondary | ICD-10-CM | POA: Diagnosis not present

## 2022-02-16 DIAGNOSIS — Z96611 Presence of right artificial shoulder joint: Secondary | ICD-10-CM | POA: Diagnosis not present

## 2022-03-02 DIAGNOSIS — M25511 Pain in right shoulder: Secondary | ICD-10-CM | POA: Diagnosis not present

## 2022-03-02 DIAGNOSIS — Z96611 Presence of right artificial shoulder joint: Secondary | ICD-10-CM | POA: Diagnosis not present

## 2022-03-09 DIAGNOSIS — Z96611 Presence of right artificial shoulder joint: Secondary | ICD-10-CM | POA: Diagnosis not present

## 2022-03-09 DIAGNOSIS — M25511 Pain in right shoulder: Secondary | ICD-10-CM | POA: Diagnosis not present

## 2022-03-16 DIAGNOSIS — M25511 Pain in right shoulder: Secondary | ICD-10-CM | POA: Diagnosis not present

## 2022-03-16 DIAGNOSIS — Z96611 Presence of right artificial shoulder joint: Secondary | ICD-10-CM | POA: Diagnosis not present

## 2022-03-22 DIAGNOSIS — R109 Unspecified abdominal pain: Secondary | ICD-10-CM | POA: Diagnosis not present

## 2022-03-22 DIAGNOSIS — R3129 Other microscopic hematuria: Secondary | ICD-10-CM | POA: Diagnosis not present

## 2022-03-22 DIAGNOSIS — N3001 Acute cystitis with hematuria: Secondary | ICD-10-CM | POA: Diagnosis not present

## 2022-03-25 DIAGNOSIS — M25511 Pain in right shoulder: Secondary | ICD-10-CM | POA: Diagnosis not present

## 2022-03-25 DIAGNOSIS — Z96611 Presence of right artificial shoulder joint: Secondary | ICD-10-CM | POA: Diagnosis not present

## 2022-03-31 DIAGNOSIS — Z96611 Presence of right artificial shoulder joint: Secondary | ICD-10-CM | POA: Diagnosis not present

## 2022-03-31 DIAGNOSIS — M25511 Pain in right shoulder: Secondary | ICD-10-CM | POA: Diagnosis not present

## 2022-04-02 DIAGNOSIS — S39012A Strain of muscle, fascia and tendon of lower back, initial encounter: Secondary | ICD-10-CM | POA: Diagnosis not present

## 2022-04-06 DIAGNOSIS — Z96611 Presence of right artificial shoulder joint: Secondary | ICD-10-CM | POA: Diagnosis not present

## 2022-04-06 DIAGNOSIS — M25511 Pain in right shoulder: Secondary | ICD-10-CM | POA: Diagnosis not present

## 2022-04-07 DIAGNOSIS — I83812 Varicose veins of left lower extremities with pain: Secondary | ICD-10-CM | POA: Diagnosis not present

## 2022-04-07 DIAGNOSIS — Z936 Other artificial openings of urinary tract status: Secondary | ICD-10-CM | POA: Diagnosis not present

## 2022-04-19 DIAGNOSIS — Z7901 Long term (current) use of anticoagulants: Secondary | ICD-10-CM | POA: Diagnosis not present

## 2022-04-19 DIAGNOSIS — J189 Pneumonia, unspecified organism: Secondary | ICD-10-CM | POA: Diagnosis not present

## 2022-04-19 DIAGNOSIS — R0989 Other specified symptoms and signs involving the circulatory and respiratory systems: Secondary | ICD-10-CM | POA: Diagnosis not present

## 2022-04-19 DIAGNOSIS — I9589 Other hypotension: Secondary | ICD-10-CM | POA: Diagnosis not present

## 2022-04-19 DIAGNOSIS — S0083XA Contusion of other part of head, initial encounter: Secondary | ICD-10-CM | POA: Diagnosis not present

## 2022-04-19 DIAGNOSIS — Z936 Other artificial openings of urinary tract status: Secondary | ICD-10-CM | POA: Diagnosis not present

## 2022-04-19 DIAGNOSIS — R296 Repeated falls: Secondary | ICD-10-CM | POA: Diagnosis not present

## 2022-04-19 DIAGNOSIS — Z720 Tobacco use: Secondary | ICD-10-CM | POA: Diagnosis not present

## 2022-04-21 DIAGNOSIS — I950 Idiopathic hypotension: Secondary | ICD-10-CM | POA: Diagnosis not present

## 2022-04-29 ENCOUNTER — Encounter: Payer: Self-pay | Admitting: Cardiology

## 2022-04-29 ENCOUNTER — Ambulatory Visit: Payer: Medicare HMO | Attending: Cardiology | Admitting: Cardiology

## 2022-04-29 VITALS — BP 90/60 | HR 60 | Ht 73.0 in | Wt 163.0 lb

## 2022-04-29 DIAGNOSIS — I472 Ventricular tachycardia, unspecified: Secondary | ICD-10-CM

## 2022-04-29 DIAGNOSIS — I480 Paroxysmal atrial fibrillation: Secondary | ICD-10-CM | POA: Diagnosis not present

## 2022-04-29 DIAGNOSIS — J431 Panlobular emphysema: Secondary | ICD-10-CM

## 2022-04-29 DIAGNOSIS — I950 Idiopathic hypotension: Secondary | ICD-10-CM

## 2022-04-29 DIAGNOSIS — I4729 Other ventricular tachycardia: Secondary | ICD-10-CM

## 2022-04-29 DIAGNOSIS — Z7901 Long term (current) use of anticoagulants: Secondary | ICD-10-CM | POA: Diagnosis not present

## 2022-04-29 NOTE — Progress Notes (Signed)
Cardiology Office Note:    Date:  04/29/2022   ID:  Wayne Mcguire, DOB Nov 25, 1932, MRN [id number]  PCP:  [name], MD  Cardiologist:  [name], MD    Referring MD: [name], MD   Chief Complaint  Patient presents with   Follow-up  Falls  History of Present Illness:    Wayne Mcguire is a 89 y.o. male with past medical history significant for paroxysmal atrial fibrillation, atrial flutter, status post atrial flutter ablation done in February 2022, essential hypertension now idiopathic hypotension, nonsustained ventricular tachycardia.  Comes today to months for follow-up.  He feels down few times his blood pressure is on the lower side.  Primary care physician started him on midodrine but initially he was taking midodrine only at evening time which of course is chronic now he takes midodrine 3 times daily but do not see much effect of it I talked majority of them to his wife he always very quiet when she answered all questions she tells me that recently admitted and has been increased to 10 mg 3 times daily and the reason for that is they did not get results that expected from the medication when asked her exactly what can of affect she was expecting to see she is also at high blood pressure.  I told her to keep taking midodrine 3 times a day he also need to drink plenty of fluids also asked him to limit fluid intake.  Past Medical History:  Diagnosis Date   Acute non-recurrent pansinusitis 04/18/2019   Adrenal adenoma 09/30/2017   Adult situational stress disorder    Anxiety    Arthritis of left glenohumeral joint 06/20/2019   Bladder cancer ([place])    Bloody stool 11/27/2019   Chest pain in adult 08/23/2016   Stress echo test done 09/05/15 Normal at 7 mets       Chronic anticoagulation 11/15/2016   Chronic anxiety 09/09/2016   Chronic insomnia 09/09/2016   Chronic left shoulder pain 06/09/2020   Chronic pain of both knees 10/06/2017   Chronic pain of both  shoulders 09/14/2018   Compression fracture of T12 vertebra ([place]) 02/17/2021   Constipation 10/02/2018   Costochondral chest pain 09/07/2016   Cough with hemoptysis 01/06/2017   Current mild episode of major depressive disorder ([place]) 09/29/2020   Depression    Diarrhea 10/02/2018   Dysrhythmia    Dysuria 01/06/2017   Emphysema lung ([place])    current smoker   Esophagitis    GERD (gastroesophageal reflux disease)    GERD without esophagitis 09/09/2016   H/O total shoulder replacement, 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   Hematuria 03/18/2020   High risk medication use 08/23/2016   [facility] discharge follow-up 12/24/2019   Hypotension 03/10/2018   Left lower quadrant abdominal pain 11/27/2019   Low back pain 11/06/2018   Lumbar herniated disc    Lung nodule < 6cm on CT 09/30/2017   Malaise and fatigue 09/30/2017   Medicare annual wellness visit, subsequent 09/30/2017   Nonsustained ventricular tachycardia ([place])    Orchitis 11/06/2018   PAF (paroxysmal atrial fibrillation) ([place]) 08/23/2016   CHADS2 vasc=2, he has elected not to accept anticoagulation   Paroxysmal atrial flutter ([place]) 01/25/2020   Penile bleeding 10/02/2018   Penile discharge 11/06/2018   Peripheral edema 11/06/2018   Persistent atrial fibrillation ([place]) 01/10/2020   Primary osteoarthritis involving multiple joints 03/29/2019   Productive cough 11/06/2018   Seasonal allergies    Status post ablation of  atrial flutter 04/24/2020   Trochanteric bursitis of left hip 10/06/2017   Unsteady gait 04/03/2018   Ventricular tachycardia ([place]) 03/06/2019   Vertigo    Vitamin D deficiency 09/30/2017   Weight loss 06/09/2020    Past Surgical History:  Procedure Laterality Date   A-FLUTTER ABLATION N/A 04/03/2020   Procedure: A-FLUTTER ABLATION;  Surgeon: Constance Haw, MD;  Location: [facility] CV [facility];  Service: Cardiovascular;  Laterality: N/A;   BLADDER SURGERY     urostomy   COLONOSCOPY     LEFT HEART CATH AND  CORONARY ANGIOGRAPHY N/A 06/16/2021   Procedure: LEFT HEART CATH AND CORONARY ANGIOGRAPHY;  Surgeon: Burnell Blanks, MD;  Location: [place] [facility];  Service: Cardiovascular;  Laterality: N/A;   MOHS SURGERY Right 06/18/2020   ear   REVERSE SHOULDER ARTHROPLASTY Right 12/31/2021   Procedure: REVERSE SHOULDER ARTHROPLASTY;  Surgeon: Hiram Gash, MD;  Location: [place];  Service: Orthopedics;  Laterality: Right;   SKIN SURGERY Left 06/25/2020   hand   Synchronous Cardioversion  01/16/2020   TOTAL SHOULDER ARTHROPLASTY Left 06/20/2019   Procedure: TOTAL SHOULDER ARTHROPLASTY;  Surgeon: Hiram Gash, MD;  Location: [facility];  Service: Orthopedics;  Laterality: Left;   VEIN SURGERY Left     Current Medications: Current Meds  Medication Sig   albuterol (VENTOLIN HFA) 108 (90 Base) MCG/ACT inhaler Inhale 1 puff into the lungs every 4 (four) hours.   CRANBERRY PO Take 1 tablet by mouth daily. Unknown strength   Dextromethorphan-guaiFENesin (MUCINEX DM PO) Take 1 tablet by mouth 2 (two) times daily.   donepezil (ARICEPT) 10 MG tablet Take 10 mg by mouth at bedtime.   ELIQUIS 5 MG TABS tablet TAKE 1 TABLET BY MOUTH 2 TIMES DAILY   furosemide (LASIX) 20 MG tablet Take 20 mg by mouth daily as needed for fluid.   linaclotide (LINZESS) 72 MCG capsule Take 72 mcg by mouth daily before breakfast.   Loratadine (CLARITIN PO) Take by mouth.   magnesium oxide (MAG-OX) 400 MG tablet Take 400 mg by mouth daily.   Melatonin 10 MG TABS Take 1 tablet by mouth at bedtime.   midodrine (PROAMATINE) 10 MG tablet Take 10 mg by mouth 3 (three) times daily.   mirtazapine (REMERON) 15 MG tablet Take 15 mg by mouth at bedtime.   Multiple Vitamin (MULTIVITAMIN ADULT PO) Take 1 tablet by mouth in the morning and at bedtime. Unknown strength   Multiple Vitamins-Minerals (HAIR SKIN AND NAILS FORMULA) TABS Take 1 tablet by mouth 2 (two) times daily. Unknown strength   Multiple Vitamins-Minerals  (ICAPS AREDS 2 PO) Take 1 capsule by mouth daily. Unknown strength   nitroGLYCERIN (NITROSTAT) 0.4 MG SL tablet Place 1 tablet (0.4 mg total) under the tongue every 5 (five) minutes as needed for chest pain.   Omeprazole Magnesium (PRILOSEC PO) Take by mouth.   Peppermint Oil (IBGARD) 90 MG CPCR Take 1 capsule by mouth in the morning and at bedtime.   prednisoLONE acetate (PRED FORTE) 1 % ophthalmic suspension Place 1 drop into the left eye 4 (four) times daily.   Probiotic Product (PROBIOTIC DAILY) CAPS Take 1 capsule by mouth daily. Unknown strength   traMADol (ULTRAM) 50 MG tablet Take 50 mg by mouth at bedtime.   traZODone (DESYREL) 150 MG tablet Take 150 mg by mouth at bedtime.      Allergies:   Terbinafine and related   Social History   Socioeconomic History   Marital status:  Married    Spouse name: Not on file   Number of children: Not on file   Years of education: Not on file   Highest education level: Not on file  Occupational History   Not on file  Tobacco Use   Smoking status: Every Day    Packs/day: 1.00    Years: 70.00    Total pack years: 70.00    Types: Cigarettes   Smokeless tobacco: Never  Vaping Use   Vaping Use: Never used  Substance and Sexual Activity   Alcohol use: No   Drug use: No   Sexual activity: Not Currently  Other Topics Concern   Not on file  Social History Narrative   Not on file   Social Determinants of Health   Financial Resource Strain: Not on file  Food Insecurity: Not on file  Transportation Needs: Not on file  Physical Activity: Not on file  Stress: Not on file  Social Connections: Not on file     Family History: The patient's family history includes Heart attack in his brother; Heart failure in his father. ROS:   Please see the history of present illness.    All 14 point review of systems negative except as described per history of present illness  EKGs/Labs/Other Studies Reviewed:      Recent Labs: 06/10/2021: BUN 16;  Creatinine, Ser 1.00; Hemoglobin 13.2; Platelets 288; Potassium 4.1; Sodium 140  Recent Lipid Panel No results found for: "CHOL", "TRIG", "HDL", "CHOLHDL", "VLDL", "LDLCALC", "LDLDIRECT"  Physical Exam:    VS:  BP 90/60 (BP Location: Left Arm, Patient Position: Sitting, Cuff Size: Normal)   Pulse 60   Ht '6\' 1"'$  (1.854 m)   Wt 163 lb (73.9 kg)   SpO2 98%   BMI 21.51 kg/m     Wt Readings from Last 3 Encounters:  04/29/22 163 lb (73.9 kg)  12/31/21 164 lb 0.4 oz (74.4 kg)  10/21/21 170 lb 12.8 oz (77.5 kg)     GEN:  Well nourished, well developed in no acute distress HEENT: Normal NECK: No JVD; No carotid bruits LYMPHATICS: No lymphadenopathy CARDIAC: RRR, no murmurs, no rubs, no gallops RESPIRATORY:  Clear to auscultation without rales, wheezing or rhonchi  ABDOMEN: Soft, non-tender, non-distended MUSCULOSKELETAL:  No edema; No deformity  SKIN: Warm and dry LOWER EXTREMITIES: no swelling NEUROLOGIC:  Alert and oriented x 3 PSYCHIATRIC:  Normal affect   ASSESSMENT:    1. Idiopathic hypotension   2. Nonsustained ventricular tachycardia ([place])   3. PAF (paroxysmal atrial fibrillation) ([place])   4. Ventricular tachycardia ([place])   5. Panlobular emphysema ([place])   6. Chronic anticoagulation    PLAN:    In order of problems listed above:  Idiopathic hypotension.  On midodrine fluid will be increased salt will be given I also asked her to look for salt tablets which may be beneficial, we did discuss potentially elastic stockings however he absolutely does not want to do that. History of nonsustained ventricular tachycardia denies having any passing out look like dizziness that he is experiencing and falls are related more to orthostatic hypotension. Paroxysmal atrial fibrillation doing well from that point review, he is anticoagulated with some reservation because of frequent falls if that continues we may be forced to discontinue anticoagulation.    Medication Adjustments/Labs  and Tests Ordered: Current medicines are reviewed at length with the patient today.  Concerns regarding medicines are outlined above.  No orders of the defined types were placed in this encounter.  Medication changes: No orders of the defined types were placed in this encounter.   Signed, Park Liter, MD, [facility] 04/29/2022 2:27 PM    [facility]

## 2022-04-29 NOTE — Patient Instructions (Signed)
Medication Instructions:  Your physician recommends that you continue on your current medications as directed. Please refer to the Current Medication list given to you today.  *If you need a refill on your cardiac medications before your next appointment, please call your pharmacy*   Lab Work: None Ordered If you have labs (blood work) drawn today and your tests are completely normal, you will receive your results only by: [place] (if you have MyChart) OR A paper copy in the mail If you have any lab test that is abnormal or we need to change your treatment, we will call you to review the results.   Testing/Procedures: None Ordered   Follow-Up: At [facility], you and your health needs are our priority.  As part of our continuing mission to provide you with exceptional heart care, we have created designated Provider Care Teams.  These Care Teams include your primary Cardiologist (physician) and Advanced Practice Providers (APPs -  Physician Assistants and Nurse Practitioners) who all work together to provide you with the care you need, when you need it.  We recommend signing up for the patient portal called "MyChart".  Sign up information is provided on this After Visit Summary.  MyChart is used to connect with patients for Virtual Visits (Telemedicine).  Patients are able to view lab/test results, encounter notes, upcoming appointments, etc.  Non-urgent messages can be sent to your provider as well.   To learn more about what you can do with MyChart, go to [email or web address].    Your next appointment:   4 month(s)  The format for your next appointment:   In Person  Provider:   [name], MD    Other Instructions NA

## 2022-04-30 ENCOUNTER — Other Ambulatory Visit: Payer: Self-pay

## 2022-04-30 DIAGNOSIS — I480 Paroxysmal atrial fibrillation: Secondary | ICD-10-CM

## 2022-04-30 MED ORDER — NITROGLYCERIN 0.4 MG SL SUBL: 0.4000 mg | SUBLINGUAL_TABLET | SUBLINGUAL | 11 refills | Status: DC | PRN

## 2022-04-30 NOTE — Telephone Encounter (Signed)
Pt's medication was sent to pt's pharmacy as requested. Confirmation received.  °

## 2022-05-05 DIAGNOSIS — L570 Actinic keratosis: Secondary | ICD-10-CM | POA: Diagnosis not present

## 2022-05-05 DIAGNOSIS — C4442 Squamous cell carcinoma of skin of scalp and neck: Secondary | ICD-10-CM | POA: Diagnosis not present

## 2022-05-10 DIAGNOSIS — R531 Weakness: Secondary | ICD-10-CM | POA: Diagnosis not present

## 2022-05-10 DIAGNOSIS — R5383 Other fatigue: Secondary | ICD-10-CM | POA: Diagnosis not present

## 2022-05-19 DIAGNOSIS — H3581 Retinal edema: Secondary | ICD-10-CM | POA: Diagnosis not present

## 2022-05-19 DIAGNOSIS — H40052 Ocular hypertension, left eye: Secondary | ICD-10-CM | POA: Diagnosis not present

## 2022-07-09 DIAGNOSIS — I872 Venous insufficiency (chronic) (peripheral): Secondary | ICD-10-CM | POA: Diagnosis not present

## 2022-07-09 DIAGNOSIS — M79662 Pain in left lower leg: Secondary | ICD-10-CM | POA: Diagnosis not present

## 2022-07-09 DIAGNOSIS — M79605 Pain in left leg: Secondary | ICD-10-CM | POA: Diagnosis not present

## 2022-07-09 DIAGNOSIS — R600 Localized edema: Secondary | ICD-10-CM | POA: Diagnosis not present

## 2022-07-09 DIAGNOSIS — I83892 Varicose veins of left lower extremities with other complications: Secondary | ICD-10-CM | POA: Diagnosis not present

## 2022-07-22 DIAGNOSIS — I87392 Chronic venous hypertension (idiopathic) with other complications of left lower extremity: Secondary | ICD-10-CM | POA: Diagnosis not present

## 2022-07-22 DIAGNOSIS — I872 Venous insufficiency (chronic) (peripheral): Secondary | ICD-10-CM | POA: Diagnosis not present

## 2022-07-22 DIAGNOSIS — R600 Localized edema: Secondary | ICD-10-CM | POA: Diagnosis not present

## 2022-07-22 DIAGNOSIS — M7989 Other specified soft tissue disorders: Secondary | ICD-10-CM | POA: Diagnosis not present

## 2022-07-29 DIAGNOSIS — M25562 Pain in left knee: Secondary | ICD-10-CM | POA: Diagnosis not present

## 2022-07-29 DIAGNOSIS — M79605 Pain in left leg: Secondary | ICD-10-CM | POA: Diagnosis not present

## 2022-07-29 DIAGNOSIS — R102 Pelvic and perineal pain: Secondary | ICD-10-CM | POA: Diagnosis not present

## 2022-08-12 ENCOUNTER — Other Ambulatory Visit: Payer: Self-pay

## 2022-08-12 ENCOUNTER — Emergency Department (HOSPITAL_BASED_OUTPATIENT_CLINIC_OR_DEPARTMENT_OTHER): Payer: Medicare HMO

## 2022-08-12 ENCOUNTER — Emergency Department (HOSPITAL_BASED_OUTPATIENT_CLINIC_OR_DEPARTMENT_OTHER)
Admission: EM | Admit: 2022-08-12 | Discharge: 2022-08-12 | Disposition: A | Discharge status: Home / Self Care | Payer: Medicare HMO | Attending: Emergency Medicine | Admitting: Emergency Medicine

## 2022-08-12 DIAGNOSIS — S2241XA Multiple fractures of ribs, right side, initial encounter for closed fracture: Secondary | ICD-10-CM

## 2022-08-12 DIAGNOSIS — S298XXA Other specified injuries of thorax, initial encounter: Secondary | ICD-10-CM | POA: Diagnosis not present

## 2022-08-12 DIAGNOSIS — Z8551 Personal history of malignant neoplasm of bladder: Secondary | ICD-10-CM | POA: Insufficient documentation

## 2022-08-12 DIAGNOSIS — Z96612 Presence of left artificial shoulder joint: Secondary | ICD-10-CM | POA: Diagnosis not present

## 2022-08-12 DIAGNOSIS — I4891 Unspecified atrial fibrillation: Secondary | ICD-10-CM | POA: Diagnosis not present

## 2022-08-12 DIAGNOSIS — S2243XA Multiple fractures of ribs, bilateral, initial encounter for closed fracture: Secondary | ICD-10-CM | POA: Diagnosis not present

## 2022-08-12 DIAGNOSIS — Z7901 Long term (current) use of anticoagulants: Secondary | ICD-10-CM | POA: Diagnosis not present

## 2022-08-12 DIAGNOSIS — Z96611 Presence of right artificial shoulder joint: Secondary | ICD-10-CM | POA: Diagnosis not present

## 2022-08-12 DIAGNOSIS — S30860A Insect bite (nonvenomous) of lower back and pelvis, initial encounter: Secondary | ICD-10-CM | POA: Insufficient documentation

## 2022-08-12 DIAGNOSIS — S0990XA Unspecified injury of head, initial encounter: Secondary | ICD-10-CM | POA: Diagnosis not present

## 2022-08-12 DIAGNOSIS — W19XXXA Unspecified fall, initial encounter: Secondary | ICD-10-CM

## 2022-08-12 DIAGNOSIS — W57XXXA Bitten or stung by nonvenomous insect and other nonvenomous arthropods, initial encounter: Secondary | ICD-10-CM | POA: Diagnosis not present

## 2022-08-12 DIAGNOSIS — S299XXA Unspecified injury of thorax, initial encounter: Secondary | ICD-10-CM | POA: Diagnosis not present

## 2022-08-12 DIAGNOSIS — Z471 Aftercare following joint replacement surgery: Secondary | ICD-10-CM | POA: Diagnosis not present

## 2022-08-12 MED ORDER — OXYCODONE HCL 5 MG PO TABS: 5.0000 mg | ORAL_TABLET | Freq: Four times a day (QID) | ORAL | 0 refills | Status: AC | PRN

## 2022-08-12 NOTE — ED Notes (Signed)
Cannot discharge, registration in chart.   

## 2022-08-12 NOTE — ED Notes (Signed)

## 2022-08-12 NOTE — ED Notes (Signed)
Instructed to do q2 w/a at home, cough following treatment.

## 2022-08-12 NOTE — ED Triage Notes (Addendum)
Pt was found in the grass last night due to unwitnessed fall. Reports hx of dementia. Complains of right shoulder pain. Pt states he tripped. Takes Eliquis daily

## 2022-08-12 NOTE — ED Provider Notes (Signed)
[facility] Provider Note   CSN: [id number] Arrival date & time: 08/12/22  1158     History  Chief Complaint  Patient presents with   Wayne Mcguire is a 89 y.o. male.  HPI      89 year old male with a history of bladder cancer, paroxysmal atrial fibrillation and flutter on Eliquis who presents with concern for fall in the woods last night and right sided chest wall pain.  He was out walking in the woods.  His wife heard him calling out, initially did not find him as he got off Trail, but eventually did find him full milligram.  He reports he got tangled up in some branches that were on the ground and fell.  Denies loss of consciousness.  He fell onto the right side and has right-sided rib pain.  This occurred last night.  He denies shortness of breath, cough, fever, abdominal pain, nausea, vomiting, numbness, weakness, change in vision, difficulty walking or talking. Denies headache.  Past Medical History:  Diagnosis Date   Acute non-recurrent pansinusitis 04/18/2019   Adrenal adenoma 09/30/2017   Adult situational stress disorder    Anxiety    Arthritis of left glenohumeral joint 06/20/2019   Bladder cancer (HCC)    Bloody stool 11/27/2019   Chest pain in adult 08/23/2016   Stress echo test done 09/05/15 Normal at 7 mets       Chronic anticoagulation 11/15/2016   Chronic anxiety 09/09/2016   Chronic insomnia 09/09/2016   Chronic left shoulder pain 06/09/2020   Chronic pain of both knees 10/06/2017   Chronic pain of both shoulders 09/14/2018   Compression fracture of T12 vertebra (HCC) 02/17/2021   Constipation 10/02/2018   Costochondral chest pain 09/07/2016   Cough with hemoptysis 01/06/2017   Current mild episode of major depressive disorder (HCC) 09/29/2020   Depression    Diarrhea 10/02/2018   Dysrhythmia    Dysuria 01/06/2017   Emphysema lung (HCC)    current smoker   Esophagitis    GERD  (gastroesophageal reflux disease)    GERD without esophagitis 09/09/2016   H/O total shoulder replacement, 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   Hematuria 03/18/2020   High risk medication use 08/23/2016   [facility] discharge follow-up 12/24/2019   Hypotension 03/10/2018   Left lower quadrant abdominal pain 11/27/2019   Low back pain 11/06/2018   Lumbar herniated disc    Lung nodule < 6cm on CT 09/30/2017   Malaise and fatigue 09/30/2017   Medicare annual wellness visit, subsequent 09/30/2017   Nonsustained ventricular tachycardia (HCC)    Orchitis 11/06/2018   PAF (paroxysmal atrial fibrillation) (HCC) 08/23/2016   CHADS2 vasc=2, he has elected not to accept anticoagulation   Paroxysmal atrial flutter (HCC) 01/25/2020   Penile bleeding 10/02/2018   Penile discharge 11/06/2018   Peripheral edema 11/06/2018   Persistent atrial fibrillation (HCC) 01/10/2020   Primary osteoarthritis involving multiple joints 03/29/2019   Productive cough 11/06/2018   Seasonal allergies    Status post ablation of atrial flutter 04/24/2020   Trochanteric bursitis of left hip 10/06/2017   Unsteady gait 04/03/2018   Ventricular tachycardia (HCC) 03/06/2019   Vertigo    Vitamin D deficiency 09/30/2017   Weight loss 06/09/2020     Home Medications Prior to Admission medications   Medication Sig Start Date End Date Taking? Authorizing Provider  albuterol (VENTOLIN HFA) 108 (90 Base) MCG/ACT inhaler Inhale 1 puff into the lungs every  4 (four) hours. 04/19/22   [provider]  CRANBERRY PO Take 1 tablet by mouth daily. Unknown strength    [provider]  Dextromethorphan-guaiFENesin (MUCINEX DM PO) Take 1 tablet by mouth 2 (two) times daily.    [provider]  donepezil (ARICEPT) 10 MG tablet Take 10 mg by mouth at bedtime. 03/22/22   [provider]  ELIQUIS 5 MG TABS tablet TAKE 1 TABLET BY MOUTH 2 TIMES DAILY 04/08/21   Camnitz, Andree Coss, MD  furosemide (LASIX) 20 MG  tablet Take 20 mg by mouth daily as needed for fluid. 07/27/19   [provider]  linaclotide (LINZESS) 72 MCG capsule Take 72 mcg by mouth daily before breakfast.    [provider]  Loratadine (CLARITIN PO) Take by mouth.    [provider]  magnesium oxide (MAG-OX) 400 MG tablet Take 400 mg by mouth daily.    [provider]  Melatonin 10 MG TABS Take 1 tablet by mouth at bedtime.    [provider]  midodrine (PROAMATINE) 5 MG tablet Take by mouth.    [provider]  mirtazapine (REMERON) 15 MG tablet Take 15 mg by mouth at bedtime. 09/29/20   [provider]  Multiple Vitamin (MULTIVITAMIN ADULT PO) Take 1 tablet by mouth in the morning and at bedtime. Unknown strength    [provider]  Multiple Vitamins-Minerals (HAIR SKIN AND NAILS FORMULA) TABS Take 1 tablet by mouth 2 (two) times daily. Unknown strength    [provider]  Multiple Vitamins-Minerals (ICAPS AREDS 2 PO) Take 1 capsule by mouth daily. Unknown strength    [provider]  nitroGLYCERIN (NITROSTAT) 0.4 MG SL tablet Place 1 tablet (0.4 mg total) under the tongue every 5 (five) minutes as needed for chest pain. 04/30/22   Georgeanna Lea, MD  Omeprazole Magnesium (PRILOSEC PO) Take by mouth.    [provider]  Peppermint Oil (IBGARD) 90 MG CPCR Take 1 capsule by mouth in the morning and at bedtime.    [provider]  prednisoLONE acetate (PRED FORTE) 1 % ophthalmic suspension Place 1 drop into the left eye 4 (four) times daily. 04/27/22   [provider]  Probiotic Product (PROBIOTIC DAILY) CAPS Take 1 capsule by mouth daily. Unknown strength    [provider]  traMADol (ULTRAM) 50 MG tablet Take 50 mg by mouth at bedtime. 04/29/22   [provider]  traZODone (DESYREL) 150 MG tablet Take 150 mg by mouth at bedtime.  03/08/16   [provider]      Allergies    Terbinafine and related     Review of Systems   Review of Systems  Physical Exam Updated Vital Signs BP (!) 141/100   Pulse (!) 54   Temp 98.2 F (36.8 C) (Oral)   Resp 15   SpO2 94%  Physical Exam Vitals and nursing note reviewed.  Constitutional:      General: He is not in acute distress.    Appearance: He is well-developed. He is not diaphoretic.  HENT:     Head: Normocephalic and atraumatic.  Eyes:     Conjunctiva/sclera: Conjunctivae normal.  Cardiovascular:     Rate and Rhythm: Normal rate and regular rhythm.     Heart sounds: Normal heart sounds. No murmur heard.    No friction rub. No gallop.  Pulmonary:     Effort: Pulmonary effort is normal. No respiratory distress.     Breath sounds:  Normal breath sounds. No wheezing or rales.  Chest:     Chest wall: Tenderness present.  Abdominal:     General: There is no distension.     Palpations: Abdomen is soft.     Tenderness: There is no abdominal tenderness. There is no guarding.  Musculoskeletal:        General: No tenderness (cervical, thoracic, lumbar).     Cervical back: Normal range of motion.  Skin:    General: Skin is warm and dry.  Neurological:     Mental Status: He is alert and oriented to person, place, and time.     ED Results / Procedures / Treatments   Labs (all labs ordered are listed, but only abnormal results are displayed) Labs Reviewed - No data to display  EKG EKG Interpretation  Date/Time:  Thursday August 12 2022 12:39:23 EDT Ventricular Rate:  53 PR Interval:  186 QRS Duration: 94 QT Interval:  495 QTC Calculation: 465 R Axis:   9 Text Interpretation: Sinus rhythm No significant change since last tracing Confirmed by Alvira Monday ([id number]) on 08/12/2022 1:19:22 PM  Radiology CT Chest Wo Contrast  Result Date: 08/12/2022 CLINICAL DATA:  Unwitnessed fall.  Right shoulder pain. EXAM: CT CHEST WITHOUT CONTRAST TECHNIQUE: Multidetector CT imaging of the chest was performed following the standard protocol  without IV contrast. RADIATION DOSE REDUCTION: This exam was performed according to the departmental dose-optimization program which includes automated exposure control, adjustment of the mA and/or kV according to patient size and/or use of iterative reconstruction technique. COMPARISON:  CT chest 11/10/2018 FINDINGS: Cardiovascular: The heart size is normal. There is no pericardial effusion. There are extensive coronary artery calcifications, aortic valve calcifications, and mitral annular calcifications. There is calcified plaque throughout the thoracic aorta. Mediastinum/Nodes: The thyroid is unremarkable. The esophagus is grossly unremarkable. There is no mediastinal, hilar, or axillary lymphadenopathy. Lungs/Pleura: The trachea is patent. There is mucoid debris in the lower trachea and main bronchi, left more than right, with multiple areas of mucoid impaction in the left lower lobe. There is associated bronchial wall thickening. There is mild background emphysema. There is mild dependent subsegmental atelectasis in the lung bases. There is no focal consolidation or pulmonary edema. There is no pleural effusion or pneumothorax There are no suspicious nodules. Upper Abdomen: There is a 2.4 cm left adrenal nodule which is unchanged going back to at least 2020 consistent with a benign adenoma requiring no specific imaging follow-up. A left upper pole renal cyst is noted. Musculoskeletal: The patient is status post bilateral reverse shoulder arthroplasty. There are acute nondisplaced fractures of the right anterior fourth, fifth, and sixth ribs. Remote fractures of the left posterior seventh and eighth ribs are noted. There is compression deformity of the T12 vertebral body with approximately 40% loss of vertebral body height anteriorly, similar to the chest radiograph from 2023. There is no acute fracture or traumatic malalignment in the thoracic spine. IMPRESSION: 1. Acute nondisplaced fractures of the right  anterior fourth through sixth ribs. No pneumothorax or pleural effusion. 2. Mucoid debris in the lower trachea and main bronchi, left more than right, with multiple areas of mucoid impaction in the left lower lobe. 3. Emphysema. 4. Coronary artery calcifications, aortic valve calcifications, and mitral annular calcifications. Electronically Signed   By: Lesia Hausen M.D.   On: 08/12/2022 15:25   DG Shoulder Right  Result Date: 08/12/2022 CLINICAL DATA:  Trauma, fall, pain EXAM: RIGHT SHOULDER - 2+ VIEW COMPARISON:  12/31/2021 FINDINGS: There  is previous reverse arthroplasty in right shoulder. No recent fracture or dislocation is seen. IMPRESSION: Previous right shoulder arthroplasty. No recent fracture or dislocation is seen. Electronically Signed   By: Ernie Avena M.D.   On: 08/12/2022 13:02   CT Head Wo Contrast  Result Date: 08/12/2022 CLINICAL DATA:  Found down.  Unwitnessed fall.  History of dementia. EXAM: CT HEAD WITHOUT CONTRAST CT CERVICAL SPINE WITHOUT CONTRAST TECHNIQUE: Multidetector CT imaging of the head and cervical spine was performed following the standard protocol without intravenous contrast. Multiplanar CT image reconstructions of the cervical spine were also generated. RADIATION DOSE REDUCTION: This exam was performed according to the departmental dose-optimization program which includes automated exposure control, adjustment of the mA and/or kV according to patient size and/or use of iterative reconstruction technique. COMPARISON:  Remote head CT from 2010 FINDINGS: CT HEAD FINDINGS Brain: Age related cerebral atrophy, ventriculomegaly and advanced periventricular white matter disease. No extra-axial fluid collections are identified. No CT findings for acute hemispheric infarction or intracranial hemorrhage. No mass lesions. The brainstem and cerebellum are normal. Vascular: Vascular calcifications but no aneurysm hyperdense vessels. Skull: No skull fracture or bone lesions.  Sinuses/Orbits: The paranasal sinuses and mastoid air cells are clear except for scattered left mastoid effusions. The globes are intact. Other: No scalp lesions or scalp hematoma. CT CERVICAL SPINE FINDINGS Alignment: Normal Skull base and vertebrae: No acute fracture. No primary bone lesion or focal pathologic process. Soft tissues and spinal canal: No prevertebral fluid or swelling. No visible canal hematoma. Disc levels: The spinal canal is quite generous. No significant spinal or foraminal stenosis. Upper chest: The lung apices are grossly clear. Other: No neck mass, adenopathy or hematoma. Bilateral carotid artery calcifications. IMPRESSION: 1. Age related cerebral atrophy, ventriculomegaly and advanced periventricular white matter disease. 2. No acute intracranial findings or skull fracture. 3. Normal alignment of the cervical spine without acute fracture. Electronically Signed   By: Rudie Meyer M.D.   On: 08/12/2022 12:57   CT Cervical Spine Wo Contrast  Result Date: 08/12/2022 CLINICAL DATA:  Found down.  Unwitnessed fall.  History of dementia. EXAM: CT HEAD WITHOUT CONTRAST CT CERVICAL SPINE WITHOUT CONTRAST TECHNIQUE: Multidetector CT imaging of the head and cervical spine was performed following the standard protocol without intravenous contrast. Multiplanar CT image reconstructions of the cervical spine were also generated. RADIATION DOSE REDUCTION: This exam was performed according to the departmental dose-optimization program which includes automated exposure control, adjustment of the mA and/or kV according to patient size and/or use of iterative reconstruction technique. COMPARISON:  Remote head CT from 2010 FINDINGS: CT HEAD FINDINGS Brain: Age related cerebral atrophy, ventriculomegaly and advanced periventricular white matter disease. No extra-axial fluid collections are identified. No CT findings for acute hemispheric infarction or intracranial hemorrhage. No mass lesions. The brainstem  and cerebellum are normal. Vascular: Vascular calcifications but no aneurysm hyperdense vessels. Skull: No skull fracture or bone lesions. Sinuses/Orbits: The paranasal sinuses and mastoid air cells are clear except for scattered left mastoid effusions. The globes are intact. Other: No scalp lesions or scalp hematoma. CT CERVICAL SPINE FINDINGS Alignment: Normal Skull base and vertebrae: No acute fracture. No primary bone lesion or focal pathologic process. Soft tissues and spinal canal: No prevertebral fluid or swelling. No visible canal hematoma. Disc levels: The spinal canal is quite generous. No significant spinal or foraminal stenosis. Upper chest: The lung apices are grossly clear. Other: No neck mass, adenopathy or hematoma. Bilateral carotid artery calcifications. IMPRESSION: 1. Age related  cerebral atrophy, ventriculomegaly and advanced periventricular white matter disease. 2. No acute intracranial findings or skull fracture. 3. Normal alignment of the cervical spine without acute fracture. Electronically Signed   By: Rudie Meyer M.D.   On: 08/12/2022 12:57    Procedures .Foreign Body Removal  Date/Time: 08/14/2022 12:35 AM  Performed by: Alvira Monday, MD Authorized by: Alvira Monday, MD  Consent: Verbal consent obtained. Risks and benefits: risks, benefits and alternatives were discussed Consent given by: patient Required items: required blood products, implants, devices, and special equipment available Patient identity confirmed: verbally with patient Body area: skin General location: trunk Location details: back Complexity: simple 1 objects recovered. Objects recovered: tick Post-procedure assessment: foreign body removed Patient tolerance: patient tolerated the procedure well with no immediate complications      Medications Ordered in ED Medications - No data to display  ED Course/ Medical Decision Making/ A&P                              89 year old male with a  history of bladder cancer, paroxysmal atrial fibrillation and flutter on Eliquis who presents with concern for fall in the woods last night and right sided chest wall pain.  Mechanical fall while walking in the woods by history, no other acute medical concerns in discussion with patient and wife.   Given fall on eliquis, ct head/cspine obtained and show no evidence of ICH or fracture.  Shoulder XR wihtout acute abnormalities.  CT chest done given right sided tenderness and age shows fractures of right 4-6 ribs, mucoid debris in lower trachea and main bronchi.    Discussed admission with patient and wife given age and number of rib fractures, however he prefers outpatient follow up.  Injury did occur yesterday, does appear pain is controlled, able to pull 1250cc on spirometery, has normal oxygenation, no dyspnea.  Discussed can dc with very strict return precautions given high risks of rib fx at his age.   Found to have tick on lower back which was removed.  [place] tick also noted on exam and removed.  Given tick not engorged, is lone star tick, on for less than 24hr, empiric abx not indicated.  Patient discharged in stable condition with understanding of reasons to return.         Final Clinical Impression(s) / ED Diagnoses Final diagnoses:  Closed fracture of multiple ribs of right side, initial encounter  Tick bite of lower back, initial encounter  Fall, initial encounter    Rx / DC Orders ED Discharge Orders     None         Alvira Monday, MD 08/14/22 [phone]

## 2022-08-18 DIAGNOSIS — Z936 Other artificial openings of urinary tract status: Secondary | ICD-10-CM | POA: Diagnosis not present

## 2022-09-09 DIAGNOSIS — L570 Actinic keratosis: Secondary | ICD-10-CM | POA: Diagnosis not present

## 2022-09-09 DIAGNOSIS — L578 Other skin changes due to chronic exposure to nonionizing radiation: Secondary | ICD-10-CM | POA: Diagnosis not present

## 2022-09-09 DIAGNOSIS — C44229 Squamous cell carcinoma of skin of left ear and external auricular canal: Secondary | ICD-10-CM | POA: Diagnosis not present

## 2022-09-09 DIAGNOSIS — L814 Other melanin hyperpigmentation: Secondary | ICD-10-CM | POA: Diagnosis not present

## 2022-09-13 ENCOUNTER — Other Ambulatory Visit: Payer: Self-pay | Admitting: *Deleted

## 2022-09-13 ENCOUNTER — Telehealth: Payer: Self-pay | Admitting: Cardiology

## 2022-09-13 DIAGNOSIS — I480 Paroxysmal atrial fibrillation: Secondary | ICD-10-CM

## 2022-09-13 MED ORDER — APIXABAN 5 MG PO TABS: 5.0000 mg | ORAL_TABLET | Freq: Two times a day (BID) | ORAL | 3 refills | Status: DC

## 2022-09-13 NOTE — Telephone Encounter (Signed)
Eliquis 5mg  refill request received. Patient is 89 years old, weight-73.9kg, Crea-1.00 on 08/20/21 via [facility] on 08/20/21-needs updated labs, Diagnosis-Afib, and last seen by Dr. Bing Matter on 04/29/22 and has an appt in [place] on 09/16/22. Dose is appropriate based on dosing criteria.   Last refill done today by another user to selected pharmacy. Will not resend since this was sent earlier today Placed on note on upcoming 7/25 appt to obtain cbc, bmet for eliquis f/u.

## 2022-09-13 NOTE — Telephone Encounter (Signed)
Eliquis 5mg  sent on today by another user to the selected pharmacy. Will not resend since this is a duplicate. Marland Kitchen

## 2022-09-13 NOTE — Telephone Encounter (Signed)
*  STAT* If patient is at the pharmacy, call can be transferred to refill team.   1. Which medications need to be refilled? (please list name of each medication and dose if known) ELIQUIS 5 MG TABS tablet   2. Which pharmacy/location (including street and city if local pharmacy) is medication to be sent to?  [id number] - [place], [place] - [address]    3. Do they need a 30 day or 90 day supply? 90   Appt on 7/25

## 2022-09-15 DIAGNOSIS — H3581 Retinal edema: Secondary | ICD-10-CM | POA: Diagnosis not present

## 2022-09-16 ENCOUNTER — Ambulatory Visit: Payer: Medicare HMO | Attending: Cardiology | Admitting: Cardiology

## 2022-09-16 ENCOUNTER — Encounter: Payer: Self-pay | Admitting: Cardiology

## 2022-09-16 VITALS — BP 128/60 | HR 79 | Ht 73.0 in | Wt 160.2 lb

## 2022-09-16 DIAGNOSIS — Z79899 Other long term (current) drug therapy: Secondary | ICD-10-CM

## 2022-09-16 DIAGNOSIS — Z8679 Personal history of other diseases of the circulatory system: Secondary | ICD-10-CM | POA: Diagnosis not present

## 2022-09-16 DIAGNOSIS — Z7901 Long term (current) use of anticoagulants: Secondary | ICD-10-CM | POA: Diagnosis not present

## 2022-09-16 DIAGNOSIS — Z9889 Other specified postprocedural states: Secondary | ICD-10-CM

## 2022-09-16 DIAGNOSIS — I480 Paroxysmal atrial fibrillation: Secondary | ICD-10-CM

## 2022-09-16 DIAGNOSIS — I959 Hypotension, unspecified: Secondary | ICD-10-CM | POA: Diagnosis not present

## 2022-09-16 DIAGNOSIS — I2510 Atherosclerotic heart disease of native coronary artery without angina pectoris: Secondary | ICD-10-CM | POA: Diagnosis not present

## 2022-09-16 DIAGNOSIS — R0600 Dyspnea, unspecified: Secondary | ICD-10-CM | POA: Diagnosis not present

## 2022-09-16 DIAGNOSIS — I472 Ventricular tachycardia, unspecified: Secondary | ICD-10-CM

## 2022-09-16 DIAGNOSIS — I350 Nonrheumatic aortic (valve) stenosis: Secondary | ICD-10-CM

## 2022-09-16 NOTE — Progress Notes (Signed)
Cardiology Office Note:  .   Date:  09/16/2022  ID:  Wayne Mcguire, DOB 1932-04-25, MRN [id number] PCP: Hadley Pen, MD  [facility] Providers Cardiologist:  Norman Herrlich, MD Electrophysiologist:  Regan Lemming, MD    History of Present Illness: .     Wayne Mcguire is a 89 y.o. male with a past medical history of PAF on Eliquis, nonobstructive CAD, ventricular tachycardia, emphysema, GERD, adrenal adenoma, dementia, history of bladder cancer, aortic valve stenosis.  07/08/2021 vascular ultrasound upper arm-arterial duplex was normal 06/16/2021 left heart cath LAD 40% stenosed, medical management 11/04/2020 Lexiscan no evidence of ischemia 04/03/2020 atrial flutter ablation 03/07/2019 echo EF 60 to 65%, mildly increased LVH, grade 1 DD, mild MR, AR is mild, mild aortic valve stenosis  Evaluated by Dr. Bing Matter on 04/29/2022, blood pressure was on the low side, was started on midodrine by his PCP 3 times per day.  Recently evaluated at the emergency department on 08/12/2022 s/p fall, CT of chest revealed fractures of ribs 4 5 and 6 on the right side, he was offered admission however ultimately decided he wanted to return home.  He presents today accompanied by his wife for follow-up of his atrial fibrillation and nonobstructive CAD.  He has been doing okay, his wife states he appears to be more fatigued than normal.  He offers no formal complaints.  He continues to use tobacco products.  They have plans to see his primary care doctor tomorrow for further evaluation of his fatigue. He denies chest pain, palpitations, dyspnea, pnd, orthopnea, n, v, dizziness, syncope, edema, weight gain, or early satiety.   ROS: Review of Systems  Constitutional:  Positive for malaise/fatigue.  HENT: Negative.    Eyes: Negative.   Respiratory: Negative.    Cardiovascular: Negative.   Gastrointestinal: Negative.   Genitourinary: Negative.   Musculoskeletal: Negative.   Skin: Negative.    Neurological: Negative.   Endo/Heme/Allergies: Negative.   Psychiatric/Behavioral: Negative.       Studies Reviewed: .        Cardiac Studies & Procedures   CARDIAC CATHETERIZATION  CARDIAC CATHETERIZATION 06/16/2021  Narrative   Mid LAD lesion is 40% stenosed.   The left ventricular systolic function is normal.   LV end diastolic pressure is normal.   The left ventricular ejection fraction is 55-65% by visual estimate.  Mild non-obstructive CAD 40% calcified mid LAD stenosis No angiographic evidence of disease in the large, dominant Circumflex or small non-dominant RCA Normal LV systolic function Normal LVEDP  Recommendations: Medical management of mild CAD. His chest pain is non-cardiac. Explore other causes of chest pain.  Findings Coronary Findings Diagnostic  Dominance: Left  Left Anterior Descending Vessel is large. Mid LAD lesion is 40% stenosed. The lesion is calcified.  Left Circumflex Vessel is large.  Right Coronary Artery Vessel is moderate in size.  Intervention  No interventions have been documented.   STRESS TESTS  MYOCARDIAL PERFUSION IMAGING 11/04/2020  Narrative   The study is normal. The study is low risk.   Left ventricular function is normal. Nuclear stress EF: 54 %. The left ventricular ejection fraction is WNL.   Prior study available for comparison from 02/01/2019.   ECHOCARDIOGRAM  ECHOCARDIOGRAM COMPLETE 03/07/2019  Narrative ECHOCARDIOGRAM REPORT    Patient Name:   Wayne Mcguire Date of Exam: 03/07/2019 Medical Rec #:  [id number]      Height:       73.0 in Accession #:    [id number]  Weight:       181.0 lb Date of Birth:  [birth year]      BSA:          2.06 m Patient Age:    86 years       BP:           106/70 mmHg Patient Gender: M              HR:           55 bpm. Exam Location:  [place]  Procedure: 2D Echo, Cardiac Doppler and Color Doppler  Indications:    A-FIB  History:        Patient has no prior history  of Echocardiogram examinations. Arrythmias:Atrial Fibrillation; Signs/Symptoms:Chest Pain.  Sonographer:    Sinda Du RDCS (AE) Referring Phys: [phone] Marveen Reeks KRASOWSKI  IMPRESSIONS   1. Left ventricular ejection fraction, by visual estimation, is 60 to 65%. The left ventricle has normal function. Left ventricular septal wall thickness was mildly increased. Mildly increased left ventricular posterior wall thickness. There is mildly increased left ventricular hypertrophy. 2. Left ventricular diastolic parameters are consistent with Grade I diastolic dysfunction (impaired relaxation). 3. The left ventricle has no regional wall motion abnormalities. 4. Global right ventricle has normal systolic function.The right ventricular size is mildly enlarged. Mildly increased right ventricular wall thickness. 5. Left atrial size was normal. 6. Right atrial size was normal. 7. Mild mitral annular calcification. 8. The mitral valve is normal in structure. Mild mitral valve regurgitation. No evidence of mitral stenosis. 9. The tricuspid valve is normal in structure. 10. The aortic valve is tricuspid. Aortic valve regurgitation is mild. Mild aortic valve stenosis. 11. The pulmonic valve was normal in structure. Pulmonic valve regurgitation is not visualized. 12. There is mild dilatation of the ascending aorta. 13. Mildly elevated pulmonary artery systolic pressure. 14. The inferior vena cava is dilated in size with >50% respiratory variability, suggesting right atrial pressure of 8 mmHg.  FINDINGS Left Ventricle: Left ventricular ejection fraction, by visual estimation, is 60 to 65%. The left ventricle has normal function. The left ventricle has no regional wall motion abnormalities. The left ventricular internal cavity size was the left ventricle is normal in size. Mildly increased left ventricular posterior wall thickness. There is mildly increased left ventricular hypertrophy. Concentric  remodeling left ventricular hypertrophy. Left ventricular diastolic parameters are consistent with Grade I diastolic dysfunction (impaired relaxation). Normal left atrial pressure.  Right Ventricle: The right ventricular size is mildly enlarged. Mildly increased right ventricular wall thickness. Global RV systolic function is has normal systolic function. The tricuspid regurgitant velocity is 2.44 m/s, and with an assumed right atrial pressure of 8 mmHg, the estimated right ventricular systolic pressure is mildly elevated at 31.8 mmHg.  Left Atrium: Left atrial size was normal in size.  Right Atrium: Right atrial size was normal in size  Pericardium: There is no evidence of pericardial effusion.  Mitral Valve: The mitral valve is normal in structure. Mild mitral annular calcification. Mild mitral valve regurgitation. No evidence of mitral valve stenosis by observation.  Tricuspid Valve: The tricuspid valve is normal in structure. Tricuspid valve regurgitation is mild.  Aortic Valve: The aortic valve is tricuspid. . There is mild thickening of the aortic valve. Aortic valve regurgitation is mild. Aortic regurgitation PHT measures 648 msec. Mild aortic stenosis is present. There is mild thickening of the aortic valve. Aortic valve mean gradient measures 9.6 mmHg. Aortic valve peak gradient measures 16.8 mmHg. Aortic valve  area, by VTI measures 1.16 cm.  Pulmonic Valve: The pulmonic valve was normal in structure. Pulmonic valve regurgitation is not visualized. Pulmonic regurgitation is not visualized.  Aorta: The aortic root, ascending aorta and aortic arch are all structurally normal, with no evidence of dilitation or obstruction. There is mild dilatation of the ascending aorta.  Venous: The inferior vena cava is dilated in size with greater than 50% respiratory variability, suggesting right atrial pressure of 8 mmHg.  IAS/Shunts: No atrial level shunt detected by color flow Doppler. There is  no evidence of a patent foramen ovale. No ventricular septal defect is seen or detected. There is no evidence of an atrial septal defect.   LEFT VENTRICLE PLAX 2D LVIDd:         5.44 cm  Diastology LVIDs:         3.35 cm  LV e' lateral:   8.70 cm/s LV PW:         1.14 cm  LV E/e' lateral: 7.4 LV IVS:        1.14 cm  LV e' medial:    6.64 cm/s LVOT diam:     1.90 cm  LV E/e' medial:  9.7 LV SV:         98 ml LV SV Index:   47.59 LVOT Area:     2.84 cm   RIGHT VENTRICLE             IVC RV Basal diam:  4.21 cm     IVC diam: 2.68 cm RV S prime:     16.50 cm/s TAPSE (M-mode): 2.6 cm  LEFT ATRIUM             Index       RIGHT ATRIUM           Index LA diam:        3.20 cm 1.55 cm/m  RA Area:     19.20 cm LA Vol (A2C):   81.6 ml 39.57 ml/m RA Volume:   60.90 ml  29.53 ml/m LA Vol (A4C):   54.1 ml 26.23 ml/m LA Biplane Vol: 68.7 ml 33.31 ml/m AORTIC VALVE AV Area (Vmax):    1.32 cm AV Area (Vmean):   1.23 cm AV Area (VTI):     1.16 cm AV Vmax:           204.80 cm/s AV Vmean:          146.800 cm/s AV VTI:            0.456 m AV Peak Grad:      16.8 mmHg AV Mean Grad:      9.6 mmHg LVOT Vmax:         95.00 cm/s LVOT Vmean:        63.500 cm/s LVOT VTI:          0.186 m LVOT/AV VTI ratio: 0.41 AI PHT:            648 msec  AORTA Ao Root diam: 3.00 cm Ao Asc diam:  3.80 cm  MITRAL VALVE                        TRICUSPID VALVE MV Area (PHT): 2.73 cm             TR Peak grad:   23.8 mmHg MV PHT:        80.62 msec           TR Vmax:  244.00 cm/s MV Decel Time: 278 msec MV E velocity: 64.50 cm/s 103 cm/s  SHUNTS MV A velocity: 93.50 cm/s 70.3 cm/s Systemic VTI:  0.19 m MV E/A ratio:  0.69       1.5       Systemic Diam: 1.90 cm   Norman Herrlich MD Electronically signed by Norman Herrlich MD Signature Date/Time: 03/07/2019/12:26:58 PM    Final    MONITORS  LONG TERM MONITOR (3-14 DAYS) 02/27/2019  Narrative Wayne Mcguire, DOB [birth year], MRN [id number]  [facility]  MONITOR REPORT:    Date of test:                 02/01/2019 Duration of test:           7 days Indication:                    Palpitations, proximal atrial fibrillation Ordering physician:  Georgeanna Lea, MD Referring physician:  Georgeanna Lea, MD   Baseline rhythm: Sinus  Minimum heart rate: 48 BPM.  Average heart rate: 67 BPM.  Maximal heart rate 176 BPM.  Atrial arrhythmia: 57 runs of narrow complex tachycardia.  Fastest episode 13 beats at 167 bpm, longest episode 16.8 seconds at rate of 97.  No clear-cut atrial fibrillation identified  Ventricular arrhythmia: 2 runs of wide-complex tachycardia indicating ventricular tachycardia fastest and longest episode was 8 beats at rate of 176 bpm, asymptomatic  Conduction abnormality: None  Symptoms: Few triggered events associated with PVCs and PACs   Conclusion: Multiple episode of narrow complex tachycardia. 2 runs of ventricular tachycardia with longest and fastest episodes 8 beats at rate of 176 bpm, asymptomatic. Symptomatic PVCs and PACs which were infrequent  Interpreting  cardiologist: Gypsy Balsam, MD Date: 03/04/2019 8:46 PM           Risk Assessment/Calculations:    CHA2DS2-VASc Score = 3   This indicates a 3.2% annual risk of stroke. The patient's score is based upon: CHF History: 0 HTN History: 0 Diabetes History: 0 Stroke History: 0 Vascular Disease History: 1 Age Score: 2 Gender Score: 0            Physical Exam:   VS:  BP 128/60 (BP Location: Left Arm, Patient Position: Sitting, Cuff Size: Normal)   Pulse 79   Ht 6\' 1"  (1.854 m)   Wt 160 lb 3.2 oz (72.7 kg)   SpO2 96%   BMI 21.14 kg/m    Wt Readings from Last 3 Encounters:  09/16/22 160 lb 3.2 oz (72.7 kg)  04/29/22 163 lb (73.9 kg)  12/31/21 164 lb 0.4 oz (74.4 kg)    GEN: Well nourished, well developed in no acute distress NECK: No JVD; No carotid bruits CARDIAC: RRR, no murmurs, rubs, gallops RESPIRATORY:  Clear to  auscultation without rales, wheezing or rhonchi  ABDOMEN: Soft, non-tender, non-distended EXTREMITIES:  No edema; No deformity   ASSESSMENT AND PLAN: .   CAD -nonobstructive CAD per [facility], Stable with no anginal symptoms. No indication for ischemic evaluation.  Currently on Eliquis 5 mg twice daily so he is not on aspirin, continue nitroglycerin as needed.  Paroxysmal atrial fibrillation/atrial flutter-CHA2DS2-VASc score of 3, regular rhythm today, s/p ablation in 2022, continue Eliquis 5 mg twice daily, no indication for dose reduction however wife is unable to get him to take his medications twice daily secondary to dementia.  We discussed changing his Eliquis to Xarelto for once daily dosing however she is not amenable to  this as he has previously had a bad experience on other anticoagulants and does not want to change his medication regimen at this time.  Aortic valve stenosis-mild on most recent echo in 2021, will repeat echocardiogram for surveillance as he has seemingly worsening fatigue over the last few months.  Emphysema/current smoker-is not interested in cessation.       Dispo: CBC, BMET, TSH, echo. Return in 3 months.  Signed, Flossie Dibble, NP

## 2022-09-16 NOTE — Patient Instructions (Signed)
Medication Instructions:  Your physician recommends that you continue on your current medications as directed. Please refer to the Current Medication list given to you today.  *If you need a refill on your cardiac medications before your next appointment, please call your pharmacy*   Lab Work: Your physician recommends that you return for lab work in: Today for BMP, CBC and TSH  If you have labs (blood work) drawn today and your tests are completely normal, you will receive your results only by: MyChart Message (if you have MyChart) OR A paper copy in the mail If you have any lab test that is abnormal or we need to change your treatment, we will call you to review the results.   Testing/Procedures: Your physician has requested that you have an echocardiogram. Echocardiography is a painless test that uses sound waves to create images of your heart. It provides your doctor with information about the size and shape of your heart and how well your heart's chambers and valves are working. This procedure takes approximately one hour. There are no restrictions for this procedure. Please do NOT wear cologne, perfume, aftershave, or lotions (deodorant is allowed). Please arrive 15 minutes prior to your appointment time.    Follow-Up: At [facility], you and your health needs are our priority.  As part of our continuing mission to provide you with exceptional heart care, we have created designated Provider Care Teams.  These Care Teams include your primary Cardiologist (physician) and Advanced Practice Providers (APPs -  Physician Assistants and Nurse Practitioners) who all work together to provide you with the care you need, when you need it.  We recommend signing up for the patient portal called "MyChart".  Sign up information is provided on this After Visit Summary.  MyChart is used to connect with patients for Virtual Visits (Telemedicine).  Patients are able to view lab/test results,  encounter notes, upcoming appointments, etc.  Non-urgent messages can be sent to your provider as well.   To learn more about what you can do with MyChart, go to [email or web address].    Your next appointment:   3 month(s)  Provider:   Gypsy Balsam, MD    Other Instructions

## 2022-09-17 DIAGNOSIS — R5381 Other malaise: Secondary | ICD-10-CM | POA: Diagnosis not present

## 2022-09-17 DIAGNOSIS — Z91199 Patient's noncompliance with other medical treatment and regimen due to unspecified reason: Secondary | ICD-10-CM | POA: Diagnosis not present

## 2022-09-17 DIAGNOSIS — R5383 Other fatigue: Secondary | ICD-10-CM | POA: Diagnosis not present

## 2022-09-17 LAB — CBC WITH DIFFERENTIAL/PLATELET
Basophils Absolute: 0 x10E3/uL (ref 0.0–0.2)
Basos: 1 %
EOS (ABSOLUTE): 0.2 x10E3/uL (ref 0.0–0.4)
Eos: 3 %
Hematocrit: 40.1 % (ref 37.5–51.0)
Hemoglobin: 13.8 g/dL (ref 13.0–17.7)
Immature Grans (Abs): 0 x10E3/uL (ref 0.0–0.1)
Immature Granulocytes: 0 %
Lymphocytes Absolute: 1.3 x10E3/uL (ref 0.7–3.1)
Lymphs: 15 %
MCH: 30.9 pg (ref 26.6–33.0)
MCHC: 34.4 g/dL (ref 31.5–35.7)
MCV: 90 fL (ref 79–97)
Monocytes Absolute: 0.7 x10E3/uL (ref 0.1–0.9)
Monocytes: 8 %
Neutrophils Absolute: 6.2 x10E3/uL (ref 1.4–7.0)
Neutrophils: 73 %
Platelets: 217 x10E3/uL (ref 150–450)
RBC: 4.46 x10E6/uL (ref 4.14–5.80)
RDW: 12.8 % (ref 11.6–15.4)
WBC: 8.4 x10E3/uL (ref 3.4–10.8)

## 2022-09-17 LAB — BASIC METABOLIC PANEL WITH GFR
BUN/Creatinine Ratio: 13 (ref 10–24)
BUN: 13 mg/dL (ref 8–27)
CO2: 22 mmol/L (ref 20–29)
Calcium: 9.8 mg/dL (ref 8.6–10.2)
Chloride: 103 mmol/L (ref 96–106)
Creatinine, Ser: 1.03 mg/dL (ref 0.76–1.27)
Glucose: 146 mg/dL — ABNORMAL HIGH (ref 70–99)
Potassium: 4 mmol/L (ref 3.5–5.2)
Sodium: 140 mmol/L (ref 134–144)
eGFR: 70 mL/min/1.73

## 2022-09-17 LAB — TSH: TSH: 2.14 u[IU]/mL (ref 0.450–4.500)

## 2022-10-12 ENCOUNTER — Ambulatory Visit: Payer: Medicare HMO | Attending: Cardiology

## 2022-10-12 DIAGNOSIS — Z7901 Long term (current) use of anticoagulants: Secondary | ICD-10-CM | POA: Diagnosis not present

## 2022-10-12 DIAGNOSIS — I959 Hypotension, unspecified: Secondary | ICD-10-CM

## 2022-10-12 DIAGNOSIS — Z9889 Other specified postprocedural states: Secondary | ICD-10-CM

## 2022-10-12 DIAGNOSIS — I480 Paroxysmal atrial fibrillation: Secondary | ICD-10-CM

## 2022-10-12 DIAGNOSIS — Z8679 Personal history of other diseases of the circulatory system: Secondary | ICD-10-CM | POA: Diagnosis not present

## 2022-10-12 DIAGNOSIS — I2510 Atherosclerotic heart disease of native coronary artery without angina pectoris: Secondary | ICD-10-CM | POA: Diagnosis not present

## 2022-10-12 DIAGNOSIS — I472 Ventricular tachycardia, unspecified: Secondary | ICD-10-CM

## 2022-10-12 DIAGNOSIS — R0600 Dyspnea, unspecified: Secondary | ICD-10-CM | POA: Diagnosis not present

## 2022-10-12 LAB — ECHOCARDIOGRAM COMPLETE
AR max vel: 1.62 cm2
AV Area VTI: 1.67 cm2
AV Area mean vel: 1.53 cm2
AV Mean grad: 12 mmHg
AV Peak grad: 20.7 mmHg
Ao pk vel: 2.27 m/s
Area-P 1/2: 5.54 cm2
MV VTI: 3.56 cm2
P 1/2 time: 782 ms
S' Lateral: 4.3 cm

## 2022-10-13 ENCOUNTER — Telehealth: Payer: Self-pay | Admitting: *Deleted

## 2022-10-13 DIAGNOSIS — I480 Paroxysmal atrial fibrillation: Secondary | ICD-10-CM

## 2022-10-13 DIAGNOSIS — R9431 Abnormal electrocardiogram [ECG] [EKG]: Secondary | ICD-10-CM

## 2022-10-13 DIAGNOSIS — I2510 Atherosclerotic heart disease of native coronary artery without angina pectoris: Secondary | ICD-10-CM

## 2022-10-13 NOTE — Telephone Encounter (Signed)
-----   Message from Flossie Dibble sent at 10/13/2022  9:43 AM EDT ----- Echo shows heart is not squeezing as well as it previously did. Likely causing the fatigue. Treatment for this is medication, however his BP is low and he is on midodrine to help bring his BP up. He also has bradycardia so we cannot really add a beta blocker at this time either. We could do a lexiscan to see if anything is going on there, but I am not sure his wife would like to do that.

## 2022-10-13 NOTE — Telephone Encounter (Signed)
Wife is returning call. Please advise

## 2022-10-13 NOTE — Telephone Encounter (Signed)
Spoke with pt's wife and she opted to get the [place] scheduled. Ordered the test and will let the front dest staff schedule this test since I couldn't figure out how to do it.

## 2022-10-13 NOTE — Telephone Encounter (Signed)
Left message for pt to call back  °

## 2022-12-17 ENCOUNTER — Ambulatory Visit: Payer: Medicare HMO | Admitting: Cardiology

## 2023-01-12 DIAGNOSIS — H43811 Vitreous degeneration, right eye: Secondary | ICD-10-CM | POA: Diagnosis not present

## 2023-01-12 DIAGNOSIS — H59812 Chorioretinal scars after surgery for detachment, left eye: Secondary | ICD-10-CM | POA: Diagnosis not present

## 2023-01-12 DIAGNOSIS — H3581 Retinal edema: Secondary | ICD-10-CM | POA: Diagnosis not present

## 2023-01-12 DIAGNOSIS — H40052 Ocular hypertension, left eye: Secondary | ICD-10-CM | POA: Diagnosis not present

## 2023-02-04 DIAGNOSIS — R197 Diarrhea, unspecified: Secondary | ICD-10-CM | POA: Diagnosis not present

## 2023-02-04 DIAGNOSIS — Z0000 Encounter for general adult medical examination without abnormal findings: Secondary | ICD-10-CM | POA: Diagnosis not present

## 2023-02-04 DIAGNOSIS — M1990 Unspecified osteoarthritis, unspecified site: Secondary | ICD-10-CM | POA: Diagnosis not present

## 2023-03-21 DIAGNOSIS — L570 Actinic keratosis: Secondary | ICD-10-CM | POA: Diagnosis not present

## 2023-03-21 DIAGNOSIS — C44222 Squamous cell carcinoma of skin of right ear and external auricular canal: Secondary | ICD-10-CM | POA: Diagnosis not present

## 2023-03-21 DIAGNOSIS — L578 Other skin changes due to chronic exposure to nonionizing radiation: Secondary | ICD-10-CM | POA: Diagnosis not present

## 2023-03-25 ENCOUNTER — Telehealth: Payer: Self-pay | Admitting: Cardiology

## 2023-03-25 DIAGNOSIS — I480 Paroxysmal atrial fibrillation: Secondary | ICD-10-CM

## 2023-03-25 NOTE — Telephone Encounter (Signed)
Pt c/o medication issue:  1. Name of Medication: apixaban (ELIQUIS) 5 MG TABS tablet   2. How are you currently taking this medication (dosage and times per day)? 1 tablet daily  3. Are you having a reaction (difficulty breathing--STAT)?   4. What is your medication issue? Wife called to say that it has gone up from $47 to $150 for month supply.  She said they can't afford that.  She wants to know if there is something else he can be on.

## 2023-03-28 NOTE — Telephone Encounter (Signed)
Pt is calling back to check status of this message.

## 2023-03-28 NOTE — Telephone Encounter (Signed)
Spoke with Wayne Mcguire who states that they cannot afford the Eliquis and pt cannot take Xarelto. Advised to fill out for pt assistance but she states that it is to complicated. Advised that this had been sent to Dr. Bing Matter.

## 2023-03-28 NOTE — Telephone Encounter (Signed)
 Left vm to return call.

## 2023-04-04 MED ORDER — APIXABAN 5 MG PO TABS: 5.0000 mg | ORAL_TABLET | Freq: Two times a day (BID) | ORAL | 0 refills | Status: DC

## 2023-04-04 NOTE — Telephone Encounter (Signed)
 Spoke with Wayne Mcguire per DPR who states that she has filled out the pt assistance paperwork and she will bring it by Thursday 04/07/23. Wayne Mcguire would like to pick up samples for 4 weeks until they get a response.

## 2023-04-04 NOTE — Telephone Encounter (Signed)
 Spoke with Wayne Mcguire, notified samples available for pick up.

## 2023-04-04 NOTE — Addendum Note (Signed)
 Addended by: Lani Havlik M on: 04/04/2023 02:09 PM   Modules accepted: Orders

## 2023-04-07 NOTE — Telephone Encounter (Signed)
Patient's wife plans to stop by the office to pick up samples tomorrow.

## 2023-05-02 ENCOUNTER — Telehealth: Payer: Self-pay

## 2023-05-02 NOTE — Telephone Encounter (Signed)
 Spoke with Wayne Mcguire, states her husband decided not to move forward with [employer] application due to it's complicated process.

## 2023-06-17 ENCOUNTER — Other Ambulatory Visit: Payer: Self-pay | Admitting: Cardiology

## 2023-06-17 DIAGNOSIS — I480 Paroxysmal atrial fibrillation: Secondary | ICD-10-CM

## 2023-06-20 DIAGNOSIS — L570 Actinic keratosis: Secondary | ICD-10-CM | POA: Diagnosis not present

## 2023-06-20 DIAGNOSIS — L578 Other skin changes due to chronic exposure to nonionizing radiation: Secondary | ICD-10-CM | POA: Diagnosis not present

## 2023-06-20 DIAGNOSIS — L219 Seborrheic dermatitis, unspecified: Secondary | ICD-10-CM | POA: Diagnosis not present

## 2023-06-20 DIAGNOSIS — L853 Xerosis cutis: Secondary | ICD-10-CM | POA: Diagnosis not present

## 2023-08-24 ENCOUNTER — Other Ambulatory Visit: Payer: Self-pay | Admitting: Cardiology

## 2023-08-24 DIAGNOSIS — I480 Paroxysmal atrial fibrillation: Secondary | ICD-10-CM

## 2023-08-24 NOTE — Telephone Encounter (Signed)
 Prescription refill request for Eliquis  received. Indication: PAF Last office visit: 08/27/22  JINNY Hoover NP Scr: 1.00 on 09/17/22  Epic Age: 89 Weight: 72.7kg  Based on above findings Eliquis  5mg  twice daily is the appropriate dose.  Pt is due for MD appt and labs.  Message sent to schedulers.  Refill approved x 1.

## 2023-09-08 DIAGNOSIS — Z936 Other artificial openings of urinary tract status: Secondary | ICD-10-CM | POA: Diagnosis not present

## 2023-10-18 ENCOUNTER — Other Ambulatory Visit: Payer: Self-pay | Admitting: Cardiology

## 2023-10-18 DIAGNOSIS — I480 Paroxysmal atrial fibrillation: Secondary | ICD-10-CM

## 2023-10-18 NOTE — Telephone Encounter (Signed)
 Prescription refill request for Eliquis  received. Indication: PAF Last office visit: 09/16/22  JINNY Hoover NP Scr: 1.00 on 09/17/22  Epic Age: 89 Weight: 72.7kg  Based on above findings Eliquis  5mg  twice daily is the appropriate dose.  Pt is past due for appt with MD.  Message sent to schedulers. Refill approved x 1 only

## 2023-10-18 NOTE — Telephone Encounter (Signed)
 Refill Request.
